# Patient Record
Sex: Female | Born: 1975 | Race: White | Hispanic: No | Smoking: Current every day smoker
Health system: Southern US, Community
[De-identification: ages and names within clinical notes are randomized; demographics above are authoritative.]

## PROBLEM LIST (undated history)

## (undated) DIAGNOSIS — M542 Cervicalgia: Secondary | ICD-10-CM

## (undated) DIAGNOSIS — N390 Urinary tract infection, site not specified: Secondary | ICD-10-CM

## (undated) DIAGNOSIS — M5410 Radiculopathy, site unspecified: Secondary | ICD-10-CM

## (undated) DIAGNOSIS — G8929 Other chronic pain: Secondary | ICD-10-CM

## (undated) DIAGNOSIS — J189 Pneumonia, unspecified organism: Secondary | ICD-10-CM

## (undated) DIAGNOSIS — F4310 Post-traumatic stress disorder, unspecified: Secondary | ICD-10-CM

## (undated) DIAGNOSIS — F988 Other specified behavioral and emotional disorders with onset usually occurring in childhood and adolescence: Secondary | ICD-10-CM

## (undated) DIAGNOSIS — M1990 Unspecified osteoarthritis, unspecified site: Secondary | ICD-10-CM

## (undated) DIAGNOSIS — M549 Dorsalgia, unspecified: Secondary | ICD-10-CM

## (undated) HISTORY — PX: TUBAL LIGATION: SHX77

## (undated) HISTORY — PX: ABDOMINAL HYSTERECTOMY: SHX81

---

## 2000-12-27 ENCOUNTER — Other Ambulatory Visit: Admission: RE | Admit: 2000-12-27 | Discharge: 2000-12-27 | Payer: Self-pay | Admitting: *Deleted

## 2001-05-12 ENCOUNTER — Emergency Department (HOSPITAL_COMMUNITY): Admission: EM | Admit: 2001-05-12 | Discharge: 2001-05-12 | Payer: Self-pay | Admitting: Emergency Medicine

## 2001-09-08 ENCOUNTER — Emergency Department (HOSPITAL_COMMUNITY): Admission: EM | Admit: 2001-09-08 | Discharge: 2001-09-09 | Payer: Self-pay

## 2001-09-15 ENCOUNTER — Emergency Department (HOSPITAL_COMMUNITY): Admission: EM | Admit: 2001-09-15 | Discharge: 2001-09-15 | Payer: Self-pay | Admitting: Internal Medicine

## 2001-09-15 ENCOUNTER — Encounter: Payer: Self-pay | Admitting: Internal Medicine

## 2001-12-27 ENCOUNTER — Other Ambulatory Visit: Admission: RE | Admit: 2001-12-27 | Discharge: 2001-12-27 | Payer: Self-pay | Admitting: *Deleted

## 2002-01-25 ENCOUNTER — Emergency Department (HOSPITAL_COMMUNITY): Admission: EM | Admit: 2002-01-25 | Discharge: 2002-01-25 | Payer: Self-pay | Admitting: Emergency Medicine

## 2002-02-08 ENCOUNTER — Emergency Department (HOSPITAL_COMMUNITY): Admission: EM | Admit: 2002-02-08 | Discharge: 2002-02-08 | Payer: Self-pay | Admitting: Internal Medicine

## 2002-02-28 ENCOUNTER — Emergency Department (HOSPITAL_COMMUNITY): Admission: EM | Admit: 2002-02-28 | Discharge: 2002-02-28 | Payer: Self-pay | Admitting: *Deleted

## 2002-02-28 ENCOUNTER — Encounter: Payer: Self-pay | Admitting: *Deleted

## 2002-08-23 ENCOUNTER — Emergency Department (HOSPITAL_COMMUNITY): Admission: EM | Admit: 2002-08-23 | Discharge: 2002-08-23 | Payer: Self-pay | Admitting: Emergency Medicine

## 2002-10-17 ENCOUNTER — Other Ambulatory Visit: Admission: RE | Admit: 2002-10-17 | Discharge: 2002-10-17 | Payer: Self-pay | Admitting: Obstetrics and Gynecology

## 2003-03-12 ENCOUNTER — Ambulatory Visit (HOSPITAL_COMMUNITY): Admission: AD | Admit: 2003-03-12 | Discharge: 2003-03-12 | Payer: Self-pay | Admitting: Internal Medicine

## 2003-03-15 ENCOUNTER — Ambulatory Visit (HOSPITAL_COMMUNITY): Admission: AD | Admit: 2003-03-15 | Discharge: 2003-03-15 | Payer: Self-pay | Admitting: Obstetrics and Gynecology

## 2003-03-20 ENCOUNTER — Ambulatory Visit (HOSPITAL_COMMUNITY): Admission: AD | Admit: 2003-03-20 | Discharge: 2003-03-20 | Payer: Self-pay | Admitting: Obstetrics and Gynecology

## 2003-03-26 ENCOUNTER — Ambulatory Visit (HOSPITAL_COMMUNITY): Admission: AD | Admit: 2003-03-26 | Discharge: 2003-03-26 | Payer: Self-pay | Admitting: Obstetrics and Gynecology

## 2003-03-29 ENCOUNTER — Ambulatory Visit (HOSPITAL_COMMUNITY): Admission: AD | Admit: 2003-03-29 | Discharge: 2003-03-29 | Payer: Self-pay | Admitting: Obstetrics and Gynecology

## 2003-04-01 ENCOUNTER — Ambulatory Visit (HOSPITAL_COMMUNITY): Admission: AD | Admit: 2003-04-01 | Discharge: 2003-04-01 | Payer: Self-pay | Admitting: Obstetrics and Gynecology

## 2003-04-03 ENCOUNTER — Inpatient Hospital Stay (HOSPITAL_COMMUNITY): Admission: AD | Admit: 2003-04-03 | Discharge: 2003-04-06 | Payer: Self-pay | Admitting: Obstetrics and Gynecology

## 2003-05-14 ENCOUNTER — Other Ambulatory Visit: Admission: RE | Admit: 2003-05-14 | Discharge: 2003-05-14 | Payer: Self-pay | Admitting: Obstetrics & Gynecology

## 2003-08-21 ENCOUNTER — Ambulatory Visit (HOSPITAL_COMMUNITY): Admission: RE | Admit: 2003-08-21 | Discharge: 2003-08-21 | Payer: Self-pay | Admitting: Internal Medicine

## 2004-01-07 ENCOUNTER — Ambulatory Visit: Payer: Self-pay | Admitting: Family Medicine

## 2004-01-24 ENCOUNTER — Ambulatory Visit (HOSPITAL_COMMUNITY): Admission: RE | Admit: 2004-01-24 | Discharge: 2004-01-24 | Payer: Self-pay | Admitting: Family Medicine

## 2004-02-11 ENCOUNTER — Ambulatory Visit: Payer: Self-pay | Admitting: Family Medicine

## 2004-03-07 ENCOUNTER — Ambulatory Visit (HOSPITAL_COMMUNITY): Admission: RE | Admit: 2004-03-07 | Discharge: 2004-03-07 | Payer: Self-pay | Admitting: Family Medicine

## 2004-03-10 ENCOUNTER — Ambulatory Visit (HOSPITAL_COMMUNITY): Admission: RE | Admit: 2004-03-10 | Discharge: 2004-03-10 | Payer: Self-pay | Admitting: Family Medicine

## 2004-03-11 ENCOUNTER — Ambulatory Visit: Payer: Self-pay | Admitting: Family Medicine

## 2004-06-09 ENCOUNTER — Ambulatory Visit: Payer: Self-pay | Admitting: Family Medicine

## 2004-07-10 ENCOUNTER — Ambulatory Visit: Payer: Self-pay | Admitting: Family Medicine

## 2005-05-28 ENCOUNTER — Ambulatory Visit (HOSPITAL_COMMUNITY): Admission: RE | Admit: 2005-05-28 | Discharge: 2005-05-28 | Payer: Self-pay | Admitting: Obstetrics and Gynecology

## 2006-02-26 ENCOUNTER — Ambulatory Visit: Payer: Self-pay | Admitting: Family Medicine

## 2006-03-04 ENCOUNTER — Encounter: Payer: Self-pay | Admitting: Family Medicine

## 2006-03-04 LAB — CONVERTED CEMR LAB
Albumin: 4.4 g/dL (ref 3.5–5.2)
Alkaline Phosphatase: 85 units/L (ref 39–117)
Basophils Absolute: 0 10*3/uL (ref 0.0–0.1)
CO2: 20 meq/L (ref 19–32)
Chloride: 108 meq/L (ref 96–112)
Creatinine, Ser: 0.62 mg/dL (ref 0.40–1.20)
HDL: 39 mg/dL — ABNORMAL LOW (ref >39)
Hemoglobin: 13.9 g/dL (ref 12.0–15.0)
LDL Cholesterol: 91 mg/dL (ref 0–99)
Lymphocytes Relative: 22 % (ref 12–46)
Monocytes Absolute: 0.4 10*3/uL (ref 0.2–0.7)
Monocytes Relative: 5 % (ref 3–11)
Neutro Abs: 5.5 10*3/uL (ref 1.7–7.7)
Potassium: 4.5 meq/L (ref 3.5–5.3)
RBC: 4.47 M/uL (ref 3.87–5.11)
RDW: 12.5 % (ref 11.5–14.0)
TSH: 1.482 microintl units/mL (ref 0.350–5.50)
Total Bilirubin: 0.4 mg/dL (ref 0.3–1.2)
VLDL: 15 mg/dL (ref 0–40)

## 2006-04-17 ENCOUNTER — Emergency Department (HOSPITAL_COMMUNITY): Admission: EM | Admit: 2006-04-17 | Discharge: 2006-04-17 | Payer: Self-pay | Admitting: Emergency Medicine

## 2006-05-27 ENCOUNTER — Emergency Department (HOSPITAL_COMMUNITY): Admission: EM | Admit: 2006-05-27 | Discharge: 2006-05-27 | Payer: Self-pay | Admitting: Emergency Medicine

## 2006-06-02 ENCOUNTER — Ambulatory Visit: Payer: Self-pay | Admitting: Orthopedic Surgery

## 2006-06-21 ENCOUNTER — Ambulatory Visit: Payer: Self-pay | Admitting: Orthopedic Surgery

## 2006-06-23 ENCOUNTER — Ambulatory Visit (HOSPITAL_COMMUNITY): Admission: RE | Admit: 2006-06-23 | Discharge: 2006-06-23 | Payer: Self-pay | Admitting: Orthopedic Surgery

## 2006-06-30 ENCOUNTER — Ambulatory Visit: Payer: Self-pay | Admitting: Orthopedic Surgery

## 2006-10-23 ENCOUNTER — Emergency Department (HOSPITAL_COMMUNITY): Admission: EM | Admit: 2006-10-23 | Discharge: 2006-10-24 | Payer: Self-pay | Admitting: Emergency Medicine

## 2006-12-28 ENCOUNTER — Encounter: Admission: RE | Admit: 2006-12-28 | Discharge: 2006-12-28 | Payer: Self-pay | Admitting: Family Medicine

## 2007-01-20 ENCOUNTER — Encounter: Payer: Self-pay | Admitting: Family Medicine

## 2007-06-09 ENCOUNTER — Emergency Department (HOSPITAL_COMMUNITY): Admission: EM | Admit: 2007-06-09 | Discharge: 2007-06-09 | Payer: Self-pay | Admitting: Emergency Medicine

## 2007-07-15 ENCOUNTER — Emergency Department (HOSPITAL_COMMUNITY): Admission: EM | Admit: 2007-07-15 | Discharge: 2007-07-15 | Payer: Self-pay | Admitting: Emergency Medicine

## 2007-09-11 ENCOUNTER — Emergency Department (HOSPITAL_COMMUNITY): Admission: EM | Admit: 2007-09-11 | Discharge: 2007-09-11 | Payer: Self-pay | Admitting: Emergency Medicine

## 2007-10-06 ENCOUNTER — Encounter
Admission: RE | Admit: 2007-10-06 | Discharge: 2007-10-10 | Payer: Self-pay | Admitting: Physical Medicine & Rehabilitation

## 2007-10-10 ENCOUNTER — Ambulatory Visit: Payer: Self-pay | Admitting: Physical Medicine & Rehabilitation

## 2008-01-25 ENCOUNTER — Encounter: Payer: Self-pay | Admitting: Family Medicine

## 2008-02-20 ENCOUNTER — Encounter: Payer: Self-pay | Admitting: Family Medicine

## 2008-05-02 ENCOUNTER — Emergency Department (HOSPITAL_COMMUNITY): Admission: EM | Admit: 2008-05-02 | Discharge: 2008-05-02 | Payer: Self-pay | Admitting: Emergency Medicine

## 2008-07-19 ENCOUNTER — Emergency Department (HOSPITAL_COMMUNITY): Admission: EM | Admit: 2008-07-19 | Discharge: 2008-07-19 | Payer: Self-pay | Admitting: Emergency Medicine

## 2008-08-07 ENCOUNTER — Emergency Department (HOSPITAL_COMMUNITY): Admission: EM | Admit: 2008-08-07 | Discharge: 2008-08-07 | Payer: Self-pay | Admitting: Emergency Medicine

## 2008-10-26 ENCOUNTER — Emergency Department (HOSPITAL_COMMUNITY): Admission: EM | Admit: 2008-10-26 | Discharge: 2008-10-26 | Payer: Self-pay | Admitting: Emergency Medicine

## 2008-12-25 ENCOUNTER — Emergency Department (HOSPITAL_COMMUNITY): Admission: EM | Admit: 2008-12-25 | Discharge: 2008-12-25 | Payer: Self-pay | Admitting: Emergency Medicine

## 2009-10-16 ENCOUNTER — Emergency Department (HOSPITAL_COMMUNITY): Admission: EM | Admit: 2009-10-16 | Discharge: 2009-10-16 | Payer: Self-pay | Admitting: Emergency Medicine

## 2009-11-13 ENCOUNTER — Ambulatory Visit (HOSPITAL_COMMUNITY): Admission: RE | Admit: 2009-11-13 | Discharge: 2009-11-13 | Payer: Self-pay | Admitting: Neurology

## 2010-01-02 ENCOUNTER — Emergency Department (HOSPITAL_COMMUNITY)
Admission: EM | Admit: 2010-01-02 | Discharge: 2010-01-03 | Payer: Self-pay | Source: Home / Self Care | Admitting: Emergency Medicine

## 2010-01-22 ENCOUNTER — Emergency Department (HOSPITAL_COMMUNITY)
Admission: EM | Admit: 2010-01-22 | Discharge: 2010-01-22 | Payer: Self-pay | Source: Home / Self Care | Admitting: Emergency Medicine

## 2010-02-18 NOTE — Letter (Signed)
Summary: RPC chart  RPC chart   Imported By: Curtis Sites 08/27/2009 10:53:36  _____________________________________________________________________  External Attachment:    Type:   Image     Comment:   External Document

## 2010-04-22 LAB — CBC
HCT: 36.4 % (ref 36.0–46.0)
Hemoglobin: 12.5 g/dL (ref 12.0–15.0)
RBC: 4 MIL/uL (ref 3.87–5.11)
WBC: 8.8 10*3/uL (ref 4.0–10.5)

## 2010-04-22 LAB — BASIC METABOLIC PANEL
Calcium: 9.1 mg/dL (ref 8.4–10.5)
GFR calc Af Amer: >60 mL/min (ref >60)
GFR calc non Af Amer: >60 mL/min (ref >60)
Potassium: 4.3 mEq/L (ref 3.5–5.1)
Sodium: 139 mEq/L (ref 135–145)

## 2010-04-22 LAB — DIFFERENTIAL
Eosinophils Relative: 2 % (ref 0–5)
Lymphocytes Relative: 12 % (ref 12–46)
Lymphs Abs: 1.1 10*3/uL (ref 0.7–4.0)
Monocytes Absolute: 0.3 10*3/uL (ref 0.1–1.0)
Neutro Abs: 7.3 10*3/uL (ref 1.7–7.7)

## 2010-04-27 LAB — URINALYSIS, ROUTINE W REFLEX MICROSCOPIC
Glucose, UA: NEGATIVE mg/dL
Hgb urine dipstick: NEGATIVE
Leukocytes, UA: NEGATIVE
Specific Gravity, Urine: 1.025 (ref 1.005–1.030)
Urobilinogen, UA: 1 mg/dL (ref 0.0–1.0)

## 2010-04-27 LAB — URINE MICROSCOPIC-ADD ON
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
## 2010-06-06 NOTE — H&P (Signed)
NAME:  Linda Calderon, Linda Calderon                              ACCOUNT NO.:  0   MEDICAL RECORD NO.:  [id number]                     PATIENT TYPE:   LOCATION:                                       FACILITY:   PHYSICIAN:  Lazaro Arms, M.D.                DATE OF BIRTH:  [birth year]   DATE OF ADMISSION:  04/03/2003  DATE OF DISCHARGE:                                HISTORY & PHYSICAL   HISTORY OF PRESENT ILLNESS:  Linda Calderon is a 35 year old white female gravida 7,  para 0 with six pregnancy losses who is admitted to Labor & Delivery for  cervical ripening and induction of labor.  Linda Calderon has been followed for the  last several weeks because of poor fetal growth.  She has been borderline  10th percentile for the last several visits.  However, in this visit we have  seen a decrease in her head-to-abdomen ratio and some fall-off in head  growth.  In addition, she is clearly less than the 10th percentile now in  fetal growth.  At 38 weeks, as a result of this, we are gong to proceed with  cervical ripening and induction of labor.  Her cervix is long, thick and  closed.  The baby is very low but the cervix is very unfavorable and will  not allow passage of Foley.   PAST MEDICAL HISTORY:  Significant for anemia.   PAST SURGICAL HISTORY:  She had a D&C in 1999 by Dr. Lisette Grinder for a  miscarriage.   PAST OB:  As above.   ALLERGIES:  NONE.   MEDICATIONS:  Vitamins and iron.   REVIEW OF SYSTEMS:  Otherwise negative.   Blood type O positive.  Antibody negative.  Serology nonreactive.  Hepatitis  B negative.  HIV negative.  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  She will  undergo a colposcopy once again after six weeks postpartum.  GC and  Chlamydia were negative.  AFP was normal.  Her Group B Strep was negative.  Repeat GC and Chlamydia were normal.  Her 1-hour Glucola was elevated but  her 3-hour GGT was 73, 149, 136, and 114, all values were normal.   PHYSICAL EXAMINATION:  HEENT:  Unremarkable.  THYROID:   Normal.  LUNGS:  Clear.  HEART:  Regular rhythm without murmur, rubs or gallop.  BREASTS:  Deferred.  ABDOMEN:  Fundal height of 31 cm.  CERVIX:  Long, thick and closed with fetal vertex being low.  EXTREMITIES:  Warm with no edema.   IMPRESSION:  1. Intrauterine pregnancy at [redacted] weeks gestation.  2. Intrauterine growth restriction, estimated fetal weight less than 10th     percentile.  3. Unfavorable cervix.   PLAN:  Patient is admitted for cervical ripening with Cervidil to be  followed by Pitocin induction the following morning.  She understands the  indications and agrees with proceeding.  ___________________________________________                                         Lazaro Arms, M.D.   Linda Calderon  D:  04/02/2003  T:  04/02/2003  Job:  [id number]

## 2010-06-06 NOTE — Op Note (Signed)
NAME:  Linda Calderon, Linda Calderon                            ACCOUNT NO.:  [id number]   MEDICAL RECORD NO.:  [id number]                   PATIENT TYPE:  INP   LOCATION:  LDR1                                 FACILITY:  [facility]   PHYSICIAN:  Tilda Burrow, M.D.              DATE OF BIRTH:  [birth year]   DATE OF PROCEDURE:  DATE OF DISCHARGE:                                 OPERATIVE REPORT   DELIVERY SUMMARY   DATE OF DELIVERY:  April 04, 2003 at 2:53 p.m.   ONSET OF LABOR:  April 04, 2003 at 7 a.m.   LENGTH OF FIRST STAGE LABOR:  7 hours and 30 minutes.   LENGTH OF SECOND STAGE LABOR:  18 minutes.   LENGTH OF THIRD STAGE LABOR:  11 minutes.   DELIVERY NOTE:  Linda Calderon had a normal spontaneous vaginal delivery of a viable  female infant with Apgars of 9 and 9 with a weight of 5 pounds 2 ounces over  an intact perineum.  First stage of labor was actively managed with 20 units  of Pitocin in 1000 cc an hour at a rapid rate.   The placenta was delivered spontaneously via Tomasa Blase mechanism, a 3-vessel  cord was noted upon inspection.  Membranes were noted to be intact upon  inspection.  Perineum was intact upon inspection.  Estimated blood loss was  approximately 300 cc.  Infant and mother both stabilized, in good condition,  and transferred out to postpartum unit.     ________________________________________  ___________________________________________  Zerita Boers, N.M.                       Tilda Burrow, M.D.   DL/MEDQ  D:  41/66/0630  T:  04/04/2003  Job:  [id number]   cc:   [facility]

## 2010-06-06 NOTE — Op Note (Signed)
Linda Calderon, Linda Calderon                  ACCOUNT NO.:  [id number]   MEDICAL RECORD NO.:  [id number]          PATIENT TYPE:  AMB   LOCATION:  DAY                           FACILITY:  [facility]   PHYSICIAN:  Tilda Burrow, M.D. DATE OF BIRTH:  1975/11/26   DATE OF PROCEDURE:  05/28/2005  DATE OF DISCHARGE:  05/28/2005                                 OPERATIVE REPORT   PREOPERATIVE DIAGNOSIS:  Elective sterilization.   POSTOPERATIVE DIAGNOSIS:  Elective sterilization.   PROCEDURE:  Laparoscopic tubal sterilization, Falope rings.   SURGEON:  Tilda Burrow, M.D.   ASSISTANT:  None.   ANESTHESIA:  General.   COMPLICATIONS:  None.   FINDINGS:  Thin, filmy adhesions right paratubal area, able to be released.  Otherwise normal exam.   DETAILS OF PROCEDURE:  The patient was taken to the operating room, prepped  and draped in the usual standard fashion with legs in low lithotomy leg  supports after general anesthesia was introduced without difficulty.  The  bladder was in-and-out catheterized and Hulka tenaculum attached to the  cervix for uterine  manipulation.  An infraumbilical, vertical, 1-cm, skin  incision was made as well as a transverse suprapubic 1-cm incision. A Veress  needle was used to achieve pneumoperitoneum through the umbilical incision  while being careful to orient the needle toward the pelvis while elevating  the abdominal wall by manual elevation. Water droplet test was used to  confirm intraperitoneal placement.   Pneumoperitoneum was achieved easily under 8-to-10 mm of intra-abdominal  pressure; and the laparoscopic trocar was introduced, a 5-mm blunt tipped  trocar, under direct visualization using the video camera.  Peritoneal  cavity was entered without difficulty.  Inspection of the anterior surfaces  of the abdominal contents showed no evidence of injury or bleeding.  Attention was directed to the pelvis.  Findings were as described above.   Attention was  first directed to the left fallopian tube which was elevated,  identified to its fimbriated end and grasped in its midportion with Falope  ring applier.  Falope ring applied and then the tube infiltrated with  Marcaine solution 0.25% using a 22-gauge spinal needle percutaneously  applied.   Attention was then directed to the right fallopian tube where a similar  procedure was performed.  Photo documentation of the ring placements was  performed; 120 cc of saline was instilled into the abdomen; deflation of  CO2 performed; instruments removed and subcuticular 4-0 Dexon closure of  skin incisions performed.  The rest of the surgical instruments were  removed; Steri-Strips placed.  The patient allowed to awaken and go to  recovery room in standard fashion.      Tilda Burrow, M.D.  Electronically Signed     JVF/MEDQ  D:  06/09/2005  T:  06/10/2005  Job:  [id number]

## 2010-06-06 NOTE — H&P (Signed)
Linda Calderon, Linda Calderon                  ACCOUNT NO.:  [id number]   MEDICAL RECORD NO.:  [id number]          PATIENT TYPE:  AMB   LOCATION:  DAY                           FACILITY:  [facility]   PHYSICIAN:  Tilda Burrow, M.D. DATE OF BIRTH:  [birth year]   DATE OF ADMISSION:  DATE OF DISCHARGE:  LH                                HISTORY & PHYSICAL   CHIEF COMPLAINT:  Desire for elective permanent sterilization.   HISTORY OF PRESENT ILLNESS:  This is for surgery on May 28, 2005, [facility].  This is a 35 year old female, postpartum since  2005, who is admitted at this time for elective permanent sterilization.  Linda Calderon has decided on permanent sterilization.  She desires to avoid  contraception issues.  She has had breakthrough bleeding on [place] for  several months.  She has been tried on other oral contraceptives  unsuccessfully.  She finds the periods uncomfortable and crampy.  We gave  consideration to endometrial ablation at the same time but this is not an  option for her current Medicaid and the patient is not interested in  proceeding under a self-pay arrangement.  Permanency in procedure is  emphasized and the patient acknowledges understanding.   PAST MEDICAL HISTORY:  Benign.   PAST SURGICAL HISTORY:  1.  D&C, 1999.  2.  Otherwise spontaneous miscarriage x5.  3.  One spontaneous vaginal delivery,2005, without complications.   ALLERGIES:  None.   PHYSICAL EXAMINATION:  GENERAL:  She is a healthy, petite, Caucasian female,  alert, oriented x3.  HEENT:  Pupils equal, round, and reactive.  NECK:  Supple.  Trachea midline.  CHEST:  Clear to auscultation.  ABDOMEN:  Nontender.  EXTERNAL GENITALIA;  Multiparous.  Vaginal exam:  Normal secretions.  Cervix  nontender.  Uterus anteflexed.  Adnexa nontender without masses.   LABORATORY DATA:  GC and Chlamydia culture negative.   PLAN:  Laparoscopic tubal sterilization, Falope's rings, May 10.  2007.      Tilda Burrow, M.D.  Electronically Signed     JVF/MEDQ  D:  05/20/2005  T:  05/20/2005  Job:  [id number]

## 2010-10-30 LAB — URINALYSIS, ROUTINE W REFLEX MICROSCOPIC
Glucose, UA: NEGATIVE
Hgb urine dipstick: NEGATIVE
pH: 6

## 2010-12-27 ENCOUNTER — Emergency Department (HOSPITAL_COMMUNITY): Payer: Medicaid Other

## 2010-12-27 ENCOUNTER — Emergency Department (HOSPITAL_COMMUNITY)
Admission: EM | Admit: 2010-12-27 | Discharge: 2010-12-27 | Disposition: A | Discharge status: Home / Self Care | Payer: Medicaid Other | Attending: Emergency Medicine | Admitting: Emergency Medicine

## 2010-12-27 ENCOUNTER — Encounter: Payer: Self-pay | Admitting: *Deleted

## 2010-12-27 DIAGNOSIS — J111 Influenza due to unidentified influenza virus with other respiratory manifestations: Secondary | ICD-10-CM | POA: Insufficient documentation

## 2010-12-27 DIAGNOSIS — F17200 Nicotine dependence, unspecified, uncomplicated: Secondary | ICD-10-CM | POA: Insufficient documentation

## 2010-12-27 MED ORDER — ACETAMINOPHEN 500 MG PO TABS
1000.0000 mg | ORAL_TABLET | Freq: Once | ORAL | Status: AC
  Administered 2010-12-27: 1000 mg via ORAL
  Filled 2010-12-27: qty 2

## 2010-12-27 MED ORDER — BENZONATATE 100 MG PO CAPS
200.0000 mg | ORAL_CAPSULE | Freq: Once | ORAL | Status: AC
  Administered 2010-12-27: 200 mg via ORAL
  Filled 2010-12-27: qty 2

## 2010-12-27 MED ORDER — OSELTAMIVIR PHOSPHATE 75 MG PO CAPS: 75.0000 mg | ORAL_CAPSULE | Freq: Two times a day (BID) | ORAL | Status: AC

## 2010-12-27 MED ORDER — BENZONATATE 100 MG PO CAPS: 200.0000 mg | ORAL_CAPSULE | Freq: Three times a day (TID) | ORAL | Status: AC | PRN

## 2010-12-27 NOTE — ED Notes (Signed)
Pt a/ox4. Resp even and unlabored. NAD at this time. D/C instructions and rx reviewed with pt. Pt verbalized understanding. Pt ambulated to lobby with steady gate.  

## 2010-12-27 NOTE — ED Notes (Signed)
C/o fever, chills, runny nose and cough

## 2010-12-27 NOTE — ED Notes (Signed)
Pt presents with cough, fever, chills, and runny nose since last night. NAD at this time. Resp even and unlabored at this time. Pt does have a dry cough at this time.

## 2010-12-29 NOTE — ED Provider Notes (Signed)
History     CSN: [id number] Arrival date & time: 12/27/2010  3:25 PM   First MD Initiated Contact with Patient 12/27/10 1525      Chief Complaint  Patient presents with  . Cough  . Fever  . Chills    (Consider location/radiation/quality/duration/timing/severity/associated sxs/prior treatment) Patient is a 35 y.o. female presenting with cough and fever. The history is provided by the patient.  Cough This is a new problem. The current episode started 12 to 24 hours ago. The problem occurs every few minutes. The problem has not changed since onset.The cough is non-productive. The maximum temperature recorded prior to her arrival was 100 to 100.9 F. The fever has been present for less than 1 day. Associated symptoms include chills, rhinorrhea, sore throat and myalgias. Pertinent negatives include no chest pain, no headaches, no shortness of breath and no wheezing. Treatments tried: tylenol. Improvement on treatment: temporary fever reduction.  Patient reports nothing makes symptoms worse. She is a smoker. Her past medical history does not include pneumonia or asthma.  Fever Primary symptoms of the febrile illness include fever, cough and myalgias. Primary symptoms do not include headaches, wheezing, shortness of breath, abdominal pain, nausea, arthralgias or rash.  The myalgias are not associated with weakness.    History reviewed. No pertinent past medical history.  History reviewed. No pertinent past surgical history.  History reviewed. No pertinent family history.  History  Substance Use Topics  . Smoking status: Current Everyday Smoker -- 1.0 packs/day    Types: Cigarettes  . Smokeless tobacco: Not on file  . Alcohol Use: No    OB History    Grav Para Term Preterm Abortions TAB SAB Ect Mult Living                  Review of Systems  Constitutional: Positive for fever and chills.  HENT: Positive for sore throat and rhinorrhea. Negative for congestion and neck pain.     Eyes: Negative.   Respiratory: Positive for cough. Negative for chest tightness, shortness of breath and wheezing.   Cardiovascular: Negative for chest pain.  Gastrointestinal: Negative for nausea and abdominal pain.  Genitourinary: Negative.   Musculoskeletal: Positive for myalgias. Negative for joint swelling and arthralgias.  Skin: Negative.  Negative for rash and wound.  Neurological: Negative for dizziness, weakness, light-headedness, numbness and headaches.  Hematological: Negative.   Psychiatric/Behavioral: Negative.     Allergies  Review of patient's allergies indicates not on file.  Home Medications   Current Outpatient Rx  Name Route Sig Dispense Refill  . BENZONATATE 100 MG PO CAPS Oral Take 2 capsules (200 mg total) by mouth 3 (three) times daily as needed for cough. 21 capsule 0  . OSELTAMIVIR PHOSPHATE 75 MG PO CAPS Oral Take 1 capsule (75 mg total) by mouth 2 (two) times daily. 10 capsule 0    BP 104/72  Pulse 109  Temp(Src) 98.4 F (36.9 C) (Oral)  Resp 18  Ht 5\' 2"  (1.575 m)  Wt 110 lb (49.896 kg)  BMI 20.12 kg/m2  SpO2 99%  LMP 12/13/2010  Physical Exam  Nursing note and vitals reviewed. Constitutional: She is oriented to person, place, and time. She appears well-developed and well-nourished. No distress.       Appears fatigued.  HENT:  Head: Normocephalic and atraumatic.  Right Ear: External ear normal.  Left Ear: External ear normal.  Mouth/Throat: Uvula is midline, oropharynx is clear and moist and mucous membranes are normal. No oropharyngeal exudate.  Eyes: Conjunctivae are normal.  Neck: Normal range of motion.  Cardiovascular: Normal rate, regular rhythm, normal heart sounds and intact distal pulses.        VS noted.  Patient not tachy on exam.  Pulmonary/Chest: Breath sounds normal. She is in respiratory distress. She has no wheezes. She has no rales. She exhibits no tenderness.       Frequent dry cough  Abdominal: Soft. Bowel sounds are  normal. There is no tenderness.  Musculoskeletal: Normal range of motion.  Neurological: She is alert and oriented to person, place, and time.  Skin: Skin is warm and dry.  Psychiatric: She has a normal mood and affect.    ED Course  Procedures (including critical care time)  Labs Reviewed - No data to display Dg Chest 2 View  12/27/2010  *RADIOLOGY REPORT*  Clinical Data: Cough and fever.  CHEST - 2 VIEW  Comparison:  12/25/2008  Findings:  The heart size and mediastinal contours are within normal limits.  Both lungs are clear.  The visualized skeletal structures are unremarkable.  IMPRESSION: No active cardiopulmonary disease.  Original Report Authenticated By: Danae Orleans, M.D.     1. Influenza       MDM  Influenza,  Tessalon for cough,  Tamiflu.  Encouraged rest,  Tylenol,  Fluids.        Candis Musa, PA 12/29/10 1406

## 2011-01-02 NOTE — ED Provider Notes (Signed)
Evaluation and management procedures were performed by the PA/NP under my supervision/collaboration.    Felisa Bonier, MD 01/02/11 1130

## 2011-03-31 ENCOUNTER — Emergency Department (HOSPITAL_COMMUNITY)
Admission: EM | Admit: 2011-03-31 | Discharge: 2011-03-31 | Disposition: A | Discharge status: Home Health | Payer: Medicaid Other | Attending: Emergency Medicine | Admitting: Emergency Medicine

## 2011-03-31 ENCOUNTER — Emergency Department (HOSPITAL_COMMUNITY): Payer: Medicaid Other

## 2011-03-31 ENCOUNTER — Encounter (HOSPITAL_COMMUNITY): Payer: Self-pay

## 2011-03-31 DIAGNOSIS — R1032 Left lower quadrant pain: Secondary | ICD-10-CM | POA: Insufficient documentation

## 2011-03-31 DIAGNOSIS — F17200 Nicotine dependence, unspecified, uncomplicated: Secondary | ICD-10-CM | POA: Insufficient documentation

## 2011-03-31 DIAGNOSIS — N938 Other specified abnormal uterine and vaginal bleeding: Secondary | ICD-10-CM | POA: Insufficient documentation

## 2011-03-31 DIAGNOSIS — N949 Unspecified condition associated with female genital organs and menstrual cycle: Secondary | ICD-10-CM | POA: Insufficient documentation

## 2011-03-31 DIAGNOSIS — R110 Nausea: Secondary | ICD-10-CM | POA: Insufficient documentation

## 2011-03-31 DIAGNOSIS — N898 Other specified noninflammatory disorders of vagina: Secondary | ICD-10-CM | POA: Insufficient documentation

## 2011-03-31 LAB — DIFFERENTIAL
Basophils Absolute: 0 10*3/uL (ref 0.0–0.1)
Basophils Relative: 0 % (ref 0–1)
Eosinophils Absolute: 0.1 10*3/uL (ref 0.0–0.7)
Eosinophils Relative: 1 % (ref 0–5)
Lymphocytes Relative: 20 % (ref 12–46)
Lymphs Abs: 1.6 10*3/uL (ref 0.7–4.0)
Monocytes Absolute: 0.2 10*3/uL (ref 0.1–1.0)
Monocytes Relative: 2 % — ABNORMAL LOW (ref 3–12)
Neutro Abs: 6 10*3/uL (ref 1.7–7.7)
Neutrophils Relative %: 76 % (ref 43–77)

## 2011-03-31 LAB — URINE MICROSCOPIC-ADD ON

## 2011-03-31 LAB — URINALYSIS, ROUTINE W REFLEX MICROSCOPIC
Bilirubin Urine: NEGATIVE
Glucose, UA: NEGATIVE mg/dL
Ketones, ur: NEGATIVE mg/dL
Nitrite: NEGATIVE
Protein, ur: NEGATIVE mg/dL
Specific Gravity, Urine: 1.005 (ref 1.005–1.030)
Urobilinogen, UA: 0.2 mg/dL (ref 0.0–1.0)
pH: 6.5 (ref 5.0–8.0)

## 2011-03-31 LAB — COMPREHENSIVE METABOLIC PANEL
ALT: <5 U/L (ref 0–35)
AST: 11 U/L (ref 0–37)
Albumin: 3.4 g/dL — ABNORMAL LOW (ref 3.5–5.2)
Alkaline Phosphatase: 79 U/L (ref 39–117)
BUN: 7 mg/dL (ref 6–23)
CO2: 25 mEq/L (ref 19–32)
Calcium: 9.4 mg/dL (ref 8.4–10.5)
Chloride: 105 mEq/L (ref 96–112)
Creatinine, Ser: 0.68 mg/dL (ref 0.50–1.10)
GFR calc Af Amer: >90 mL/min (ref >90)
GFR calc non Af Amer: >90 mL/min (ref >90)
Glucose, Bld: 87 mg/dL (ref 70–99)
Potassium: 3.9 mEq/L (ref 3.5–5.1)
Sodium: 138 mEq/L (ref 135–145)
Total Bilirubin: 0.2 mg/dL — ABNORMAL LOW (ref 0.3–1.2)
Total Protein: 7.8 g/dL (ref 6.0–8.3)

## 2011-03-31 LAB — CBC
HCT: 36.2 % (ref 36.0–46.0)
Hemoglobin: 12.5 g/dL (ref 12.0–15.0)
MCH: 30.3 pg (ref 26.0–34.0)
MCHC: 34.5 g/dL (ref 30.0–36.0)
MCV: 87.9 fL (ref 78.0–100.0)
Platelets: 379 10*3/uL (ref 150–400)
RBC: 4.12 MIL/uL (ref 3.87–5.11)
RDW: 12.4 % (ref 11.5–15.5)
WBC: 7.9 10*3/uL (ref 4.0–10.5)

## 2011-03-31 LAB — WET PREP, GENITAL: Yeast Wet Prep HPF POC: NONE SEEN

## 2011-03-31 LAB — RPR: RPR Ser Ql: NONREACTIVE

## 2011-03-31 LAB — PREGNANCY, URINE: Preg Test, Ur: NEGATIVE

## 2011-03-31 MED ORDER — OXYCODONE-ACETAMINOPHEN 5-325 MG PO TABS: 1.0000 | ORAL_TABLET | ORAL | Status: AC | PRN

## 2011-03-31 MED ORDER — ONDANSETRON HCL 4 MG/2ML IJ SOLN
4.0000 mg | Freq: Once | INTRAMUSCULAR | Status: AC
  Administered 2011-03-31: 4 mg via INTRAVENOUS
  Filled 2011-03-31: qty 2

## 2011-03-31 MED ORDER — KETOROLAC TROMETHAMINE 30 MG/ML IJ SOLN
30.0000 mg | Freq: Once | INTRAMUSCULAR | Status: AC
  Administered 2011-03-31: 30 mg via INTRAVENOUS
  Filled 2011-03-31: qty 1

## 2011-03-31 MED ORDER — FENTANYL CITRATE 0.05 MG/ML IJ SOLN
50.0000 ug | Freq: Once | INTRAMUSCULAR | Status: AC
  Administered 2011-03-31: 50 ug via INTRAVENOUS

## 2011-03-31 MED ORDER — FENTANYL CITRATE 0.05 MG/ML IJ SOLN
INTRAMUSCULAR | Status: AC
  Administered 2011-03-31: 50 ug via INTRAVENOUS
  Filled 2011-03-31: qty 2

## 2011-03-31 NOTE — ED Notes (Signed)
Patient transported to Ultrasound 

## 2011-03-31 NOTE — Discharge Instructions (Signed)
Abnormal Uterine Bleeding Abnormal uterine bleeding can have many causes. Some cases are simply treated, while others are more serious. There are several kinds of bleeding that is considered abnormal, including:  Bleeding between periods.   Bleeding after sexual intercourse.   Spotting anytime in the menstrual cycle.   Bleeding heavier or more than normal.   Bleeding after menopause.  CAUSES  There are many causes of abnormal uterine bleeding. It can be present in teenagers, pregnant women, women during their reproductive years, and women who have reached menopause. Your caregiver will look for the more common causes depending on your age, signs, symptoms and your particular circumstance. Most cases are not serious and can be treated. Even the more serious causes, like cancer of the female organs, can be treated adequately if found in the early stages. That is why all types of bleeding should be evaluated and treated as soon as possible. DIAGNOSIS  Diagnosing the cause may take several kinds of tests. Your caregiver may:  Take a complete history of the type of bleeding.   Perform a complete physical exam and Pap smear.   Take an ultrasound on the abdomen showing a picture of the female organs and the pelvis.   Inject dye into the uterus and Fallopian tubes and X-ray them (hysterosalpingogram).   Place fluid in the uterus and do an ultrasound (sonohysterogrqphy).   Take a CT scan to examine the female organs and pelvis.   Take an MRI to examine the female organs and pelvis. There is no X-ray involved with this procedure.   Look inside the uterus with a telescope that has a light at the end (hysteroscopy).   Scrap the inside of the uterus to get tissue to examine (Dilatation and Curettage, D&C).   Look into the pelvis with a telescope that has a light at the end (laparoscopy). This is done through a very small cut (incision) in the abdomen.  TREATMENT  Treatment will depend on the  cause of the abnormal bleeding. It can include:  Doing nothing to allow the problem to take care of itself over time.   Hormone treatment.   Birth control pills.   Treating the medical condition causing the problem.   Laparoscopy.   Major or minor surgery   Destroying the lining of the uterus with electrical currant, laser, freezing or heat (uterine ablation).  HOME CARE INSTRUCTIONS   Follow your caregiver's recommendation on how to treat your problem.   See your caregiver if you missed a menstrual period and think you may be pregnant.   If you are bleeding heavily, count the number of pads/tampons you use and how often you have to change them. Tell this to your caregiver.   Avoid sexual intercourse until the problem is controlled.  SEEK MEDICAL CARE IF:   You have any kind of abnormal bleeding mentioned above.   You feel dizzy at times.   You are 36 years old and have not had a menstrual period yet.  SEEK IMMEDIATE MEDICAL CARE IF:   You pass out.   You are changing pads/tampons every 15 to 30 minutes.   You have belly (abdominal) pain.   You have a temperature of 100 F (37.8 C) or higher.   You become sweaty or weak.   You are passing large blood clots from the vagina.   You start to feel sick to your stomach (nauseous) and throw up (vomit).  Document Released: 01/05/2005 Document Revised: 12/25/2010 Document Reviewed: 05/31/2008 [employer]   Patient Information 2012 [employer]. 

## 2011-03-31 NOTE — ED Provider Notes (Signed)
History     CSN: [id number]  Arrival date & time 03/31/11  1101   First MD Initiated Contact with Patient 03/31/11 1202      Chief Complaint  Patient presents with  . Abdominal Pain  . Vaginal Discharge    (Consider location/radiation/quality/duration/timing/severity/associated sxs/prior treatment) Patient is a 36 y.o. female presenting with abdominal pain and vaginal discharge. The history is provided by the patient.  Abdominal Pain The primary symptoms of the illness include abdominal pain, nausea and vaginal discharge. The primary symptoms of the illness do not include fever, shortness of breath or dysuria. The current episode started 13 to 24 hours ago. The onset of the illness was gradual. The problem has not changed since onset. The pain came on gradually. The abdominal pain has been unchanged since its onset. The abdominal pain is located in the LLQ. The abdominal pain does not radiate. The severity of the abdominal pain is 9/10. The abdominal pain is relieved by nothing. The abdominal pain is exacerbated by certain positions (Palpation makes worse.).  The vaginal discharge was first noticed yesterday. Vaginal discharge is a new problem. The amount of discharge is scant. Color: Manson Passey. The vaginal discharge is not associated with itching, burning or dysuria.  The patient states that she believes she is currently not pregnant. The patient has not had a change in bowel habit. Symptoms associated with the illness do not include chills, anorexia, heartburn, constipation, hematuria or back pain. Associated medical issues comments: She does report a history of ovarian cysts and states today's pain is similar to her last episode approximately 2 years ago..  Vaginal Discharge Associated symptoms include abdominal pain and nausea. Pertinent negatives include no anorexia, arthralgias, chest pain, chills, congestion, fever, headaches, joint swelling, neck pain, numbness, rash, sore throat or  weakness.    History reviewed. No pertinent past medical history.  History reviewed. No pertinent past surgical history.  No family history on file.  History  Substance Use Topics  . Smoking status: Current Everyday Smoker -- 1.0 packs/day    Types: Cigarettes  . Smokeless tobacco: Not on file  . Alcohol Use: No    OB History    Grav Para Term Preterm Abortions TAB SAB Ect Mult Living                  Review of Systems  Constitutional: Negative for fever and chills.  HENT: Negative for congestion, sore throat and neck pain.   Eyes: Negative.   Respiratory: Negative for chest tightness and shortness of breath.   Cardiovascular: Negative for chest pain.  Gastrointestinal: Positive for nausea and abdominal pain. Negative for heartburn, constipation and anorexia.  Genitourinary: Positive for vaginal discharge. Negative for dysuria, hematuria and flank pain.  Musculoskeletal: Negative for back pain, joint swelling and arthralgias.  Skin: Negative.  Negative for itching, rash and wound.  Neurological: Negative for dizziness, weakness, light-headedness, numbness and headaches.  Hematological: Negative.   Psychiatric/Behavioral: Negative.     Allergies  Morphine and related  Home Medications   Current Outpatient Rx  Name Route Sig Dispense Refill  . ALPRAZOLAM 1 MG PO TABS Oral Take 1 mg by mouth 3 (three) times daily.    . AMPHETAMINE-DEXTROAMPHET ER 20 MG PO CP24 Oral Take 20 mg by mouth every morning.    Marland Kitchen DESOGESTREL-ETHINYL ESTRADIOL 0.15-0.02/0.01 MG (21/5) PO TABS Oral Take 1 tablet by mouth daily.    . OXYCODONE-ACETAMINOPHEN 7.5-325 MG PO TABS Oral Take 1 tablet by mouth 3 (three)  times daily.    . OXYCODONE-ACETAMINOPHEN 5-325 MG PO TABS Oral Take 1 tablet by mouth every 4 (four) hours as needed for pain. 15 tablet 0    BP 106/83  Pulse 82  Temp(Src) 97.8 F (36.6 C) (Oral)  Resp 20  Ht 5\' 5"  (1.651 m)  Wt 108 lb (48.988 kg)  BMI 17.97 kg/m2  SpO2 100%   LMP 03/09/2011  Physical Exam  Nursing note and vitals reviewed. Constitutional: She is oriented to person, place, and time. She appears well-developed and well-nourished.  HENT:  Head: Normocephalic and atraumatic.  Eyes: Conjunctivae are normal.  Neck: Normal range of motion.  Cardiovascular: Normal rate, regular rhythm, normal heart sounds and intact distal pulses.   Pulmonary/Chest: Effort normal and breath sounds normal. She has no wheezes.  Abdominal: Soft. Bowel sounds are normal. There is no tenderness.  Genitourinary: Uterus normal. Cervix exhibits discharge. Cervix exhibits no motion tenderness and no friability. Right adnexum displays no mass and no tenderness. Left adnexum displays tenderness. Left adnexum displays no mass. Vaginal discharge found.  Musculoskeletal: Normal range of motion.  Neurological: She is alert and oriented to person, place, and time.  Skin: Skin is warm and dry.  Psychiatric: She has a normal mood and affect.    ED Course  Procedures (including critical care time)  Labs Reviewed  URINALYSIS, ROUTINE W REFLEX MICROSCOPIC - Abnormal; Notable for the following:    Hgb urine dipstick LARGE (*)    Leukocytes, UA TRACE (*)    All other components within normal limits  URINE MICROSCOPIC-ADD ON - Abnormal; Notable for the following:    Squamous Epithelial / LPF FEW (*)    Bacteria, UA FEW (*)    All other components within normal limits  DIFFERENTIAL - Abnormal; Notable for the following:    Monocytes Relative 2 (*)    All other components within normal limits  COMPREHENSIVE METABOLIC PANEL - Abnormal; Notable for the following:    Albumin 3.4 (*)    Total Bilirubin 0.2 (*)    All other components within normal limits  WET PREP, GENITAL - Abnormal; Notable for the following:    WBC, Wet Prep HPF POC RARE (*)    All other components within normal limits  PREGNANCY, URINE  CBC  GC/CHLAMYDIA PROBE AMP, GENITAL  RPR   US Transvaginal  Non-ob  03/31/2011  *RADIOLOGY REPORT*  Clinical Data: Left pelvic pain.  Vaginal discharge.  TRANSABDOMINAL AND TRANSVAGINAL ULTRASOUND OF PELVIS Technique:  Both transabdominal and transvaginal ultrasound examinations of the pelvis were performed. Transabdominal technique was performed for global imaging of the pelvis including uterus, ovaries, adnexal regions, and pelvic cul-de-sac.  Comparison: Report dated 09/15/2001   It was necessary to proceed with endovaginal exam following the transabdominal exam to visualize the the uterus, endometrium and ovaries and better detail.  Findings:  Uterus: Normal in size and appearance  Endometrium: Several tiny calcifications in the fundal portion of the endometrium.  Otherwise, the endometrium has a normal appearance, measuring 6.8 mm in maximum thickness transvaginally.  Right ovary:  Small number of small calcifications.  Otherwise, normal.  Left ovary: Small number of small calcifications.  Otherwise, normal.  Other findings: Prominent uterine veins, especially on the left.  IMPRESSION:  1.  Prominent uterine veins, especially on the left. 2.  Tiny endometrial and ovarian calcifications.  Original Report Authenticated By: Darrol Angel, M.D.   US Pelvis Complete  03/31/2011  *RADIOLOGY REPORT*  Clinical Data: Left pelvic pain.  Vaginal discharge.  TRANSABDOMINAL AND TRANSVAGINAL ULTRASOUND OF PELVIS Technique:  Both transabdominal and transvaginal ultrasound examinations of the pelvis were performed. Transabdominal technique was performed for global imaging of the pelvis including uterus, ovaries, adnexal regions, and pelvic cul-de-sac.  Comparison: Report dated 09/15/2001   It was necessary to proceed with endovaginal exam following the transabdominal exam to visualize the the uterus, endometrium and ovaries and better detail.  Findings:  Uterus: Normal in size and appearance  Endometrium: Several tiny calcifications in the fundal portion of the endometrium.   Otherwise, the endometrium has a normal appearance, measuring 6.8 mm in maximum thickness transvaginally.  Right ovary:  Small number of small calcifications.  Otherwise, normal.  Left ovary: Small number of small calcifications.  Otherwise, normal.  Other findings: Prominent uterine veins, especially on the left.  IMPRESSION:  1.  Prominent uterine veins, especially on the left. 2.  Tiny endometrial and ovarian calcifications.  Original Report Authenticated By: Darrol Angel, M.D.     1. Dysfunctional uterine bleeding       MDM  Referred to Dr. Emelda Fear, patient has seen in the past area however, she did express interest in obtaining a gynecologist in so was encouraged to pursue this as desired.  She was prescribed Percocet for use as needed for pain relief.  Followup per gynecology.  Candis Musa, PA 03/31/11 [phone]

## 2011-03-31 NOTE — ED Provider Notes (Signed)
Medical screening examination/treatment/procedure(s) were performed by non-physician practitioner and as supervising physician I was immediately available for consultation/collaboration.  Dorean Hiebert, MD 03/31/11 1811 

## 2011-03-31 NOTE — ED Notes (Signed)
Pt reports has noticed a brown mucousy vaginal discharge since last night.  C/O pain in LLQ since this morning.  C/O nausea, denies vomiting or diarrhea.

## 2011-04-01 LAB — GC/CHLAMYDIA PROBE AMP, GENITAL: GC Probe Amp, Genital: NEGATIVE

## 2011-12-09 ENCOUNTER — Emergency Department (HOSPITAL_COMMUNITY)
Admission: EM | Admit: 2011-12-09 | Discharge: 2011-12-10 | Disposition: A | Discharge status: Home / Self Care | Payer: Medicaid Other | Attending: Emergency Medicine | Admitting: Emergency Medicine

## 2011-12-09 ENCOUNTER — Encounter (HOSPITAL_COMMUNITY): Payer: Self-pay | Admitting: *Deleted

## 2011-12-09 DIAGNOSIS — Z79899 Other long term (current) drug therapy: Secondary | ICD-10-CM | POA: Insufficient documentation

## 2011-12-09 DIAGNOSIS — F17200 Nicotine dependence, unspecified, uncomplicated: Secondary | ICD-10-CM | POA: Insufficient documentation

## 2011-12-09 DIAGNOSIS — R197 Diarrhea, unspecified: Secondary | ICD-10-CM | POA: Insufficient documentation

## 2011-12-09 DIAGNOSIS — Z90710 Acquired absence of both cervix and uterus: Secondary | ICD-10-CM | POA: Insufficient documentation

## 2011-12-09 DIAGNOSIS — R109 Unspecified abdominal pain: Secondary | ICD-10-CM | POA: Insufficient documentation

## 2011-12-09 DIAGNOSIS — R112 Nausea with vomiting, unspecified: Secondary | ICD-10-CM | POA: Insufficient documentation

## 2011-12-09 DIAGNOSIS — R6883 Chills (without fever): Secondary | ICD-10-CM | POA: Insufficient documentation

## 2011-12-09 MED ORDER — SODIUM CHLORIDE 0.9 % IV BOLUS (SEPSIS)
1000.0000 mL | Freq: Once | INTRAVENOUS | Status: AC
  Administered 2011-12-09: 1000 mL via INTRAVENOUS

## 2011-12-09 MED ORDER — SODIUM CHLORIDE 0.9 % IV BOLUS (SEPSIS)
1000.0000 mL | Freq: Once | INTRAVENOUS | Status: AC
  Administered 2011-12-10: 1000 mL via INTRAVENOUS

## 2011-12-09 MED ORDER — KETOROLAC TROMETHAMINE 30 MG/ML IJ SOLN
30.0000 mg | Freq: Once | INTRAMUSCULAR | Status: AC
  Administered 2011-12-09: 30 mg via INTRAVENOUS
  Filled 2011-12-09: qty 1

## 2011-12-09 MED ORDER — ONDANSETRON HCL 4 MG/2ML IJ SOLN
4.0000 mg | Freq: Once | INTRAMUSCULAR | Status: AC
  Administered 2011-12-09: 4 mg via INTRAVENOUS
  Filled 2011-12-09: qty 2

## 2011-12-09 NOTE — ED Provider Notes (Addendum)
History   This chart was scribed for Vida Roller, MD by Sofie Rower, ED Scribe. The patient was seen in room [facility] and the patient's care was started at 10:56PM.     CSN: [id number]  Arrival date & time 12/09/11  2207   First MD Initiated Contact with Patient 12/09/11 2256      Chief Complaint  Patient presents with  . Emesis    (Consider location/radiation/quality/duration/timing/severity/associated sxs/prior treatment) The history is provided by the patient. No language interpreter was used.    Linda Calderon is a 36 y.o. female , who presents to the Emergency Department complaining of  sudden, Persistent, emesis (X 7), onset today (12/09/11).  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 She denies suspicious food ingestions..   The pt denies rash and any other chronic medical conditions.  The pt is a current everyday smoker (1.0 packs/day), however she does not drink alcohol.      History reviewed. No pertinent past medical history.  Past Surgical History  Procedure Date  . Abdominal hysterectomy     History reviewed. No pertinent family history.  History  Substance Use Topics  . Smoking status: Current Every Day Smoker -- 1.0 packs/day    Types: Cigarettes  . Smokeless tobacco: Not on file  . Alcohol Use: No    OB History    Grav Para Term Preterm Abortions TAB SAB Ect Mult Living                  Review of Systems  Constitutional: Positive for chills. Negative for fatigue.  HENT: Negative for congestion, sore throat and rhinorrhea.   Eyes: Negative for visual disturbance.  Respiratory: Negative for cough and shortness of breath.   Cardiovascular: Negative for chest pain and leg swelling.  Gastrointestinal: Positive for nausea, vomiting, abdominal pain  and diarrhea. Negative for blood in stool.  Genitourinary: Negative for dysuria.  Musculoskeletal: Negative for myalgias and back pain.  Skin: Negative for rash.  Neurological: Negative for dizziness and headaches.  Hematological: Does not bruise/bleed easily.  All other systems reviewed and are negative.    Allergies  Morphine and related  Home Medications   Current Outpatient Rx  Name  Route  Sig  Dispense  Refill  . ALPRAZOLAM 1 MG PO TABS   Oral   Take 1 mg by mouth 3 (three) times daily.         . AMPHETAMINE-DEXTROAMPHET ER 20 MG PO CP24   Oral   Take 20 mg by mouth every morning.          . OXYCODONE-ACETAMINOPHEN 7.5-325 MG PO TABS   Oral   Take 1 tablet by mouth 3 (three) times daily.         Marland Kitchen NAPROXEN 500 MG PO TABS   Oral   Take 1 tablet (500 mg total) by mouth 2 (two) times daily with a meal.   30 tablet   0   . ONDANSETRON 4 MG PO TBDP   Oral   Take 1 tablet (4 mg total) by mouth every 8 (eight) hours as needed for nausea.   10 tablet   0   . PROMETHAZINE HCL 25 MG PO TABS   Oral   Take 1 tablet (25 mg total) by mouth every 6 (six)  hours as needed for nausea.   12 tablet   0     BP 112/78  Pulse 90  Temp 97.3 F (36.3 C) (Oral)  Resp 20  Ht 5\' 4"  (1.626 m)  Wt 108 lb (48.988 kg)  BMI 18.54 kg/m2  SpO2 100%  LMP 03/09/2011  Physical Exam  Nursing note and vitals reviewed. Constitutional: She is oriented to person, place, and time. She appears well-developed and well-nourished. No distress.  HENT:  Head: Atraumatic.  Nose: Nose normal.       Dry mucus membranes.   Eyes: EOM are normal.  Cardiovascular: Normal rate, regular rhythm and normal heart sounds.   Pulmonary/Chest: Effort normal and breath sounds normal.  Abdominal: Soft. Bowel sounds are normal. There is tenderness ( mild left-sided tenderness, no peritoneal signs, no guarding, no masses or hepatosplenomegaly).        No peritoneal signs  Musculoskeletal: Normal range  of motion.  Neurological: She is alert and oriented to person, place, and time.  Skin: Skin is warm and dry. No rash noted.  Psychiatric: She has a normal mood and affect. Her behavior is normal.    ED Course  Procedures (including critical care time)  DIAGNOSTIC STUDIES: Oxygen Saturation is 100% on room air, normal by my interpretation.    COORDINATION OF CARE:   11:11 PM- Treatment plan concerning management of gastroenteritis discussed with patient. Pt agrees with treatment.     Labs Reviewed - No data to display No results found.   1. Nausea vomiting and diarrhea       MDM  The patient appears to be ill with a gastrointestinal illness likely a gastroenteritis. Her vital signs are normal, please see below. There is no indication for laboratory workup at this time. She has been given Zofran and 2 L of IV fluids and feels significantly better. She will be discharged home with Zofran, Phenergan and Naprosyn.  shehas been given resources for followup and appears stable at this time.  Filed Vitals:   12/10/11 0049  BP: 104/78  Pulse: 88  Temp:   Resp:        I personally performed the services described in this documentation, which was scribed in my presence. The recorded information has been reviewed and is accurate.      Vida Roller, MD 12/10/11 7829  Vida Roller, MD 12/10/11 [phone]

## 2011-12-09 NOTE — ED Notes (Signed)
NVD since 7 am,  abd pain

## 2011-12-09 NOTE — ED Notes (Signed)
Pt c/o lower abdominal pain and NVD since 7pm tonight. Pt describes pain as "cramping and turning". Pt states she feels weak. Pt denies blood in stool or vomit.

## 2011-12-10 MED ORDER — NAPROXEN 500 MG PO TABS: 500.0000 mg | ORAL_TABLET | Freq: Two times a day (BID) | ORAL | Status: DC

## 2011-12-10 MED ORDER — ONDANSETRON 4 MG PO TBDP: 4.0000 mg | ORAL_TABLET | Freq: Three times a day (TID) | ORAL | Status: DC | PRN

## 2011-12-10 MED ORDER — PROMETHAZINE HCL 25 MG PO TABS: 25.0000 mg | ORAL_TABLET | Freq: Four times a day (QID) | ORAL | Status: DC | PRN

## 2012-07-24 ENCOUNTER — Emergency Department (HOSPITAL_COMMUNITY)
Admission: EM | Admit: 2012-07-24 | Discharge: 2012-07-25 | Disposition: A | Discharge status: Home / Self Care | Payer: Medicaid Other | Attending: Emergency Medicine | Admitting: Emergency Medicine

## 2012-07-24 ENCOUNTER — Encounter (HOSPITAL_COMMUNITY): Payer: Self-pay | Admitting: Emergency Medicine

## 2012-07-24 DIAGNOSIS — IMO0001 Reserved for inherently not codable concepts without codable children: Secondary | ICD-10-CM | POA: Insufficient documentation

## 2012-07-24 DIAGNOSIS — Z79899 Other long term (current) drug therapy: Secondary | ICD-10-CM | POA: Insufficient documentation

## 2012-07-24 DIAGNOSIS — J020 Streptococcal pharyngitis: Secondary | ICD-10-CM | POA: Insufficient documentation

## 2012-07-24 DIAGNOSIS — R52 Pain, unspecified: Secondary | ICD-10-CM | POA: Insufficient documentation

## 2012-07-24 DIAGNOSIS — R059 Cough, unspecified: Secondary | ICD-10-CM | POA: Insufficient documentation

## 2012-07-24 DIAGNOSIS — R5381 Other malaise: Secondary | ICD-10-CM | POA: Insufficient documentation

## 2012-07-24 DIAGNOSIS — R5383 Other fatigue: Secondary | ICD-10-CM | POA: Insufficient documentation

## 2012-07-24 DIAGNOSIS — R05 Cough: Secondary | ICD-10-CM | POA: Insufficient documentation

## 2012-07-24 DIAGNOSIS — F17200 Nicotine dependence, unspecified, uncomplicated: Secondary | ICD-10-CM | POA: Insufficient documentation

## 2012-07-24 DIAGNOSIS — F988 Other specified behavioral and emotional disorders with onset usually occurring in childhood and adolescence: Secondary | ICD-10-CM | POA: Insufficient documentation

## 2012-07-24 HISTORY — DX: Other specified behavioral and emotional disorders with onset usually occurring in childhood and adolescence: F98.8

## 2012-07-24 HISTORY — DX: Post-traumatic stress disorder, unspecified: F43.10

## 2012-07-24 NOTE — ED Notes (Signed)
Patient c/o fever and sore throat that started yesterday.   States also coughing. Denies nasal congestion.

## 2012-07-25 MED ORDER — PENICILLIN G BENZATHINE 1200000 UNIT/2ML IM SUSP
1.2000 10*6.[IU] | Freq: Once | INTRAMUSCULAR | Status: AC
  Administered 2012-07-25: 1.2 10*6.[IU] via INTRAMUSCULAR
  Filled 2012-07-25: qty 2

## 2012-07-25 MED ORDER — HYDROCODONE-ACETAMINOPHEN 7.5-325 MG/15ML PO SOLN: 10.0000 mL | Freq: Four times a day (QID) | ORAL | Status: DC | PRN

## 2012-07-25 MED ORDER — IBUPROFEN 800 MG PO TABS: 800.0000 mg | ORAL_TABLET | Freq: Three times a day (TID) | ORAL | Status: DC

## 2012-07-25 NOTE — ED Provider Notes (Signed)
History    CSN: [id number] Arrival date & time 07/24/12  2300  First MD Initiated Contact with Patient 07/24/12 2331     Chief Complaint  Patient presents with  . Fever  . Sore Throat   (Consider location/radiation/quality/duration/timing/severity/associated sxs/prior Treatment) HPI Comments: Linda Calderon is a 37 y.o. female who presents to the Emergency Department complaining of fever, generalized body aches and sore throat.  Symptoms began yesterday.  Also reports occasional non-productive cough.  Took tylenol earlier without relief.  She denies abdominal pain, vomiting, rash, recent tick bite, neck pain or stiffness or shortness of breath  Patient is a 37 y.o. female presenting with fever and pharyngitis. The history is provided by the patient.  Fever Associated symptoms: chills, cough, myalgias and sore throat   Associated symptoms: no confusion, no congestion, no dysuria, no headaches, no nausea, no rash, no rhinorrhea and no vomiting   Sore Throat Associated symptoms include chills, coughing, fatigue, a fever, myalgias and a sore throat. Pertinent negatives include no abdominal pain, congestion, headaches, nausea, neck pain, numbness, rash, vomiting or weakness.   Past Medical History  Diagnosis Date  . ADD (attention deficit disorder)   . PTSD (post-traumatic stress disorder)    Past Surgical History  Procedure Laterality Date  . Abdominal hysterectomy     No family history on file. History  Substance Use Topics  . Smoking status: Current Every Day Smoker -- 1.00 packs/day    Types: Cigarettes  . Smokeless tobacco: Not on file  . Alcohol Use: Yes   OB History   Grav Para Term Preterm Abortions TAB SAB Ect Mult Living                 Review of Systems  Constitutional: Positive for fever, chills and fatigue. Negative for activity change and appetite change.  HENT: Positive for sore throat. Negative for congestion, facial swelling, rhinorrhea, trouble swallowing,  neck pain, neck stiffness and sinus pressure.   Eyes: Negative for visual disturbance.  Respiratory: Positive for cough. Negative for chest tightness, shortness of breath, wheezing and stridor.   Gastrointestinal: Negative for nausea, vomiting and abdominal pain.  Genitourinary: Negative for dysuria.  Musculoskeletal: Positive for myalgias.  Skin: Negative.  Negative for color change and rash.  Neurological: Negative for dizziness, weakness, numbness and headaches.  Hematological: Negative for adenopathy.  Psychiatric/Behavioral: Negative for confusion.  All other systems reviewed and are negative.    Allergies  Morphine and related  Home Medications   Current Outpatient Rx  Name  Route  Sig  Dispense  Refill  . ALPRAZolam (XANAX) 1 MG tablet   Oral   Take 1 mg by mouth 3 (three) times daily.         Marland Kitchen amphetamine-dextroamphetamine (ADDERALL XR) 20 MG 24 hr capsule   Oral   Take 20 mg by mouth every morning.          . naproxen (NAPROSYN) 500 MG tablet   Oral   Take 1 tablet (500 mg total) by mouth 2 (two) times daily with a meal.   30 tablet   0   . ondansetron (ZOFRAN ODT) 4 MG disintegrating tablet   Oral   Take 1 tablet (4 mg total) by mouth every 8 (eight) hours as needed for nausea.   10 tablet   0   . oxyCODONE-acetaminophen (PERCOCET) 7.5-325 MG per tablet   Oral   Take 1 tablet by mouth 3 (three) times daily.         Marland Kitchen  promethazine (PHENERGAN) 25 MG tablet   Oral   Take 1 tablet (25 mg total) by mouth every 6 (six) hours as needed for nausea.   12 tablet   0    BP 119/77  Pulse 100  Temp(Src) 99 F (37.2 C) (Oral)  Resp 18  Ht 5\' 4"  (1.626 m)  Wt 104 lb (47.174 kg)  BMI 17.84 kg/m2  SpO2 99%  LMP 03/09/2011 Physical Exam  Nursing note and vitals reviewed. Constitutional: She is oriented to person, place, and time. She appears well-developed and well-nourished. No distress.  HENT:  Head: Normocephalic and atraumatic. No trismus in the  jaw.  Right Ear: Tympanic membrane and ear canal normal.  Left Ear: Tympanic membrane and ear canal normal.  Mouth/Throat: Uvula is midline and mucous membranes are normal. No edematous. Posterior oropharyngeal edema and posterior oropharyngeal erythema present. No oropharyngeal exudate or tonsillar abscesses.  Erythema and edema of the bilateral tonsils.  Uvula is midline.  No exudates, no PTA.  Neck: Normal range of motion and phonation normal. Neck supple. No Brudzinski's sign and no Kernig's sign noted.  Cardiovascular: Normal rate, regular rhythm, normal heart sounds and intact distal pulses.   No murmur heard. Pulmonary/Chest: Effort normal and breath sounds normal. No respiratory distress.  Abdominal: Soft. She exhibits no distension. There is no tenderness. There is no rebound and no guarding.  Musculoskeletal: Normal range of motion.  Lymphadenopathy:    She has cervical adenopathy.       Right cervical: Superficial cervical adenopathy present.       Left cervical: Superficial cervical adenopathy present.  Neurological: She is alert and oriented to person, place, and time. She exhibits normal muscle tone. Coordination normal.  Skin: Skin is warm and dry.    ED Course  Procedures (including critical care time) Labs Reviewed  RAPID STREP SCREEN - Abnormal; Notable for the following:    Streptococcus, Group A Screen (Direct) POSITIVE (*)    All other components within normal limits     MDM  Patient is non-toxic appearing.  Airway patent.  Handles secretions well.  Bicillin LA given in dept.  VSS.  Pt appears stable for discharge.  Agrees to rest, fluids, ibuprofen for fever and f/u with her PMD if needed  Nayden Czajka L. Trisha Mangle, PA-C 07/25/12 1610

## 2012-07-30 NOTE — ED Provider Notes (Signed)
Medical screening examination/treatment/procedure(s) were performed by non-physician practitioner and as supervising physician I was immediately available for consultation/collaboration.  Nicoletta Dress. Colon Branch, MD 07/30/12 0500

## 2012-09-12 DIAGNOSIS — M5431 Sciatica, right side: Secondary | ICD-10-CM | POA: Insufficient documentation

## 2012-09-12 DIAGNOSIS — G8929 Other chronic pain: Secondary | ICD-10-CM | POA: Insufficient documentation

## 2012-11-13 ENCOUNTER — Encounter (HOSPITAL_COMMUNITY): Payer: Self-pay | Admitting: Emergency Medicine

## 2012-11-13 ENCOUNTER — Emergency Department (HOSPITAL_COMMUNITY)
Admission: EM | Admit: 2012-11-13 | Discharge: 2012-11-13 | Disposition: A | Discharge status: Home / Self Care | Payer: Medicaid Other | Attending: Emergency Medicine | Admitting: Emergency Medicine

## 2012-11-13 DIAGNOSIS — K921 Melena: Secondary | ICD-10-CM | POA: Insufficient documentation

## 2012-11-13 DIAGNOSIS — R109 Unspecified abdominal pain: Secondary | ICD-10-CM

## 2012-11-13 DIAGNOSIS — F17200 Nicotine dependence, unspecified, uncomplicated: Secondary | ICD-10-CM | POA: Insufficient documentation

## 2012-11-13 DIAGNOSIS — F988 Other specified behavioral and emotional disorders with onset usually occurring in childhood and adolescence: Secondary | ICD-10-CM | POA: Insufficient documentation

## 2012-11-13 DIAGNOSIS — R1110 Vomiting, unspecified: Secondary | ICD-10-CM | POA: Insufficient documentation

## 2012-11-13 DIAGNOSIS — Z90710 Acquired absence of both cervix and uterus: Secondary | ICD-10-CM | POA: Insufficient documentation

## 2012-11-13 DIAGNOSIS — R1084 Generalized abdominal pain: Secondary | ICD-10-CM | POA: Insufficient documentation

## 2012-11-13 DIAGNOSIS — R42 Dizziness and giddiness: Secondary | ICD-10-CM | POA: Insufficient documentation

## 2012-11-13 DIAGNOSIS — R197 Diarrhea, unspecified: Secondary | ICD-10-CM | POA: Insufficient documentation

## 2012-11-13 DIAGNOSIS — Z79899 Other long term (current) drug therapy: Secondary | ICD-10-CM | POA: Insufficient documentation

## 2012-11-13 LAB — COMPREHENSIVE METABOLIC PANEL
ALT: 9 U/L (ref 0–35)
Albumin: 3.9 g/dL (ref 3.5–5.2)
Alkaline Phosphatase: 74 U/L (ref 39–117)
BUN: 10 mg/dL (ref 6–23)
Calcium: 9.7 mg/dL (ref 8.4–10.5)
GFR calc Af Amer: >90 mL/min (ref >90)
Glucose, Bld: 99 mg/dL (ref 70–99)
Potassium: 4.1 mEq/L (ref 3.5–5.1)
Sodium: 138 mEq/L (ref 135–145)
Total Protein: 7.5 g/dL (ref 6.0–8.3)

## 2012-11-13 LAB — URINALYSIS, ROUTINE W REFLEX MICROSCOPIC
Bilirubin Urine: NEGATIVE
Hgb urine dipstick: NEGATIVE
Ketones, ur: NEGATIVE mg/dL
Nitrite: NEGATIVE
Protein, ur: NEGATIVE mg/dL
Specific Gravity, Urine: 1.01 (ref 1.005–1.030)
Urobilinogen, UA: 0.2 mg/dL (ref 0.0–1.0)

## 2012-11-13 LAB — CBC WITH DIFFERENTIAL/PLATELET
Basophils Relative: 0 % (ref 0–1)
Eosinophils Absolute: 0.1 10*3/uL (ref 0.0–0.7)
Eosinophils Relative: 1 % (ref 0–5)
MCH: 31.6 pg (ref 26.0–34.0)
MCHC: 34.3 g/dL (ref 30.0–36.0)
MCV: 92 fL (ref 78.0–100.0)
Neutrophils Relative %: 85 % — ABNORMAL HIGH (ref 43–77)
Platelets: 340 10*3/uL (ref 150–400)
RDW: 12.4 % (ref 11.5–15.5)

## 2012-11-13 LAB — LIPASE, BLOOD: Lipase: 23 U/L (ref 11–59)

## 2012-11-13 MED ORDER — ONDANSETRON 8 MG PO TBDP: ORAL_TABLET | ORAL | Status: DC

## 2012-11-13 MED ORDER — ONDANSETRON HCL 4 MG/2ML IJ SOLN
4.0000 mg | Freq: Once | INTRAMUSCULAR | Status: AC
  Administered 2012-11-13: 4 mg via INTRAVENOUS
  Filled 2012-11-13: qty 2

## 2012-11-13 MED ORDER — METOCLOPRAMIDE HCL 10 MG PO TABS: 10.0000 mg | ORAL_TABLET | Freq: Four times a day (QID) | ORAL | Status: DC | PRN

## 2012-11-13 MED ORDER — SODIUM CHLORIDE 0.9 % IV BOLUS (SEPSIS)
2000.0000 mL | Freq: Once | INTRAVENOUS | Status: AC
  Administered 2012-11-13: 1000 mL via INTRAVENOUS
  Administered 2012-11-13: 2000 mL via INTRAVENOUS

## 2012-11-13 MED ORDER — FENTANYL CITRATE 0.05 MG/ML IJ SOLN
100.0000 ug | Freq: Once | INTRAMUSCULAR | Status: AC
  Administered 2012-11-13: 100 ug via INTRAVENOUS
  Filled 2012-11-13: qty 2

## 2012-11-13 MED ORDER — HYDROCODONE-ACETAMINOPHEN 5-325 MG PO TABS: 2.0000 | ORAL_TABLET | Freq: Four times a day (QID) | ORAL | Status: DC | PRN

## 2012-11-13 NOTE — ED Provider Notes (Signed)
CSN: [id number]     Arrival date & time 11/13/12  1009 History  This chart was scribed for Hurman Horn, MD by Blanchard Kelch, ED Scribe. The patient was seen in room APA04/APA04. Patient's care was started at 10:24 AM.     Chief Complaint  Patient presents with  . Abdominal Pain  . Dizziness    HPI  HPI Comments: Linda Calderon is a 37 y.o. female with a history of hysterectomy who presents to the Emergency Department complaining of intermittent abdominal pain with multiple spells of non-bloody vomiting and diarrhea that began yesterday. She reports some associated light-headedness. Last night there were small amounts of bright red blood present around the vomiting and diarrhea. There have been a few episodes today of non-bloody vomiting and diarrhea and continued intermittent abdominal pain. She is otherwise generally healthy.     Past Medical History  Diagnosis Date  . ADD (attention deficit disorder)   . PTSD (post-traumatic stress disorder)    Past Surgical History  Procedure Laterality Date  . Abdominal hysterectomy     History reviewed. No pertinent family history. History  Substance Use Topics  . Smoking status: Current Every Day Smoker -- 1.00 packs/day    Types: Cigarettes  . Smokeless tobacco: Not on file  . Alcohol Use: Yes     Comment: once a month/liquor   OB History   Grav Para Term Preterm Abortions TAB SAB Ect Mult Living                 Review of Systems 10 Systems reviewed and are negative for acute change except as noted in the HPI  Allergies  Morphine and related  Home Medications   Current Outpatient Rx  Name  Route  Sig  Dispense  Refill  . ALPRAZolam (XANAX) 1 MG tablet   Oral   Take 1 mg by mouth 3 (three) times daily.         Marland Kitchen amphetamine-dextroamphetamine (ADDERALL) 20 MG tablet   Oral   Take 20 mg by mouth daily.         . Aspirin-Salicylamide-Caffeine (BC HEADACHE POWDER PO)   Oral   Take 1 packet by mouth 2 (two) times  daily as needed (pain).         Marland Kitchen HYDROcodone-acetaminophen (NORCO) 5-325 MG per tablet   Oral   Take 2 tablets by mouth every 6 (six) hours as needed for pain.   6 tablet   0   . metoCLOPramide (REGLAN) 10 MG tablet   Oral   Take 1 tablet (10 mg total) by mouth every 6 (six) hours as needed (nausea/headache).   6 tablet   0   . ondansetron (ZOFRAN ODT) 8 MG disintegrating tablet      8mg  ODT q4 hours prn nausea   4 tablet   0    Triage Vitals: BP 122/83  Pulse 87  Temp(Src) 97.9 F (36.6 C) (Oral)  Resp 19  Ht 5\' 4"  (1.626 m)  Wt 104 lb (47.174 kg)  BMI 17.84 kg/m2  SpO2 98%  LMP 03/09/2011  Physical Exam  Nursing note and vitals reviewed. Constitutional:  Awake, alert, nontoxic appearance.  HENT:  Head: Atraumatic.  Eyes: Right eye exhibits no discharge. Left eye exhibits no discharge.  Neck: Neck supple.  Cardiovascular: Normal rate and regular rhythm.   No murmur heard. Pulmonary/Chest: Effort normal and breath sounds normal. No respiratory distress. She has no wheezes. She has no rales. She exhibits no tenderness.  Abdominal: Soft. Bowel sounds are normal. She exhibits no distension and no mass. There is tenderness. There is no rebound and no guarding.  Mild diffuse abdominal tenderness.  Chaperone present. Anoscope reveals rectal mucosal tear, superficial. Brown stool.  Musculoskeletal: She exhibits no tenderness.  Baseline ROM, no obvious new focal weakness.  Neurological: She is alert.  Mental status and motor strength appears baseline for patient and situation.  Skin: No rash noted.  Psychiatric: She has a normal mood and affect.    ED Course  Procedures (including critical care time) Recheck minimal diffuse Abd tenderness, Pt feels improved. 1310 DIAGNOSTIC STUDIES: Oxygen Saturation is 98% on room air, normal by my interpretation.    COORDINATION OF CARE: 10:30 AM -Will order IV fluids, Sublimaze and Zofran injections. Will order Urinalysis,  CBC, CMP, and blood lipase labs. Patient verbalizes understanding and agrees with treatment plan. Pt stable in ED with no significant deterioration in condition.Patient / Family / Caregiver informed of clinical course, understand medical decision-making process, and agree with plan.    Labs Review Labs Reviewed  CBC WITH DIFFERENTIAL - Abnormal; Notable for the following:    WBC 11.7 (*)    Neutrophils Relative % 85 (*)    Neutro Abs 9.9 (*)    Lymphocytes Relative 11 (*)    All other components within normal limits  COMPREHENSIVE METABOLIC PANEL - Abnormal; Notable for the following:    Total Bilirubin 0.2 (*)    All other components within normal limits  URINALYSIS, ROUTINE W REFLEX MICROSCOPIC  LIPASE, BLOOD   Imaging Review No results found.  EKG Interpretation   None       MDM   1. Vomiting and diarrhea   2. Abdominal pain   3. Hematochezia     I doubt any other EMC precluding discharge at this time including, but not necessarily limited to the following: Significant ongoing GI bleed. I personally performed the services described in this documentation, which was scribed in my presence. The recorded information has been reviewed and is accurate.   Hurman Horn, MD 11/20/12 2042

## 2012-11-13 NOTE — ED Notes (Signed)
Pt c/o n/v/d both with bright red blood since yesterday. Bloody diarrhea x 2 with 5 other episodes of diarrhea with no blood. N/v x 3 with one emesis with bright red blood. States is eating drinking well. Mm moist. nad at this time. States has been having episodes of dizziness and sweating that lasts approx since yesterday.

## 2013-02-07 ENCOUNTER — Emergency Department (HOSPITAL_COMMUNITY)
Admission: EM | Admit: 2013-02-07 | Discharge: 2013-02-08 | Disposition: A | Discharge status: Home / Self Care | Payer: Medicaid Other | Attending: Emergency Medicine | Admitting: Emergency Medicine

## 2013-02-07 ENCOUNTER — Encounter (HOSPITAL_COMMUNITY): Payer: Self-pay | Admitting: Emergency Medicine

## 2013-02-07 DIAGNOSIS — F988 Other specified behavioral and emotional disorders with onset usually occurring in childhood and adolescence: Secondary | ICD-10-CM | POA: Insufficient documentation

## 2013-02-07 DIAGNOSIS — R450 Nervousness: Secondary | ICD-10-CM | POA: Insufficient documentation

## 2013-02-07 DIAGNOSIS — F411 Generalized anxiety disorder: Secondary | ICD-10-CM | POA: Insufficient documentation

## 2013-02-07 DIAGNOSIS — F17200 Nicotine dependence, unspecified, uncomplicated: Secondary | ICD-10-CM | POA: Insufficient documentation

## 2013-02-07 DIAGNOSIS — Z79899 Other long term (current) drug therapy: Secondary | ICD-10-CM | POA: Insufficient documentation

## 2013-02-07 DIAGNOSIS — F4310 Post-traumatic stress disorder, unspecified: Secondary | ICD-10-CM | POA: Insufficient documentation

## 2013-02-07 DIAGNOSIS — J111 Influenza due to unidentified influenza virus with other respiratory manifestations: Secondary | ICD-10-CM | POA: Insufficient documentation

## 2013-02-07 MED ORDER — OSELTAMIVIR PHOSPHATE 75 MG PO CAPS: 75.0000 mg | ORAL_CAPSULE | Freq: Two times a day (BID) | ORAL | Status: DC

## 2013-02-07 MED ORDER — OSELTAMIVIR PHOSPHATE 75 MG PO CAPS
75.0000 mg | ORAL_CAPSULE | Freq: Once | ORAL | Status: AC
  Administered 2013-02-08: 75 mg via ORAL
  Filled 2013-02-07: qty 1

## 2013-02-07 MED ORDER — IBUPROFEN 800 MG PO TABS
800.0000 mg | ORAL_TABLET | Freq: Once | ORAL | Status: AC
  Administered 2013-02-08: 800 mg via ORAL
  Filled 2013-02-07: qty 1

## 2013-02-07 MED ORDER — ONDANSETRON 4 MG PO TBDP
4.0000 mg | ORAL_TABLET | Freq: Once | ORAL | Status: AC
  Administered 2013-02-07: 4 mg via ORAL
  Filled 2013-02-07: qty 1

## 2013-02-07 MED ORDER — ACETAMINOPHEN-CODEINE #3 300-30 MG PO TABS
2.0000 | ORAL_TABLET | Freq: Once | ORAL | Status: AC
Start: 2013-02-07 — End: 2013-02-08
  Administered 2013-02-08: 2 via ORAL
  Filled 2013-02-07: qty 2

## 2013-02-07 MED ORDER — ONDANSETRON HCL 4 MG PO TABS: 4.0000 mg | ORAL_TABLET | Freq: Four times a day (QID) | ORAL | Status: DC

## 2013-02-07 MED ORDER — ACETAMINOPHEN-CODEINE #3 300-30 MG PO TABS: 1.0000 | ORAL_TABLET | Freq: Four times a day (QID) | ORAL | Status: DC | PRN

## 2013-02-07 MED ORDER — IBUPROFEN 800 MG PO TABS: 800.0000 mg | ORAL_TABLET | Freq: Three times a day (TID) | ORAL | Status: DC

## 2013-02-07 NOTE — ED Provider Notes (Signed)
CSN: [id number]     Arrival date & time 02/07/13  2203 History   First MD Initiated Contact with Patient 02/07/13 2239     Chief Complaint  Patient presents with  . Emesis   (Consider location/radiation/quality/duration/timing/severity/associated sxs/prior Treatment) Patient is a 38 y.o. female presenting with vomiting. The history is provided by the patient.  Emesis Duration:  11 hours Timing:  Intermittent Number of daily episodes:  3 Quality:  Stomach contents Able to tolerate:  Liquids Progression:  Unchanged Chronicity:  New Recent urination:  Normal Context: not post-tussive   Relieved by:  Nothing Worsened by:  Nothing tried Associated symptoms: chills, fever, headaches and myalgias   Associated symptoms: no abdominal pain and no arthralgias   Associated symptoms comment:  Nasal congestion Risk factors: sick contacts     Past Medical History  Diagnosis Date  . ADD (attention deficit disorder)   . PTSD (post-traumatic stress disorder)    Past Surgical History  Procedure Laterality Date  . Abdominal hysterectomy     No family history on file. History  Substance Use Topics  . Smoking status: Current Every Day Smoker -- 1.00 packs/day    Types: Cigarettes  . Smokeless tobacco: Not on file  . Alcohol Use: Yes     Comment: once a month/liquor   OB History   Grav Para Term Preterm Abortions TAB SAB Ect Mult Living                 Review of Systems  Constitutional: Positive for fever, chills and fatigue. Negative for activity change.       All ROS Neg except as noted in HPI  HENT: Positive for congestion. Negative for nosebleeds.   Eyes: Negative for photophobia and discharge.  Respiratory: Negative for cough, shortness of breath and wheezing.   Cardiovascular: Negative for chest pain and palpitations.  Gastrointestinal: Positive for vomiting. Negative for abdominal pain and blood in stool.  Genitourinary: Negative for dysuria, frequency and hematuria.   Musculoskeletal: Positive for myalgias. Negative for arthralgias, back pain and neck pain.  Skin: Negative.   Neurological: Positive for headaches. Negative for dizziness, seizures and speech difficulty.  Psychiatric/Behavioral: Negative for hallucinations and confusion. The patient is nervous/anxious.     Allergies  Morphine and related  Home Medications   Current Outpatient Rx  Name  Route  Sig  Dispense  Refill  . ALPRAZolam (XANAX) 1 MG tablet   Oral   Take 1 mg by mouth 3 (three) times daily.         Marland Kitchen amphetamine-dextroamphetamine (ADDERALL) 20 MG tablet   Oral   Take 20 mg by mouth daily.         . Aspirin-Salicylamide-Caffeine (BC HEADACHE POWDER PO)   Oral   Take 1 packet by mouth 2 (two) times daily as needed (pain).         Marland Kitchen HYDROcodone-acetaminophen (NORCO) 5-325 MG per tablet   Oral   Take 2 tablets by mouth every 6 (six) hours as needed for pain.   6 tablet   0   . metoCLOPramide (REGLAN) 10 MG tablet   Oral   Take 1 tablet (10 mg total) by mouth every 6 (six) hours as needed (nausea/headache).   6 tablet   0   . ondansetron (ZOFRAN ODT) 8 MG disintegrating tablet      8mg  ODT q4 hours prn nausea   4 tablet   0    BP 121/86  Pulse 70  Temp(Src) 97.6 F (  36.4 C) (Oral)  Resp 16  Ht 5\' 4"  (1.626 m)  Wt 104 lb (47.174 kg)  BMI 17.84 kg/m2  SpO2 96%  LMP 03/09/2011 Physical Exam  Nursing note and vitals reviewed. Constitutional: She is oriented to person, place, and time. She appears well-developed and well-nourished.  Non-toxic appearance.  HENT:  Head: Normocephalic.  Right Ear: Tympanic membrane and external ear normal.  Left Ear: Tympanic membrane and external ear normal.  Nasal congestion  Eyes: EOM and lids are normal. Pupils are equal, round, and reactive to light.  Neck: Normal range of motion. Neck supple. Carotid bruit is not present.  Cardiovascular: Normal rate, regular rhythm, normal heart sounds, intact distal pulses and  normal pulses.   Pulmonary/Chest: Breath sounds normal. No respiratory distress.  Abdominal: Soft. Bowel sounds are normal. There is no tenderness. There is no guarding.  Musculoskeletal: Normal range of motion.  Lymphadenopathy:       Head (right side): No submandibular adenopathy present.       Head (left side): No submandibular adenopathy present.    She has no cervical adenopathy.  Neurological: She is alert and oriented to person, place, and time. She has normal strength. No cranial nerve deficit or sensory deficit. She exhibits normal muscle tone. Coordination normal.  Skin: Skin is warm and dry. No rash noted.  Psychiatric: She has a normal mood and affect. Her speech is normal.    ED Course  Procedures (including critical care time) Labs Review Labs Reviewed - No data to display Imaging Review No results found.  EKG Interpretation   None       MDM  No diagnosis found. **I have reviewed nursing notes, vital signs, and all appropriate lab and imaging results for this patient.*  Exam is consistent with influenza. Pt advised to increase fluids and get lots of rest. Rx for tamiflu, zofran, tylenol#3, and ibuprofen given to the patient. Pt excused from work for the next 5 days.  03/11/2011, PA-C 02/07/13 2326

## 2013-02-07 NOTE — Discharge Instructions (Signed)
Your exam is consistent with influenza. Please wash hands frequently. Please increase fluids. Use medications as suggested. Tylenol codeine may cause drowsiness, use with caution. It is important that you get lots of rest. Influenza, Adult Influenza (flu) is an infection in the mouth, nose, and throat (respiratory tract) caused by a virus. The flu can make you feel very ill. Influenza spreads easily from person to person (contagious).  HOME CARE   Only take medicines as told by your doctor.  Use a cool mist humidifier to make breathing easier.  Get plenty of rest until your fever goes away. This usually takes 3 to 4 days.  Drink enough fluids to keep your pee (urine) clear or pale yellow.  Cover your mouth and nose when you cough or sneeze.  Wash your hands well to avoid spreading the flu.  Stay home from work or school until your fever has been gone for at least 1 full day.  Get a flu shot every year. GET HELP RIGHT AWAY IF:   You have trouble breathing or feel short of breath.  Your skin or nails turn blue.  You have severe neck pain or stiffness.  You have a severe headache, facial pain, or earache.  Your fever gets worse or keeps coming back.  You feel sick to your stomach (nauseous), throw up (vomit), or have watery poop (diarrhea).  You have chest pain.  You have a deep cough that gets worse, or you cough up more thick spit (mucus). MAKE SURE YOU:   Understand these instructions.  Will watch your condition.  Will get help right away if you are not doing well or get worse. Document Released: 10/15/2007 Document Revised: 07/07/2011 Document Reviewed: 04/06/2011 [facility] Patient Information 2014 [place], [place].

## 2013-02-07 NOTE — ED Notes (Signed)
Onset of headache, body aches, vomiting 12noon

## 2013-02-10 NOTE — ED Provider Notes (Signed)
Medical screening examination/treatment/procedure(s) were performed by non-physician practitioner and as supervising physician I was immediately available for consultation/collaboration.  EKG Interpretation   None         Benny Lennert, MD 02/10/13 1506

## 2013-03-26 ENCOUNTER — Encounter (HOSPITAL_COMMUNITY): Payer: Self-pay | Admitting: Emergency Medicine

## 2013-03-26 ENCOUNTER — Emergency Department (HOSPITAL_COMMUNITY): Payer: Medicaid Other

## 2013-03-26 ENCOUNTER — Emergency Department (HOSPITAL_COMMUNITY)
Admission: EM | Admit: 2013-03-26 | Discharge: 2013-03-26 | Disposition: A | Discharge status: Home / Self Care | Payer: Medicaid Other | Attending: Emergency Medicine | Admitting: Emergency Medicine

## 2013-03-26 DIAGNOSIS — Z8701 Personal history of pneumonia (recurrent): Secondary | ICD-10-CM | POA: Insufficient documentation

## 2013-03-26 DIAGNOSIS — R110 Nausea: Secondary | ICD-10-CM | POA: Insufficient documentation

## 2013-03-26 DIAGNOSIS — Z79899 Other long term (current) drug therapy: Secondary | ICD-10-CM

## 2013-03-26 DIAGNOSIS — N12 Tubulo-interstitial nephritis, not specified as acute or chronic: Secondary | ICD-10-CM

## 2013-03-26 DIAGNOSIS — R5383 Other fatigue: Secondary | ICD-10-CM | POA: Insufficient documentation

## 2013-03-26 DIAGNOSIS — R5381 Other malaise: Secondary | ICD-10-CM

## 2013-03-26 DIAGNOSIS — J159 Unspecified bacterial pneumonia: Secondary | ICD-10-CM

## 2013-03-26 DIAGNOSIS — J189 Pneumonia, unspecified organism: Secondary | ICD-10-CM

## 2013-03-26 DIAGNOSIS — R638 Other symptoms and signs concerning food and fluid intake: Secondary | ICD-10-CM | POA: Insufficient documentation

## 2013-03-26 DIAGNOSIS — F17200 Nicotine dependence, unspecified, uncomplicated: Secondary | ICD-10-CM

## 2013-03-26 DIAGNOSIS — F988 Other specified behavioral and emotional disorders with onset usually occurring in childhood and adolescence: Secondary | ICD-10-CM | POA: Insufficient documentation

## 2013-03-26 LAB — URINALYSIS, ROUTINE W REFLEX MICROSCOPIC
Bilirubin Urine: NEGATIVE
GLUCOSE, UA: 100 mg/dL — AB
HGB URINE DIPSTICK: NEGATIVE
KETONES UR: NEGATIVE mg/dL
LEUKOCYTES UA: NEGATIVE
Nitrite: POSITIVE — AB
PROTEIN: NEGATIVE mg/dL
Specific Gravity, Urine: 1.01 (ref 1.005–1.030)
Urobilinogen, UA: 1 mg/dL (ref 0.0–1.0)
pH: 6 (ref 5.0–8.0)

## 2013-03-26 LAB — CBC
HEMATOCRIT: 36.9 % (ref 36.0–46.0)
Hemoglobin: 12.6 g/dL (ref 12.0–15.0)
MCH: 31.6 pg (ref 26.0–34.0)
MCHC: 34.1 g/dL (ref 30.0–36.0)
MCV: 92.5 fL (ref 78.0–100.0)
Platelets: 335 10*3/uL (ref 150–400)
RBC: 3.99 MIL/uL (ref 3.87–5.11)
RDW: 12.7 % (ref 11.5–15.5)
WBC: 12.6 10*3/uL — ABNORMAL HIGH (ref 4.0–10.5)

## 2013-03-26 LAB — BASIC METABOLIC PANEL
BUN: 15 mg/dL (ref 6–23)
CALCIUM: 9.4 mg/dL (ref 8.4–10.5)
CO2: 25 mEq/L (ref 19–32)
CREATININE: 0.9 mg/dL (ref 0.50–1.10)
Chloride: 104 mEq/L (ref 96–112)
GFR, EST NON AFRICAN AMERICAN: 81 mL/min — AB (ref >90)
Glucose, Bld: 87 mg/dL (ref 70–99)
Potassium: 4.4 mEq/L (ref 3.7–5.3)
Sodium: 140 mEq/L (ref 137–147)

## 2013-03-26 LAB — URINE MICROSCOPIC-ADD ON

## 2013-03-26 MED ORDER — CIPROFLOXACIN HCL 500 MG PO TABS: 500.0000 mg | ORAL_TABLET | Freq: Two times a day (BID) | ORAL | Status: DC

## 2013-03-26 MED ORDER — ONDANSETRON 8 MG PO TBDP
8.0000 mg | ORAL_TABLET | Freq: Once | ORAL | Status: AC
  Administered 2013-03-26: 8 mg via ORAL
  Filled 2013-03-26: qty 1

## 2013-03-26 MED ORDER — KETOROLAC TROMETHAMINE 30 MG/ML IJ SOLN
30.0000 mg | Freq: Once | INTRAMUSCULAR | Status: AC
  Administered 2013-03-26: 30 mg via INTRAVENOUS
  Filled 2013-03-26: qty 1

## 2013-03-26 MED ORDER — IBUPROFEN 600 MG PO TABS: 600.0000 mg | ORAL_TABLET | Freq: Four times a day (QID) | ORAL | Status: DC | PRN

## 2013-03-26 MED ORDER — AZITHROMYCIN 250 MG PO TABS: 250.0000 mg | ORAL_TABLET | Freq: Every day | ORAL | Status: DC

## 2013-03-26 MED ORDER — PHENAZOPYRIDINE HCL 200 MG PO TABS: 200.0000 mg | ORAL_TABLET | Freq: Three times a day (TID) | ORAL | Status: DC

## 2013-03-26 NOTE — ED Notes (Signed)
Patient given discharge instruction, verbalized understand. IV removed, band aid applied. Patient ambulatory out of the department.  

## 2013-03-26 NOTE — ED Provider Notes (Signed)
Medical screening examination/treatment/procedure(s) were performed by non-physician practitioner and as supervising physician I was immediately available for consultation/collaboration.   EKG Interpretation None       Shon Baton, MD 03/26/13 [phone]

## 2013-03-26 NOTE — ED Provider Notes (Signed)
CSN: [id number]     Arrival date & time 03/26/13  1053 History   First MD Initiated Contact with Patient 03/26/13 1106     Chief Complaint  Patient presents with  . Flank Pain     (Consider location/radiation/quality/duration/timing/severity/associated sxs/prior Treatment) HPI Comments: Patient is a 38 y.o. F PMHx significant for ADD, PTSD presenting to the ED for 2-3 days of severe colicky right flank pain with radiation to right lower abdomen with associated urinary frequency, dysuria, urgency. Patient states she tried AZO pills without relief. Patient states her pain is aggravated with urinating. She endorses nausea and subjective fever and chills. Denies emesis, hematuria, constipation, diarrhea, vaginal bleeding or discharge. No menstrual cycles, s/p hysterectomy. Abdominal surgical history includes hysterectomy.   Patient is a 38 y.o. female presenting with flank pain.  Flank Pain Associated symptoms include abdominal pain, chills (subjective), a fever (subjective) and nausea. Pertinent negatives include no vomiting.    Past Medical History  Diagnosis Date  . ADD (attention deficit disorder)   . PTSD (post-traumatic stress disorder)    Past Surgical History  Procedure Laterality Date  . Abdominal hysterectomy     History reviewed. No pertinent family history. History  Substance Use Topics  . Smoking status: Current Every Day Smoker -- 1.00 packs/day for 10 years    Types: Cigarettes  . Smokeless tobacco: Never Used  . Alcohol Use: Yes     Comment: occasioanlly   OB History   Grav Para Term Preterm Abortions TAB SAB Ect Mult Living   8 1 1  7  7   1      Review of Systems  Constitutional: Positive for fever (subjective) and chills (subjective).  Gastrointestinal: Positive for nausea and abdominal pain. Negative for vomiting, diarrhea and constipation.  Genitourinary: Positive for dysuria, urgency, frequency and flank pain. Negative for hematuria, vaginal bleeding and  vaginal discharge.  All other systems reviewed and are negative.      Allergies  Morphine and related  Home Medications   Current Outpatient Rx  Name  Route  Sig  Dispense  Refill  . ALPRAZolam (XANAX) 1 MG tablet   Oral   Take 1 mg by mouth 3 (three) times daily.         Marland Kitchen amphetamine-dextroamphetamine (ADDERALL) 20 MG tablet   Oral   Take 20 mg by mouth daily.         . Phenazopyridine HCl (AZO TABS PO)   Oral   Take 2 tablets by mouth 2 (two) times daily.          BP 103/64  Pulse 77  Temp(Src) 97.4 F (36.3 C) (Oral)  Resp 16  Ht 5\' 4"  (1.626 m)  Wt 104 lb (47.174 kg)  BMI 17.84 kg/m2  SpO2 97%  LMP 03/09/2011 Physical Exam  Constitutional: She is oriented to person, place, and time. She appears well-developed and well-nourished. No distress.  HENT:  Head: Normocephalic and atraumatic.  Right Ear: External ear normal.  Left Ear: External ear normal.  Nose: Nose normal.  Mouth/Throat: Oropharynx is clear and moist.  Eyes: Conjunctivae are normal.  Neck: Normal range of motion. Neck supple.  Cardiovascular: Normal rate, regular rhythm and normal heart sounds.   Pulmonary/Chest: Effort normal and breath sounds normal. No accessory muscle usage. No respiratory distress.  Abdominal: Soft. Bowel sounds are normal. She exhibits no distension. There is tenderness. There is no rigidity, no rebound, no guarding and no CVA tenderness.    Musculoskeletal: Normal range of  motion.       Arms: Neurological: She is alert and oriented to person, place, and time. GCS eye subscore is 4. GCS verbal subscore is 5. GCS motor subscore is 6.  Skin: Skin is warm and dry. She is not diaphoretic.  Psychiatric: She has a normal mood and affect.    ED Course  Procedures (including critical care time) Medications  ketorolac (TORADOL) 30 MG/ML injection 30 mg (30 mg Intravenous Given 03/26/13 1136)  ondansetron (ZOFRAN-ODT) disintegrating tablet 8 mg (8 mg Oral Given 03/26/13  1136)    Labs Review Labs Reviewed  URINALYSIS, ROUTINE W REFLEX MICROSCOPIC - Abnormal; Notable for the following:    Color, Urine ORANGE (*)    Glucose, UA 100 (*)    Nitrite POSITIVE (*)    All other components within normal limits  CBC - Abnormal; Notable for the following:    WBC 12.6 (*)    All other components within normal limits  BASIC METABOLIC PANEL - Abnormal; Notable for the following:    GFR calc non Af Amer 81 (*)    All other components within normal limits  URINE MICROSCOPIC-ADD ON   Imaging Review Ct Abdomen Pelvis Wo Contrast  03/26/2013   CLINICAL DATA:  Right flank pain.  EXAM: CT ABDOMEN AND PELVIS WITHOUT CONTRAST  TECHNIQUE: Multidetector CT imaging of the abdomen and pelvis was performed following the standard protocol without intravenous contrast.  COMPARISON:  CT 09/10/2011  FINDINGS: Patchy small parenchymal densities in the right middle lobe. This has a tree-in-bud appearance. Findings are consistent with inflammation. Negative for free air.  Normal appearance of the liver, gallbladder, spleen and pancreas. Normal appearance of the adrenal glands and stomach. Normal appearance of the left kidney without stones or hydronephrosis. Decreased attenuation of the right kidney without stones or hydronephrosis. There is mild right perinephric edema.  Unenhanced CT was performed per clinician order. Lack of IV contrast limits sensitivity and specificity, especially for evaluation of abdominal/pelvic solid viscera.  Uterus has been removed. There are surgical clips or metallic structures in the region of the adnexal tissue. Moderate stool burden throughout the colon. Normal appearance of the appendix. No significant free fluid or lymphadenopathy. Again noted are small left periaortic lymph nodes. Chronic degenerate changes along the left SI joint related to a transitional vertebral body. No acute bone abnormality. Fluid in the urinary bladder. No evidence for urinary bladder  stones.  IMPRESSION: Diffuse low density of the right kidney without stones or hydronephrosis. Findings suggest edema and right pyelonephritis.  Patchy parenchymal densities in the right middle lobe with a tree-in-bud configuration. Findings are concerning for inflammation and pneumonia.   Electronically Signed   By: Richarda Overlie M.D.   On: 03/26/2013 12:21     EKG Interpretation None      MDM   Final diagnoses:  Pyelonephritis  CAP (community acquired pneumonia)    Filed Vitals:   03/26/13 1230  BP: 103/64  Pulse: 77  Temp:   Resp:    Afebrile, NAD, non-toxic appearing, AAOx4. Pain improved while in ED.  1) CAP: Patient has been diagnosed with CAP via CT abdomen/pelvis. Pt is not ill appearing, immunocompromised, and does not have multiple co morbidities, therefore I feel like the they can be treated as an OP with abx therapy.  2) Pyelonephritis: Pt has been diagnosed with pyelonephrosis. Pt is afebrile, no CVA tenderness, normotensive, and denies emesis. CT abd/pelv negative for nephrolithiasis does note pyelonephritis. Will treat as outpatient. No indications  for admission at this time.    Pt has been advised to return to the ED if symptoms worsen or they do not improve. Pt verbalizes understanding and is agreeable with plan.    Jeannetta Ellis, PA-C 03/26/13 1606

## 2013-03-26 NOTE — ED Notes (Signed)
Patient c/o right flank pain that radiates into right lower abd. Patient reports frequency and pressure/pain with urination. Denies noting any blood in urine. Per patient taking AZO pills x2 days with no relief.

## 2013-03-26 NOTE — Discharge Instructions (Signed)
Please follow up with your primary care physician in 1-2 days. If you do not have one please call one from list below. Please take your antibiotic until completion. Please take Motrin and Pyridium as prescribed to help with pain. Please read all discharge instructions and return precautions.   Pyelonephritis, Adult Pyelonephritis is a kidney infection. In general, there are 2 main types of pyelonephritis:  Infections that come on quickly without any warning (acute pyelonephritis).  Infections that persist for a long period of time (chronic pyelonephritis). CAUSES  Two main causes of pyelonephritis are:  Bacteria traveling from the bladder to the kidney. This is a problem especially in pregnant women. The urine in the bladder can become filled with bacteria from multiple causes, including:  Inflammation of the prostate gland (prostatitis).  Sexual intercourse in females.  Bladder infection (cystitis).  Bacteria traveling from the bloodstream to the tissue part of the kidney. Problems that may increase your risk of getting a kidney infection include:  Diabetes.  Kidney stones or bladder stones.  Cancer.  Catheters placed in the bladder.  Other abnormalities of the kidney or ureter. SYMPTOMS   Abdominal pain.  Pain in the side or flank area.  Fever.  Chills.  Upset stomach.  Blood in the urine (dark urine).  Frequent urination.  Strong or persistent urge to urinate.  Burning or stinging when urinating. DIAGNOSIS  Your caregiver may diagnose your kidney infection based on your symptoms. A urine sample may also be taken. TREATMENT  In general, treatment depends on how severe the infection is.   If the infection is mild and caught early, your caregiver may treat you with oral antibiotics and send you home.  If the infection is more severe, the bacteria may have gotten into the bloodstream. This will require intravenous (IV) antibiotics and a hospital stay. Symptoms  may include:  High fever.  Severe flank pain.  Shaking chills.  Even after a hospital stay, your caregiver may require you to be on oral antibiotics for a period of time.  Other treatments may be required depending upon the cause of the infection. HOME CARE INSTRUCTIONS   Take your antibiotics as directed. Finish them even if you start to feel better.  Make an appointment to have your urine checked to make sure the infection is gone.  Drink enough fluids to keep your urine clear or pale yellow.  Take medicines for the bladder if you have urgency and frequency of urination as directed by your caregiver. SEEK IMMEDIATE MEDICAL CARE IF:   You have a fever or persistent symptoms for more than 2-3 days.  You have a fever and your symptoms suddenly get worse.  You are unable to take your antibiotics or fluids.  You develop shaking chills.  You experience extreme weakness or fainting.  There is no improvement after 2 days of treatment. MAKE SURE YOU:  Understand these instructions.  Will watch your condition.  Will get help right away if you are not doing well or get worse. Document Released: 01/05/2005 Document Revised: 07/07/2011 Document Reviewed: 06/11/2010 [facility] Patient Information 2014 [place], [place]. Pneumonia, Adult Pneumonia is an infection of the lungs.  CAUSES Pneumonia may be caused by bacteria or a virus. Usually, these infections are caused by breathing infectious particles into the lungs (respiratory tract). SYMPTOMS   Cough.  Fever.  Chest pain.  Increased rate of breathing.  Wheezing.  Mucus production. DIAGNOSIS  If you have the common symptoms of pneumonia, your caregiver will typically  confirm the diagnosis with a chest X-ray. The X-ray will show an abnormality in the lung (pulmonary infiltrate) if you have pneumonia. Other tests of your blood, urine, or sputum may be done to find the specific cause of your pneumonia. Your caregiver may  also do tests (blood gases or pulse oximetry) to see how well your lungs are working. TREATMENT  Some forms of pneumonia may be spread to other people when you cough or sneeze. You may be asked to wear a mask before and during your exam. Pneumonia that is caused by bacteria is treated with antibiotic medicine. Pneumonia that is caused by the influenza virus may be treated with an antiviral medicine. Most other viral infections must run their course. These infections will not respond to antibiotics.  PREVENTION A pneumococcal shot (vaccine) is available to prevent a common bacterial cause of pneumonia. This is usually suggested for:  People over 71 years old.  Patients on chemotherapy.  People with chronic lung problems, such as bronchitis or emphysema.  People with immune system problems. If you are over 65 or have a high risk condition, you may receive the pneumococcal vaccine if you have not received it before. In some countries, a routine influenza vaccine is also recommended. This vaccine can help prevent some cases of pneumonia.You may be offered the influenza vaccine as part of your care. If you smoke, it is time to quit. You may receive instructions on how to stop smoking. Your caregiver can provide medicines and counseling to help you quit. HOME CARE INSTRUCTIONS   Cough suppressants may be used if you are losing too much rest. However, coughing protects you by clearing your lungs. You should avoid using cough suppressants if you can.  Your caregiver may have prescribed medicine if he or she thinks your pneumonia is caused by a bacteria or influenza. Finish your medicine even if you start to feel better.  Your caregiver may also prescribe an expectorant. This loosens the mucus to be coughed up.  Only take over-the-counter or prescription medicines for pain, discomfort, or fever as directed by your caregiver.  Do not smoke. Smoking is a common cause of bronchitis and can contribute  to pneumonia. If you are a smoker and continue to smoke, your cough may last several weeks after your pneumonia has cleared.  A cold steam vaporizer or humidifier in your room or home may help loosen mucus.  Coughing is often worse at night. Sleeping in a semi-upright position in a recliner or using a couple pillows under your head will help with this.  Get rest as you feel it is needed. Your body will usually let you know when you need to rest. SEEK IMMEDIATE MEDICAL CARE IF:   Your illness becomes worse. This is especially true if you are elderly or weakened from any other disease.  You cannot control your cough with suppressants and are losing sleep.  You begin coughing up blood.  You develop pain which is getting worse or is uncontrolled with medicines.  You have a fever.  Any of the symptoms which initially brought you in for treatment are getting worse rather than better.  You develop shortness of breath or chest pain. MAKE SURE YOU:   Understand these instructions.  Will watch your condition.  Will get help right away if you are not doing well or get worse. Document Released: 01/05/2005 Document Revised: 03/30/2011 Document Reviewed: 03/27/2010 [facility] Patient Information 2014 [place], [place].

## 2013-03-26 NOTE — ED Notes (Signed)
Pt seen here earlier in the day, diagnosed with pneumonia, states she took the first doses of her antibiotics but feels worse than she did earlier.

## 2013-03-27 ENCOUNTER — Emergency Department (HOSPITAL_COMMUNITY)
Admission: EM | Admit: 2013-03-27 | Discharge: 2013-03-27 | Disposition: A | Discharge status: Home / Self Care | Payer: Medicaid Other | Source: Home / Self Care | Attending: Emergency Medicine | Admitting: Emergency Medicine

## 2013-03-27 DIAGNOSIS — N12 Tubulo-interstitial nephritis, not specified as acute or chronic: Secondary | ICD-10-CM

## 2013-03-27 DIAGNOSIS — J189 Pneumonia, unspecified organism: Secondary | ICD-10-CM

## 2013-03-27 HISTORY — DX: Pneumonia, unspecified organism: J18.9

## 2013-03-27 MED ORDER — HYDROCODONE-ACETAMINOPHEN 5-325 MG PO TABS: 1.0000 | ORAL_TABLET | ORAL | Status: DC | PRN

## 2013-03-27 MED ORDER — PROMETHAZINE HCL 25 MG PO TABS
25.0000 mg | ORAL_TABLET | Freq: Four times a day (QID) | ORAL | Status: DC | PRN
Start: 2013-03-27 — End: 2013-06-04

## 2013-03-27 MED ORDER — HYDROCODONE-ACETAMINOPHEN 5-325 MG PO TABS
2.0000 | ORAL_TABLET | Freq: Once | ORAL | Status: AC
  Administered 2013-03-27: 2 via ORAL
  Filled 2013-03-27: qty 1

## 2013-03-27 MED ORDER — PROMETHAZINE HCL 12.5 MG PO TABS
12.5000 mg | ORAL_TABLET | Freq: Once | ORAL | Status: AC
  Administered 2013-03-27: 12.5 mg via ORAL
  Filled 2013-03-27: qty 1

## 2013-03-27 MED ORDER — IBUPROFEN 800 MG PO TABS: 800.0000 mg | ORAL_TABLET | Freq: Three times a day (TID) | ORAL | Status: DC

## 2013-03-27 MED ORDER — LIDOCAINE HCL (PF) 1 % IJ SOLN
INTRAMUSCULAR | Status: AC
  Filled 2013-03-27: qty 5

## 2013-03-27 MED ORDER — IBUPROFEN 800 MG PO TABS
800.0000 mg | ORAL_TABLET | Freq: Once | ORAL | Status: AC
  Administered 2013-03-27: 800 mg via ORAL
  Filled 2013-03-27: qty 1

## 2013-03-27 MED ORDER — CEFTRIAXONE SODIUM 1 G IJ SOLR
1.0000 g | Freq: Once | INTRAMUSCULAR | Status: AC
  Administered 2013-03-27: 1 g via INTRAMUSCULAR
  Filled 2013-03-27: qty 10

## 2013-03-27 NOTE — ED Notes (Signed)
Patient with continued pain 5/10 at this time. Respirations even and unlabored. Skin warm/dry. Discharge instructions reviewed with patient at this time. Patient given opportunity to voice concerns/ask questions. Patient discharged at this time and left Emergency Department with steady gait.

## 2013-03-27 NOTE — ED Provider Notes (Signed)
CSN: [id number]     Arrival date & time 03/26/13  2338 History   First MD Initiated Contact with Patient 03/27/13 0051     No chief complaint on file.    (Consider location/radiation/quality/duration/timing/severity/associated sxs/prior Treatment) HPI Comments: The patient is a 38 year old female who presents to the emergency department with complaint of back pain, chest soreness, nausea, and weakness. The patient was seen in the emergency department earlier today at which time she had similar symptoms along with some dysuria and urgency. The patient was diagnosed by CT scan with pneumonia in the right lung and pyelonephritis. The patient's vital signs were all stable. The pulse oximetry was well within normal limits. The patient was not toxic. The patient's labs did not indicate a level of excess that would require admission. Patient was treated with Cipro and Zithromax and ibuprofen. The patient returns tonight with the above symptoms, states she feels worse. She knowledge is that she's only taken one dose of the medication. The patient has not had vomiting or diarrhea. She is able to keep her medications down. She states however that the back pain and chest discomfort as well as the weakness and nausea are very aggravating and also unbearable. She requests reevaluation and assistance with these symptoms.  The history is provided by the patient.    Past Medical History  Diagnosis Date  . ADD (attention deficit disorder)   . PTSD (post-traumatic stress disorder)   . Pneumonia    Past Surgical History  Procedure Laterality Date  . Abdominal hysterectomy     No family history on file. History  Substance Use Topics  . Smoking status: Current Every Day Smoker -- 1.00 packs/day for 10 years    Types: Cigarettes  . Smokeless tobacco: Never Used  . Alcohol Use: Yes     Comment: occasioanlly   OB History   Grav Para Term Preterm Abortions TAB SAB Ect Mult Living   8 1 1  7  7   1       Review of Systems  Constitutional: Positive for appetite change and fatigue. Negative for activity change.       All ROS Neg except as noted in HPI  HENT: Negative for nosebleeds.   Eyes: Negative for photophobia and discharge.  Respiratory: Negative for cough, shortness of breath and wheezing.   Cardiovascular: Negative for chest pain and palpitations.  Gastrointestinal: Positive for nausea. Negative for abdominal pain and blood in stool.  Genitourinary: Negative for dysuria, frequency and hematuria.  Musculoskeletal: Positive for back pain and myalgias. Negative for arthralgias and neck pain.  Skin: Negative.   Neurological: Negative for dizziness, seizures and speech difficulty.  Psychiatric/Behavioral: Negative for hallucinations and confusion.      Allergies  Morphine and related  Home Medications   Current Outpatient Rx  Name  Route  Sig  Dispense  Refill  . ALPRAZolam (XANAX) 1 MG tablet   Oral   Take 1 mg by mouth 3 (three) times daily.         amphetamine-dextroamphetamine (ADDERALL) 20 MG tablet   Oral   Take 20 mg by mouth daily.         Marland Kitchen azithromycin (ZITHROMAX Z-PAK) 250 MG tablet   Oral   Take 1 tablet (250 mg total) by mouth daily. 500mg  PO day 1, then 250mg  PO days 205   6 tablet   0   . ciprofloxacin (CIPRO) 500 MG tablet   Oral   Take 1 tablet (500 mg  total) by mouth every 12 (twelve) hours.   20 tablet   0   . ibuprofen (ADVIL,MOTRIN) 600 MG tablet   Oral   Take 1 tablet (600 mg total) by mouth every 6 (six) hours as needed.   30 tablet   0   . phenazopyridine (PYRIDIUM) 200 MG tablet   Oral   Take 1 tablet (200 mg total) by mouth 3 (three) times daily.   6 tablet   0   . Phenazopyridine HCl (AZO TABS PO)   Oral   Take 2 tablets by mouth 2 (two) times daily.          BP 109/85  Pulse 72  Temp(Src) 97.6 F (36.4 C) (Oral)  Resp 16  Ht 5\' 4"  (1.626 m)  Wt 104 lb (47.174 kg)  BMI 17.84 kg/m2  SpO2 98%  LMP  03/09/2011 Physical Exam  Nursing note and vitals reviewed. Constitutional: She is oriented to person, place, and time. She appears well-developed and well-nourished.  Non-toxic appearance.  HENT:  Head: Normocephalic.  Right Ear: Tympanic membrane and external ear normal.  Left Ear: Tympanic membrane and external ear normal.  Eyes: EOM and lids are normal. Pupils are equal, round, and reactive to light.  Neck: Normal range of motion. Neck supple. Carotid bruit is not present.  Cardiovascular: Normal rate, regular rhythm, normal heart sounds, intact distal pulses and normal pulses.   Pulmonary/Chest: Breath sounds normal. No respiratory distress.  Anterior chest wall tenderness to palpation. Symmetrical rise and fall of the chest. Patient speaks in complete sentences.  Abdominal: Soft. Bowel sounds are normal. There is no guarding.  Abdominal tenderness diffusely. There is no distention.  Musculoskeletal: Normal range of motion.  Some muscle aches and soreness to palpation. No hot joints appreciated. Some stiffness of the joints appreciated.  Lymphadenopathy:       Head (right side): No submandibular adenopathy present.       Head (left side): No submandibular adenopathy present.    She has no cervical adenopathy.  Neurological: She is alert and oriented to person, place, and time. She has normal strength. No cranial nerve deficit or sensory deficit. She exhibits normal muscle tone. Coordination normal.  Skin: Skin is warm and dry.  Psychiatric: She has a normal mood and affect. Her speech is normal.    ED Course  Procedures (including critical care time) Labs Review Labs Reviewed - No data to display Imaging Review Ct Abdomen Pelvis Wo Contrast  03/26/2013   CLINICAL DATA:  Right flank pain.  EXAM: CT ABDOMEN AND PELVIS WITHOUT CONTRAST  TECHNIQUE: Multidetector CT imaging of the abdomen and pelvis was performed following the standard protocol without intravenous contrast.   COMPARISON:  CT 09/10/2011  FINDINGS: Patchy small parenchymal densities in the right middle lobe. This has a tree-in-bud appearance. Findings are consistent with inflammation. Negative for free air.  Normal appearance of the liver, gallbladder, spleen and pancreas. Normal appearance of the adrenal glands and stomach. Normal appearance of the left kidney without stones or hydronephrosis. Decreased attenuation of the right kidney without stones or hydronephrosis. There is mild right perinephric edema.  Unenhanced CT was performed per clinician order. Lack of IV contrast limits sensitivity and specificity, especially for evaluation of abdominal/pelvic solid viscera.  Uterus has been removed. There are surgical clips or metallic structures in the region of the adnexal tissue. Moderate stool burden throughout the colon. Normal appearance of the appendix. No significant free fluid or lymphadenopathy. Again noted are small left  periaortic lymph nodes. Chronic degenerate changes along the left SI joint related to a transitional vertebral body. No acute bone abnormality. Fluid in the urinary bladder. No evidence for urinary bladder stones.  IMPRESSION: Diffuse low density of the right kidney without stones or hydronephrosis. Findings suggest edema and right pyelonephritis.  Patchy parenchymal densities in the right middle lobe with a tree-in-bud configuration. Findings are concerning for inflammation and pneumonia.   Electronically Signed   By: Richarda Overlie M.D.   On: 03/26/2013 12:21     EKG Interpretation None      MDM Patient presented to the emergency department earlier today with multiple symptoms including urinary frequency, subjective fever, chills, nausea, and dysuria. She was treated with Cipro and Zithromax. The patient took one dose of medication today. She states that she feels worse. She presents tonight with back pain, chest hurting, nausea, sweats, and feeling weak.  I have reviewed the chart from  this morning. I reviewed the lab work. Today no acute exam changes noted. Vital signs are well within normal limits tonight.  The plan at this time is to reassure the patient that her labs from this morning, her vital signs from earlier this morning as well as tonight, and her examination have been reviewed. No change in the diagnoses of right middle lobe pneumonia and pyelonephritis. Patient will be given an injection of Rocephin, ibuprofen, Norco, and promethazine here in the department. Patient will be given promethazine and Norco prescriptions, and asked to continue her ibuprofen, her Zithromax, and her Cipro. I have given the patient strict instructions to return if she is having high fevers, unable to keep medications down, having difficulty with breathing, passing blood in the urine, or deterioration in her general condition.    Final diagnoses:  None    *I have reviewed nursing notes, vital signs, and all appropriate lab and imaging results for this patient., **    Kathie Dike, PA-C 03/27/13 0129

## 2013-03-27 NOTE — Discharge Instructions (Signed)
Vital signs remained stable. I have reviewed your CT scan in your labs from earlier today. You were given an injection of Rocephin in the emergency department tonight. Please add ibuprofen for inflammation and fever, Norco for pain, and promethazine for nausea to your Cipro and Zithromax. Please increase fluids. Please rest. Please see your [place], access physician, or return to the emergency department if you passing blood in your urine, having high fevers of 101 or greater, having severe vomiting and nausea and unable to keep medications down, her general deterioration in your condition. Pneumonia, Adult Pneumonia is an infection of the lungs. It may be caused by a germ (virus or bacteria). Some types of pneumonia can spread easily from person to person. This can happen when you cough or sneeze. HOME CARE  Only take medicine as told by your doctor.  Take your medicine (antibiotics) as told. Finish it even if you start to feel better.  Do not smoke.  You may use a vaporizer or humidifier in your room. This can help loosen thick spit (mucus).  Sleep so you are almost sitting up (semi-upright). This helps reduce coughing.  Rest. A shot (vaccine) can help prevent pneumonia. Shots are often advised for:  People over 42 years old.  Patients on chemotherapy.  People with long-term (chronic) lung problems.  People with immune system problems. GET HELP RIGHT AWAY IF:   You are getting worse.  You cannot control your cough, and you are losing sleep.  You cough up blood.  Your pain gets worse, even with medicine.  You have a fever.  Any of your problems are getting worse, not better.  You have shortness of breath or chest pain. MAKE SURE YOU:   Understand these instructions.  Will watch your condition.  Will get help right away if you are not doing well or get worse. Document Released: 06/24/2007 Document Revised: 03/30/2011 Document Reviewed: 03/28/2010 [facility] Patient  Information 2014 [place], [place].  Pyelonephritis, Adult Pyelonephritis is a kidney infection. In general, there are 2 main types of pyelonephritis:  Infections that come on quickly without any warning (acute pyelonephritis).  Infections that persist for a long period of time (chronic pyelonephritis). CAUSES  Two main causes of pyelonephritis are:  Bacteria traveling from the bladder to the kidney. This is a problem especially in pregnant women. The urine in the bladder can become filled with bacteria from multiple causes, including:  Inflammation of the prostate gland (prostatitis).  Sexual intercourse in females.  Bladder infection (cystitis).  Bacteria traveling from the bloodstream to the tissue part of the kidney. Problems that may increase your risk of getting a kidney infection include:  Diabetes.  Kidney stones or bladder stones.  Cancer.  Catheters placed in the bladder.  Other abnormalities of the kidney or ureter. SYMPTOMS   Abdominal pain.  Pain in the side or flank area.  Fever.  Chills.  Upset stomach.  Blood in the urine (dark urine).  Frequent urination.  Strong or persistent urge to urinate.  Burning or stinging when urinating. DIAGNOSIS  Your caregiver may diagnose your kidney infection based on your symptoms. A urine sample may also be taken. TREATMENT  In general, treatment depends on how severe the infection is.   If the infection is mild and caught early, your caregiver may treat you with oral antibiotics and send you home.  If the infection is more severe, the bacteria may have gotten into the bloodstream. This will require intravenous (IV) antibiotics and a hospital  stay. Symptoms may include:  High fever.  Severe flank pain.  Shaking chills.  Even after a hospital stay, your caregiver may require you to be on oral antibiotics for a period of time.  Other treatments may be required depending upon the cause of the  infection. HOME CARE INSTRUCTIONS   Take your antibiotics as directed. Finish them even if you start to feel better.  Make an appointment to have your urine checked to make sure the infection is gone.  Drink enough fluids to keep your urine clear or pale yellow.  Take medicines for the bladder if you have urgency and frequency of urination as directed by your caregiver. SEEK IMMEDIATE MEDICAL CARE IF:   You have a fever or persistent symptoms for more than 2-3 days.  You have a fever and your symptoms suddenly get worse.  You are unable to take your antibiotics or fluids.  You develop shaking chills.  You experience extreme weakness or fainting.  There is no improvement after 2 days of treatment. MAKE SURE YOU:  Understand these instructions.  Will watch your condition.  Will get help right away if you are not doing well or get worse. Document Released: 01/05/2005 Document Revised: 07/07/2011 Document Reviewed: 06/11/2010 [facility] Patient Information 2014 [place], [place].

## 2013-03-27 NOTE — ED Provider Notes (Signed)
Medical screening examination/treatment/procedure(s) were performed by non-physician practitioner and as supervising physician I was immediately available for consultation/collaboration.   EKG Interpretation None        Hanley Seamen, MD 03/27/13 [phone]

## 2013-06-04 ENCOUNTER — Encounter (HOSPITAL_COMMUNITY): Payer: Self-pay | Admitting: Emergency Medicine

## 2013-06-04 ENCOUNTER — Emergency Department (HOSPITAL_COMMUNITY)
Admission: EM | Admit: 2013-06-04 | Discharge: 2013-06-04 | Disposition: A | Discharge status: Home / Self Care | Payer: Medicaid Other | Attending: Emergency Medicine | Admitting: Emergency Medicine

## 2013-06-04 DIAGNOSIS — L739 Follicular disorder, unspecified: Secondary | ICD-10-CM

## 2013-06-04 DIAGNOSIS — R52 Pain, unspecified: Secondary | ICD-10-CM | POA: Insufficient documentation

## 2013-06-04 DIAGNOSIS — Z791 Long term (current) use of non-steroidal anti-inflammatories (NSAID): Secondary | ICD-10-CM | POA: Insufficient documentation

## 2013-06-04 DIAGNOSIS — Z8659 Personal history of other mental and behavioral disorders: Secondary | ICD-10-CM | POA: Insufficient documentation

## 2013-06-04 DIAGNOSIS — Z3202 Encounter for pregnancy test, result negative: Secondary | ICD-10-CM | POA: Insufficient documentation

## 2013-06-04 DIAGNOSIS — L738 Other specified follicular disorders: Secondary | ICD-10-CM | POA: Insufficient documentation

## 2013-06-04 DIAGNOSIS — F17200 Nicotine dependence, unspecified, uncomplicated: Secondary | ICD-10-CM | POA: Insufficient documentation

## 2013-06-04 DIAGNOSIS — Z79899 Other long term (current) drug therapy: Secondary | ICD-10-CM | POA: Insufficient documentation

## 2013-06-04 DIAGNOSIS — L678 Other hair color and hair shaft abnormalities: Secondary | ICD-10-CM | POA: Insufficient documentation

## 2013-06-04 DIAGNOSIS — Z792 Long term (current) use of antibiotics: Secondary | ICD-10-CM | POA: Insufficient documentation

## 2013-06-04 DIAGNOSIS — Z8701 Personal history of pneumonia (recurrent): Secondary | ICD-10-CM | POA: Insufficient documentation

## 2013-06-04 LAB — URINALYSIS, ROUTINE W REFLEX MICROSCOPIC
BILIRUBIN URINE: NEGATIVE
GLUCOSE, UA: NEGATIVE mg/dL
HGB URINE DIPSTICK: NEGATIVE
KETONES UR: NEGATIVE mg/dL
Leukocytes, UA: NEGATIVE
Nitrite: NEGATIVE
PROTEIN: NEGATIVE mg/dL
Specific Gravity, Urine: <1.005 — ABNORMAL LOW (ref 1.005–1.030)
Urobilinogen, UA: 0.2 mg/dL (ref 0.0–1.0)
pH: 6 (ref 5.0–8.0)

## 2013-06-04 LAB — COMPREHENSIVE METABOLIC PANEL
ALK PHOS: 68 U/L (ref 39–117)
ALT: 8 U/L (ref 0–35)
AST: 15 U/L (ref 0–37)
Albumin: 3.8 g/dL (ref 3.5–5.2)
BILIRUBIN TOTAL: 0.2 mg/dL — AB (ref 0.3–1.2)
BUN: 11 mg/dL (ref 6–23)
CHLORIDE: 105 meq/L (ref 96–112)
CO2: 21 mEq/L (ref 19–32)
Calcium: 9.4 mg/dL (ref 8.4–10.5)
Creatinine, Ser: 0.69 mg/dL (ref 0.50–1.10)
GFR calc Af Amer: >90 mL/min (ref >90)
GFR calc non Af Amer: >90 mL/min (ref >90)
Glucose, Bld: 111 mg/dL — ABNORMAL HIGH (ref 70–99)
POTASSIUM: 4.3 meq/L (ref 3.7–5.3)
Sodium: 138 mEq/L (ref 137–147)
Total Protein: 7.3 g/dL (ref 6.0–8.3)

## 2013-06-04 LAB — CBC WITH DIFFERENTIAL/PLATELET
BASOS ABS: 0 10*3/uL (ref 0.0–0.1)
Basophils Relative: 0 % (ref 0–1)
Eosinophils Absolute: 0.1 10*3/uL (ref 0.0–0.7)
Eosinophils Relative: 2 % (ref 0–5)
HCT: 40.2 % (ref 36.0–46.0)
HEMOGLOBIN: 13.8 g/dL (ref 12.0–15.0)
LYMPHS PCT: 18 % (ref 12–46)
Lymphs Abs: 1.3 10*3/uL (ref 0.7–4.0)
MCH: 31.4 pg (ref 26.0–34.0)
MCHC: 34.3 g/dL (ref 30.0–36.0)
MCV: 91.6 fL (ref 78.0–100.0)
MONOS PCT: 4 % (ref 3–12)
Monocytes Absolute: 0.3 10*3/uL (ref 0.1–1.0)
NEUTROS ABS: 5.5 10*3/uL (ref 1.7–7.7)
NEUTROS PCT: 76 % (ref 43–77)
Platelets: 262 10*3/uL (ref 150–400)
RBC: 4.39 MIL/uL (ref 3.87–5.11)
RDW: 12.4 % (ref 11.5–15.5)
WBC: 7.2 10*3/uL (ref 4.0–10.5)

## 2013-06-04 LAB — POC URINE PREG, ED: Preg Test, Ur: NEGATIVE

## 2013-06-04 MED ORDER — SULFAMETHOXAZOLE-TRIMETHOPRIM 800-160 MG PO TABS: 1.0000 | ORAL_TABLET | Freq: Two times a day (BID) | ORAL | Status: DC

## 2013-06-04 MED ORDER — SULFAMETHOXAZOLE-TRIMETHOPRIM 200-40 MG/5ML PO SUSP
20.0000 mL | Freq: Once | ORAL | Status: AC
  Administered 2013-06-04: 20 mL via ORAL

## 2013-06-04 MED ORDER — SULFAMETHOXAZOLE-TRIMETHOPRIM 200-40 MG/5ML PO SUSP
ORAL | Status: AC
  Filled 2013-06-04: qty 40

## 2013-06-04 NOTE — Discharge Instructions (Signed)
° ° °  Your rash is likely due to folliculitis,  and it is important to take your antibiotics as directed. Procedure follow up with your primary care physician.  Return here for concerning changes in your condition.        Folliculitis  Folliculitis is redness, soreness, and swelling (inflammation) of the hair follicles. This condition can occur anywhere on the body. People with weakened immune systems, diabetes, or obesity have a greater risk of getting folliculitis. CAUSES  Bacterial infection. This is the most common cause.  Fungal infection.  Viral infection.  Contact with certain chemicals, especially oils and tars. Long-term folliculitis can result from bacteria that live in the nostrils. The bacteria may trigger multiple outbreaks of folliculitis over time. SYMPTOMS Folliculitis most commonly occurs on the scalp, thighs, legs, back, buttocks, and areas where hair is shaved frequently. An early sign of folliculitis is a small, white or yellow, pus-filled, itchy lesion (pustule). These lesions appear on a red, inflamed follicle. They are usually less than 0.2 inches (5 mm) wide. When there is an infection of the follicle that goes deeper, it becomes a boil or furuncle. A group of closely packed boils creates a larger lesion (carbuncle). Carbuncles tend to occur in hairy, sweaty areas of the body. DIAGNOSIS  Your caregiver can usually tell what is wrong by doing a physical exam. A sample may be taken from one of the lesions and tested in a lab. This can help determine what is causing your folliculitis. TREATMENT  Treatment may include:  Applying warm compresses to the affected areas.  Taking antibiotic medicines orally or applying them to the skin.  Draining the lesions if they contain a large amount of pus or fluid.  Laser hair removal for cases of long-lasting folliculitis. This helps to prevent regrowth of the hair. HOME CARE INSTRUCTIONS  Apply warm compresses to the  affected areas as directed by your caregiver.  If antibiotics are prescribed, take them as directed. Finish them even if you start to feel better.  You may take over-the-counter medicines to relieve itching.  Do not shave irritated skin.  Follow up with your caregiver as directed. SEEK IMMEDIATE MEDICAL CARE IF:   You have increasing redness, swelling, or pain in the affected area.  You have a fever. MAKE SURE YOU:  Understand these instructions.  Will watch your condition.  Will get help right away if you are not doing well or get worse. Document Released: 03/16/2001 Document Revised: 07/07/2011 Document Reviewed: 04/07/2011 [facility] Patient Information 2014 [place], [place].

## 2013-06-04 NOTE — ED Notes (Signed)
Pt c/o rash that started a week ago Friday, headache, generalized body aches, n/v, sore throat, decreased appetite that started this past Thursday,

## 2013-06-04 NOTE — ED Provider Notes (Signed)
CSN: [id number]     Arrival date & time 06/04/13  1447 History  This chart was scribed for Gerhard Munch, MD by Danella Maiers, ED Scribe. This patient was seen in room APA07/APA07 and the patient's care was started at 3:31 PM.   Chief Complaint  Patient presents with  . Rash  . Generalized Body Aches   The history is provided by the patient. No language interpreter was used.   HPI Comments: Linda Calderon is a 38 y.o. female who presents to the Emergency Department complaining of an itchy, stinging rash onset 9 days ago followed by associated nausea, vomiting, and fever. She states it started in the chest and has spread to the entire body. She has been having fever and chills for the last 4 days. Before 9 days ago she was doing fine. She also reports chest discomfort and headache. She has been taking benadryl, tylenol, and ibuprofen. Shots are UTD.    Past Medical History  Diagnosis Date  . ADD (attention deficit disorder)   . PTSD (post-traumatic stress disorder)   . Pneumonia    Past Surgical History  Procedure Laterality Date  . Abdominal hysterectomy     No family history on file. History  Substance Use Topics  . Smoking status: Current Every Day Smoker -- 1.00 packs/day for 10 years    Types: Cigarettes  . Smokeless tobacco: Never Used  . Alcohol Use: Yes     Comment: occasioanlly   OB History   Grav Para Term Preterm Abortions TAB SAB Ect Mult Living   8 1 1  7  7   1      Review of Systems  Constitutional:       Per HPI, otherwise negative  HENT:       Per HPI, otherwise negative  Respiratory:       Per HPI, otherwise negative  Cardiovascular:       Per HPI, otherwise negative  Gastrointestinal: Negative for vomiting.  Endocrine:       Negative aside from HPI  Genitourinary:       Neg aside from HPI   Musculoskeletal:       Per HPI, otherwise negative  Skin: Negative.   Neurological: Negative for syncope.      Allergies  Morphine and related  Home  Medications   Prior to Admission medications   Medication Sig Start Date End Date Taking? Authorizing Provider  ALPRAZolam ) 1 MG tablet Take 1 mg by mouth 3 (three) times daily.    Historical Provider, MD  amphetamine-dextroamphetamine (ADDERALL) 20 MG tablet Take 20 mg by mouth daily.    Historical Provider, MD  azithromycin (ZITHROMAX Z-PAK) 250 MG tablet Take 1 tablet (250 mg total) by mouth daily. 500mg  PO day 1, then 250mg  PO days 205 03/26/13   Jennifer L Piepenbrink, PA-C  ciprofloxacin (CIPRO) 500 MG tablet Take 1 tablet (500 mg total) by mouth every 12 (twelve) hours. 03/26/13   Jennifer L Piepenbrink, PA-C  HYDROcodone-acetaminophen (NORCO/VICODIN) 5-325 MG per tablet Take 1 tablet by mouth every 4 (four) hours as needed for moderate pain. 03/27/13   05/26/13, PA-C  ibuprofen (ADVIL,MOTRIN) 600 MG tablet Take 1 tablet (600 mg total) by mouth every 6 (six) hours as needed. 03/26/13   Jennifer L Piepenbrink, PA-C  ibuprofen (ADVIL,MOTRIN) 800 MG tablet Take 1 tablet (800 mg total) by mouth 3 (three) times daily. 03/27/13   Kathie Dike, PA-C  phenazopyridine (PYRIDIUM) 200 MG tablet Take 1  tablet (200 mg total) by mouth 3 (three) times daily. 03/26/13   Jennifer L Piepenbrink, PA-C  Phenazopyridine HCl (AZO TABS PO) Take 2 tablets by mouth 2 (two) times daily.    Historical Provider, MD  promethazine (PHENERGAN) 25 MG tablet Take 1 tablet (25 mg total) by mouth every 6 (six) hours as needed for nausea or vomiting. 03/27/13   Kathie Dike, PA-C   BP 109/92  Pulse 68  Temp(Src) 97.3 F (36.3 C) (Oral)  Resp 20  Ht 5\' 4"  (1.626 m)  Wt 107 lb (48.535 kg)  BMI 18.36 kg/m2  SpO2 99%  LMP 03/09/2011 Physical Exam  Nursing note and vitals reviewed. Constitutional: She is oriented to person, place, and time. She appears well-developed and well-nourished. No distress.  HENT:  Head: Normocephalic and atraumatic.  Mouth/Throat: Oropharynx is clear and moist.  Eyes: Conjunctivae and  EOM are normal.  Cardiovascular: Normal rate and regular rhythm.   Pulmonary/Chest: Effort normal and breath sounds normal. No stridor. No respiratory distress.  Abdominal: She exhibits no distension.  Musculoskeletal: She exhibits no edema.  Neurological: She is alert and oriented to person, place, and time. No cranial nerve deficit.  Skin: Skin is warm and dry.  Papular minimally raised rash with full areas of excoriation throughout. Scattered lesions on back.   Psychiatric: She has a normal mood and affect.    ED Course  Procedures (including critical care time) Medications - No data to display  DIAGNOSTIC STUDIES: Oxygen Saturation is 99% on RA, normal by my interpretation.    COORDINATION OF CARE: 3:34 PM- Discussed treatment plan with pt. Pt agrees to plan.    Labs Review Labs Reviewed  COMPREHENSIVE METABOLIC PANEL - Abnormal; Notable for the following:    Glucose, Bld 111 (*)    Total Bilirubin 0.2 (*)    All other components within normal limits  URINALYSIS, ROUTINE W REFLEX MICROSCOPIC - Abnormal; Notable for the following:    APPearance CLOUDY (*)    Specific Gravity, Urine <1.005 (*)    All other components within normal limits  CBC WITH DIFFERENTIAL  POC URINE PREG, ED     MDM    I personally performed the services described in this documentation, which was scribed in my presence. The recorded information has been reviewed and is accurate.   Patient presents with cutaneous lesions.  Patient is awake, alert, afebrile, hemodynamically stable, in no distress.  1 patient's presentation is most consistent with folliculitis given the distribution of lesions. No distress, stable vitals and labs she was discharged after initiation of antibiotic therapy to f/u w PMD.  03/11/2011, MD 06/04/13 1650

## 2013-10-18 ENCOUNTER — Emergency Department (HOSPITAL_COMMUNITY)
Admission: EM | Admit: 2013-10-18 | Discharge: 2013-10-18 | Disposition: A | Discharge status: Home / Self Care | Payer: Medicaid Other | Attending: Emergency Medicine | Admitting: Emergency Medicine

## 2013-10-18 ENCOUNTER — Encounter (HOSPITAL_COMMUNITY): Payer: Self-pay | Admitting: Emergency Medicine

## 2013-10-18 DIAGNOSIS — Z8701 Personal history of pneumonia (recurrent): Secondary | ICD-10-CM | POA: Diagnosis not present

## 2013-10-18 DIAGNOSIS — E860 Dehydration: Secondary | ICD-10-CM | POA: Diagnosis not present

## 2013-10-18 DIAGNOSIS — F17200 Nicotine dependence, unspecified, uncomplicated: Secondary | ICD-10-CM | POA: Insufficient documentation

## 2013-10-18 DIAGNOSIS — R1110 Vomiting, unspecified: Secondary | ICD-10-CM | POA: Diagnosis not present

## 2013-10-18 DIAGNOSIS — R197 Diarrhea, unspecified: Secondary | ICD-10-CM | POA: Insufficient documentation

## 2013-10-18 DIAGNOSIS — F909 Attention-deficit hyperactivity disorder, unspecified type: Secondary | ICD-10-CM | POA: Insufficient documentation

## 2013-10-18 DIAGNOSIS — Z79899 Other long term (current) drug therapy: Secondary | ICD-10-CM | POA: Diagnosis not present

## 2013-10-18 LAB — CBC WITH DIFFERENTIAL/PLATELET
BASOS PCT: 1 % (ref 0–1)
Basophils Absolute: 0 10*3/uL (ref 0.0–0.1)
EOS ABS: 0.1 10*3/uL (ref 0.0–0.7)
Eosinophils Relative: 2 % (ref 0–5)
HEMATOCRIT: 37.7 % (ref 36.0–46.0)
HEMOGLOBIN: 13.4 g/dL (ref 12.0–15.0)
Lymphocytes Relative: 29 % (ref 12–46)
Lymphs Abs: 1.8 10*3/uL (ref 0.7–4.0)
MCH: 32.1 pg (ref 26.0–34.0)
MCHC: 35.5 g/dL (ref 30.0–36.0)
MCV: 90.2 fL (ref 78.0–100.0)
MONO ABS: 0.3 10*3/uL (ref 0.1–1.0)
Monocytes Relative: 5 % (ref 3–12)
NEUTROS ABS: 3.9 10*3/uL (ref 1.7–7.7)
Neutrophils Relative %: 63 % (ref 43–77)
Platelets: 275 10*3/uL (ref 150–400)
RBC: 4.18 MIL/uL (ref 3.87–5.11)
RDW: 12.2 % (ref 11.5–15.5)
WBC: 6.1 10*3/uL (ref 4.0–10.5)

## 2013-10-18 LAB — BASIC METABOLIC PANEL
Anion gap: 11 (ref 5–15)
BUN: 9 mg/dL (ref 6–23)
CHLORIDE: 106 meq/L (ref 96–112)
CO2: 21 mEq/L (ref 19–32)
Calcium: 9.2 mg/dL (ref 8.4–10.5)
Creatinine, Ser: 0.58 mg/dL (ref 0.50–1.10)
GFR calc non Af Amer: >90 mL/min (ref >90)
Glucose, Bld: 79 mg/dL (ref 70–99)
Potassium: 4 mEq/L (ref 3.7–5.3)
Sodium: 138 mEq/L (ref 137–147)

## 2013-10-18 MED ORDER — SODIUM CHLORIDE 0.9 % IV SOLN
1000.0000 mL | Freq: Once | INTRAVENOUS | Status: AC
  Administered 2013-10-18: 1000 mL via INTRAVENOUS

## 2013-10-18 MED ORDER — ONDANSETRON HCL 4 MG PO TABS: 4.0000 mg | ORAL_TABLET | Freq: Three times a day (TID) | ORAL | Status: DC | PRN

## 2013-10-18 MED ORDER — SODIUM CHLORIDE 0.9 % IV SOLN: 1000.0000 mL | INTRAVENOUS | Status: DC

## 2013-10-18 MED ORDER — ONDANSETRON HCL 4 MG/2ML IJ SOLN
4.0000 mg | Freq: Once | INTRAMUSCULAR | Status: AC
  Administered 2013-10-18: 4 mg via INTRAVENOUS
  Filled 2013-10-18: qty 2

## 2013-10-18 MED ORDER — LOPERAMIDE HCL 2 MG PO CAPS
4.0000 mg | ORAL_CAPSULE | Freq: Once | ORAL | Status: AC
  Administered 2013-10-18: 4 mg via ORAL
  Filled 2013-10-18: qty 2

## 2013-10-18 NOTE — ED Provider Notes (Signed)
CSN: [id number]     Arrival date & time 10/18/13  1741 History   First MD Initiated Contact with Patient 10/18/13 1806     Chief Complaint  Patient presents with  . Emesis  . Diarrhea  . Fever     (Consider location/radiation/quality/duration/timing/severity/associated sxs/prior Treatment) HPI Patient states yesterday she started feeling bad and about midnight she got worse. She reports about 5 episodes of diarrhea and 2 episodes of vomiting. She states she aches all over and she feels cold. She states her temperature was 100.3. She states she has lower abdominal pain when she has the need for diarrhea. She states she feels weak and dizzy when she walks. She denies any food that she thinks may have made her ill, she has not been around anybody that she knows is ill. She does not have a history of GI problems.   PCP [facility]  Past Medical History  Diagnosis Date  . ADD (attention deficit disorder)   . PTSD (post-traumatic stress disorder)   . Pneumonia    Past Surgical History  Procedure Laterality Date  . Abdominal hysterectomy     No family history on file. History  Substance Use Topics  . Smoking status: Current Every Day Smoker -- 1.00 packs/day for 10 years    Types: Cigarettes  . Smokeless tobacco: Never Used  . Alcohol Use: Yes     Comment: occasioanlly   Employed as a waitress  OB History   Grav Para Term Preterm Abortions TAB SAB Ect Mult Living   8 1 1  7  7   1      Review of Systems  All other systems reviewed and are negative.     Allergies  Morphine and related  Home Medications   Prior to Admission medications   Medication Sig Start Date End Date Taking? Authorizing Provider  acetaminophen (TYLENOL) 500 MG tablet Take 500-1,000 mg by mouth every 6 (six) hours as needed for moderate pain or fever.    Yes Historical Provider, MD  ALPRAZolam Prudy Feeler) 1 MG tablet Take 1 mg by mouth 3 (three) times daily.   Yes Historical Provider,  MD  amphetamine-dextroamphetamine (ADDERALL) 20 MG tablet Take 20 mg by mouth every morning.    Yes Historical Provider, MD  Multiple Vitamin (MULTIVITAMIN WITH MINERALS) TABS tablet Take 1 tablet by mouth every evening.   Yes Historical Provider, MD   BP 111/75  Pulse 60  Temp(Src) 97.7 F (36.5 C) (Oral)  Resp 18  Ht 5\' 4"  (1.626 m)  Wt 102 lb (46.267 kg)  BMI 17.50 kg/m2  SpO2 100%  LMP 03/09/2011  Vital signs normal   Physical Exam  Nursing note and vitals reviewed. Constitutional: She is oriented to person, place, and time.  Non-toxic appearance. She does not appear ill. No distress.  Thin  HENT:  Head: Normocephalic and atraumatic.  Right Ear: External ear normal.  Left Ear: External ear normal.  Nose: Nose normal. No mucosal edema or rhinorrhea.  Mouth/Throat: Oropharynx is clear and moist and mucous membranes are normal. No dental abscesses or uvula swelling.  Eyes: Conjunctivae and EOM are normal. Pupils are equal, round, and reactive to light.  Neck: Normal range of motion and full passive range of motion without pain. Neck supple.  Cardiovascular: Normal rate, regular rhythm and normal heart sounds.  Exam reveals no gallop and no friction rub.   No murmur heard. Pulmonary/Chest: Effort normal and breath sounds normal. No respiratory distress. She has  no wheezes. She has no rhonchi. She has no rales. She exhibits no tenderness and no crepitus.  Abdominal: Soft. Normal appearance and bowel sounds are normal. She exhibits no distension. There is no tenderness. There is no rebound and no guarding.  Musculoskeletal: Normal range of motion. She exhibits no edema and no tenderness.  Moves all extremities well.   Neurological: She is alert and oriented to person, place, and time. She has normal strength. No cranial nerve deficit.  Skin: Skin is warm, dry and intact. No rash noted. No erythema. No pallor.  Psychiatric: She has a normal mood and affect. Her speech is normal and  behavior is normal. Her mood appears not anxious.    ED Course  Procedures (including critical care time)  Medications  0.9 %  sodium chloride infusion (0 mLs Intravenous Stopped 10/18/13 1928)    Followed by  0.9 %  sodium chloride infusion (1,000 mLs Intravenous New Bag/Given 10/18/13 1928)    Followed by  0.9 %  sodium chloride infusion (not administered)  ondansetron (ZOFRAN) injection 4 mg (4 mg Intravenous Given 10/18/13 1850)  loperamide (IMODIUM) capsule 4 mg (4 mg Oral Given 10/18/13 1850)    Recheck at 1950 patient has had 2 L of normal saline. She has drank a small amount water without making her feel worse. She states her stomach feels improved. She has the urge to have urination now  Pt states she is ready to be discharged.    Labs Review  Results for orders placed during the hospital encounter of 10/18/13  CBC WITH DIFFERENTIAL      Result Value Ref Range   WBC 6.1  4.0 - 10.5 K/uL   RBC 4.18  3.87 - 5.11 MIL/uL   Hemoglobin 13.4  12.0 - 15.0 g/dL   HCT 10.1  75.1 - 02.5 %   MCV 90.2  78.0 - 100.0 fL   MCH 32.1  26.0 - 34.0 pg   MCHC 35.5  30.0 - 36.0 g/dL   RDW 85.2  77.8 - 24.2 %   Platelets 275  150 - 400 K/uL   Neutrophils Relative % 63  43 - 77 %   Neutro Abs 3.9  1.7 - 7.7 K/uL   Lymphocytes Relative 29  12 - 46 %   Lymphs Abs 1.8  0.7 - 4.0 K/uL   Monocytes Relative 5  3 - 12 %   Monocytes Absolute 0.3  0.1 - 1.0 K/uL   Eosinophils Relative 2  0 - 5 %   Eosinophils Absolute 0.1  0.0 - 0.7 K/uL   Basophils Relative 1  0 - 1 %   Basophils Absolute 0.0  0.0 - 0.1 K/uL  BASIC METABOLIC PANEL      Result Value Ref Range   Sodium 138  137 - 147 mEq/L   Potassium 4.0  3.7 - 5.3 mEq/L   Chloride 106  96 - 112 mEq/L   CO2 21  19 - 32 mEq/L   Glucose, Bld 79  70 - 99 mg/dL   BUN 9  6 - 23 mg/dL   Creatinine, Ser 3.53  0.50 - 1.10 mg/dL   Calcium 9.2  8.4 - 61.4 mg/dL   GFR calc non Af Amer >90  >90 mL/min   GFR calc Af Amer >90  >90 mL/min   Anion gap  11  5 - 15   Laboratory interpretation all normal    Imaging Review No results found.   EKG Interpretation None  MDM   Final diagnoses:  Vomiting and diarrhea  Dehydration   New Prescriptions   ONDANSETRON (ZOFRAN) 4 MG TABLET    Take 1 tablet (4 mg total) by mouth every 8 (eight) hours as needed.    Plan discharge  Devoria Albe, MD, Franz Dell, MD 10/18/13 2029

## 2013-10-18 NOTE — ED Notes (Signed)
Patient states NVD started yesterday. Vx4 Dx7 in 24hours. At this time patient states nausea has subsided and "stomach does not feel upset" patient resting in a position of comfort. A&OX4. Patient states she feels like she is ready to go home.

## 2013-10-18 NOTE — ED Notes (Signed)
Patient verbalizes understanding of discharge instructions, prescription medications, and home care. Patient ambulatory out of department at this time. 

## 2013-10-18 NOTE — ED Notes (Signed)
Pt reports n/v/d body aches, fever,chills, and abd pain

## 2013-10-18 NOTE — Discharge Instructions (Signed)
Drink plenty of fluids (clear liquids) the next 12-24 hours then start the BRAT diet.  Use the zofran for nausea or vomiting. Take imodium OTC for diarrhea. Avoid mild products until the diarrhea is gone. Recheck if you get worse.

## 2013-11-20 ENCOUNTER — Encounter (HOSPITAL_COMMUNITY): Payer: Self-pay | Admitting: Emergency Medicine

## 2014-02-27 ENCOUNTER — Emergency Department (HOSPITAL_COMMUNITY): Payer: Medicaid Other

## 2014-02-27 ENCOUNTER — Encounter (HOSPITAL_COMMUNITY): Payer: Self-pay | Admitting: *Deleted

## 2014-02-27 ENCOUNTER — Emergency Department (HOSPITAL_COMMUNITY)
Admission: EM | Admit: 2014-02-27 | Discharge: 2014-02-27 | Disposition: A | Discharge status: Home / Self Care | Payer: Medicaid Other | Attending: Emergency Medicine | Admitting: Emergency Medicine

## 2014-02-27 DIAGNOSIS — Z79899 Other long term (current) drug therapy: Secondary | ICD-10-CM | POA: Insufficient documentation

## 2014-02-27 DIAGNOSIS — Z90710 Acquired absence of both cervix and uterus: Secondary | ICD-10-CM | POA: Insufficient documentation

## 2014-02-27 DIAGNOSIS — R197 Diarrhea, unspecified: Secondary | ICD-10-CM | POA: Insufficient documentation

## 2014-02-27 DIAGNOSIS — R1030 Lower abdominal pain, unspecified: Secondary | ICD-10-CM

## 2014-02-27 DIAGNOSIS — Z8701 Personal history of pneumonia (recurrent): Secondary | ICD-10-CM | POA: Diagnosis not present

## 2014-02-27 DIAGNOSIS — R1031 Right lower quadrant pain: Secondary | ICD-10-CM

## 2014-02-27 DIAGNOSIS — Z720 Tobacco use: Secondary | ICD-10-CM | POA: Insufficient documentation

## 2014-02-27 DIAGNOSIS — F909 Attention-deficit hyperactivity disorder, unspecified type: Secondary | ICD-10-CM | POA: Insufficient documentation

## 2014-02-27 DIAGNOSIS — F4310 Post-traumatic stress disorder, unspecified: Secondary | ICD-10-CM | POA: Diagnosis not present

## 2014-02-27 DIAGNOSIS — R112 Nausea with vomiting, unspecified: Secondary | ICD-10-CM

## 2014-02-27 DIAGNOSIS — R51 Headache: Secondary | ICD-10-CM | POA: Diagnosis present

## 2014-02-27 LAB — COMPREHENSIVE METABOLIC PANEL
ALT: 10 U/L (ref 0–35)
ANION GAP: 4 — AB (ref 5–15)
AST: 17 U/L (ref 0–37)
Albumin: 3.7 g/dL (ref 3.5–5.2)
Alkaline Phosphatase: 59 U/L (ref 39–117)
BUN: 8 mg/dL (ref 6–23)
CO2: 23 mmol/L (ref 19–32)
CREATININE: 0.61 mg/dL (ref 0.50–1.10)
Calcium: 8.7 mg/dL (ref 8.4–10.5)
Chloride: 111 mmol/L (ref 96–112)
GFR calc non Af Amer: >90 mL/min (ref >90)
Glucose, Bld: 96 mg/dL (ref 70–99)
Potassium: 3.4 mmol/L — ABNORMAL LOW (ref 3.5–5.1)
Sodium: 138 mmol/L (ref 135–145)
TOTAL PROTEIN: 7.5 g/dL (ref 6.0–8.3)
Total Bilirubin: 0.3 mg/dL (ref 0.3–1.2)

## 2014-02-27 LAB — CBC WITH DIFFERENTIAL/PLATELET
Basophils Absolute: 0 10*3/uL (ref 0.0–0.1)
Basophils Relative: 0 % (ref 0–1)
EOS ABS: 0.2 10*3/uL (ref 0.0–0.7)
Eosinophils Relative: 1 % (ref 0–5)
HEMATOCRIT: 37.6 % (ref 36.0–46.0)
Hemoglobin: 12.9 g/dL (ref 12.0–15.0)
Lymphocytes Relative: 7 % — ABNORMAL LOW (ref 12–46)
Lymphs Abs: 1 10*3/uL (ref 0.7–4.0)
MCH: 31.8 pg (ref 26.0–34.0)
MCHC: 34.3 g/dL (ref 30.0–36.0)
MCV: 92.6 fL (ref 78.0–100.0)
MONO ABS: 0.6 10*3/uL (ref 0.1–1.0)
MONOS PCT: 4 % (ref 3–12)
NEUTROS PCT: 88 % — AB (ref 43–77)
Neutro Abs: 12.8 10*3/uL — ABNORMAL HIGH (ref 1.7–7.7)
Platelets: 241 10*3/uL (ref 150–400)
RBC: 4.06 MIL/uL (ref 3.87–5.11)
RDW: 12.9 % (ref 11.5–15.5)
WBC: 14.5 10*3/uL — ABNORMAL HIGH (ref 4.0–10.5)

## 2014-02-27 MED ORDER — SODIUM CHLORIDE 0.9 % IV SOLN
1000.0000 mL | Freq: Once | INTRAVENOUS | Status: AC
  Administered 2014-02-27: 1000 mL via INTRAVENOUS

## 2014-02-27 MED ORDER — ONDANSETRON HCL 4 MG/2ML IJ SOLN
4.0000 mg | Freq: Once | INTRAMUSCULAR | Status: AC
  Administered 2014-02-27: 4 mg via INTRAVENOUS
  Filled 2014-02-27: qty 2

## 2014-02-27 MED ORDER — METOCLOPRAMIDE HCL 5 MG/ML IJ SOLN
10.0000 mg | Freq: Once | INTRAMUSCULAR | Status: AC
Start: 2014-02-27 — End: 2014-02-27
  Administered 2014-02-27: 10 mg via INTRAVENOUS
  Filled 2014-02-27: qty 2

## 2014-02-27 MED ORDER — SODIUM CHLORIDE 0.9 % IV SOLN: 1000.0000 mL | INTRAVENOUS | Status: DC

## 2014-02-27 MED ORDER — ONDANSETRON 8 MG PO TBDP
8.0000 mg | ORAL_TABLET | Freq: Three times a day (TID) | ORAL | Status: DC | PRN
Start: 2014-02-27 — End: 2015-03-05

## 2014-02-27 MED ORDER — HYDROMORPHONE HCL 1 MG/ML IJ SOLN
1.0000 mg | Freq: Once | INTRAMUSCULAR | Status: AC
  Administered 2014-02-27: 1 mg via INTRAVENOUS
  Filled 2014-02-27: qty 1

## 2014-02-27 MED ORDER — IOHEXOL 300 MG/ML  SOLN
100.0000 mL | Freq: Once | INTRAMUSCULAR | Status: AC | PRN
  Administered 2014-02-27: 100 mL via INTRAVENOUS

## 2014-02-27 NOTE — ED Provider Notes (Signed)
CSN: [id number]     Arrival date & time 02/27/14  2147 History  This chart was scribed for Hilario Quarry, MD by Richarda Overlie, ED Scribe. This patient was seen in room APA18/APA18 and the patient's care was started 10:18 PM.    Chief Complaint  Patient presents with  . Headache   Patient is a 39 y.o. female presenting with headaches. The history is provided by the patient. No language interpreter was used.  Headache Pain location:  Generalized Severity currently:  Unable to specify Severity at highest:  Unable to specify Duration:  1 day Ineffective treatments:  None tried Associated symptoms: nausea and vomiting    HPI Comments: Linda Calderon is a 39 y.o. female who presents to the Emergency Department complaining of a HA that started yesterday at [facility]. Pt says she started sweating, felt nauseated and vomited yesterday during the onset of her symptoms. She reports she has had 7-8 episodes of diarrhea in the last 24 hours and 3-4 episodes of vomiting. She says that she has not been able to keep anything down today. Pt reports no known sick contacts. She reports a history of hysterectomy. Pt denies any urinary tract symptoms or blood in her stool. She reports no pertinent past medical history at this time.   Past Medical History  Diagnosis Date  . ADD (attention deficit disorder)   . PTSD (post-traumatic stress disorder)   . Pneumonia    Past Surgical History  Procedure Laterality Date  . Abdominal hysterectomy     No family history on file. History  Substance Use Topics  . Smoking status: Current Every Day Smoker -- 1.00 packs/day for 10 years    Types: Cigarettes  . Smokeless tobacco: Never Used  . Alcohol Use: No   OB History    Gravida Para Term Preterm AB TAB SAB Ectopic Multiple Living   8 1 1  7  7   1      Review of Systems  Gastrointestinal: Positive for nausea and vomiting.  Neurological: Positive for headaches.  All other systems reviewed and are  negative.     Allergies  Morphine and related  Home Medications   Prior to Admission medications   Medication Sig Start Date End Date Taking? Authorizing Provider  acetaminophen (TYLENOL) 500 MG tablet Take 500-1,000 mg by mouth every 6 (six) hours as needed for moderate pain or fever.     Historical Provider, MD  ALPRAZolam ) 1 MG tablet Take 1 mg by mouth 3 (three) times daily.    Historical Provider, MD  amphetamine-dextroamphetamine (ADDERALL) 20 MG tablet Take 20 mg by mouth every morning.     Historical Provider, MD  Multiple Vitamin (MULTIVITAMIN WITH MINERALS) TABS tablet Take 1 tablet by mouth every evening.    Historical Provider, MD  ondansetron (ZOFRAN) 4 MG tablet Take 1 tablet (4 mg total) by mouth every 8 (eight) hours as needed. 10/18/13   10/20/13, MD   BP 96/68 mmHg  Pulse 110  Temp(Src) 98.2 F (36.8 C) (Oral)  Resp 22  Ht 5\' 4"  (1.626 m)  Wt 101 lb (45.813 kg)  BMI 17.33 kg/m2  SpO2 100%  LMP 03/09/2011 Physical Exam  Constitutional: She is oriented to person, place, and time. She appears well-developed and well-nourished.  HENT:  Head: Normocephalic and atraumatic.  Right Ear: External ear normal.  Left Ear: External ear normal.  Nose: Nose normal.  Mouth/Throat: Oropharynx is clear and moist.  Eyes: Conjunctivae and EOM  are normal. Pupils are equal, round, and reactive to light. Right eye exhibits no discharge. Left eye exhibits no discharge.  Neck: Normal range of motion. Neck supple. No tracheal deviation present.  Cardiovascular: Normal rate, normal heart sounds and intact distal pulses.   Pulmonary/Chest: Effort normal. No respiratory distress.  Abdominal: Soft. Bowel sounds are normal. She exhibits no distension. There is tenderness.    RLQ tenderness.   Neurological: She is alert and oriented to person, place, and time.  Skin: Skin is warm and dry.  Psychiatric: She has a normal mood and affect. Her behavior is normal.  Nursing note  and vitals reviewed.   ED Course  Procedures   DIAGNOSTIC STUDIES: Oxygen Saturation is 100% on RA, normal by my interpretation.    COORDINATION OF CARE: 10:21 PM Discussed treatment plan with pt at bedside and pt agreed to plan.   Labs Review Labs Reviewed  CBC WITH DIFFERENTIAL/PLATELET - Abnormal; Notable for the following:    WBC 14.5 (*)    Neutrophils Relative % 88 (*)    Neutro Abs 12.8 (*)    Lymphocytes Relative 7 (*)    All other components within normal limits  COMPREHENSIVE METABOLIC PANEL - Abnormal; Notable for the following:    Potassium 3.4 (*)    Anion gap 4 (*)    All other components within normal limits  URINALYSIS, ROUTINE W REFLEX MICROSCOPIC    Imaging Review Ct Abdomen Pelvis W Contrast  02/27/2014   CLINICAL DATA:  Headaches, sweating, nausea and vomiting, and right lower quadrant tenderness.  EXAM: CT ABDOMEN AND PELVIS WITH CONTRAST  TECHNIQUE: Multidetector CT imaging of the abdomen and pelvis was performed using the standard protocol following bolus administration of intravenous contrast.  CONTRAST:  OMNIPAQUE IOHEXOL 300 MG/ML  SOLN  COMPARISON:  03/26/2013  FINDINGS: Slight residual infiltration in the right middle lung, significantly improved since prior study.  The liver, spleen, gallbladder, pancreas, adrenal glands, kidneys, abdominal aorta, inferior vena cava, and retroperitoneal lymph nodes are unremarkable. Small accessory spleen. Stomach, small bowel, and colon are mostly decompressed. Of the under distention limits evaluation for wall thickening. No free air or free fluid in the abdomen.  Pelvis: The appendix is normal. No free or loculated pelvic fluid collections. No pelvic mass or lymphadenopathy. Uterus is surgically absent. Bladder wall is not thickened. No destructive bone lesions.  IMPRESSION: No focal acute process demonstrated in the abdomen or pelvis. Appendix is normal.   Electronically Signed   By: Burman Nieves M.D.   On:  02/27/2014 23:17     EKG Interpretation None      MDM   Final diagnoses:  Nausea vomiting and diarrhea  Lower abdominal pain   I personally performed the services described in this documentation, which was scribed in my presence. The recorded information has been reviewed and considered.      Hilario Quarry, MD 02/27/14 [phone]

## 2014-02-27 NOTE — Discharge Instructions (Signed)
Abdominal Pain, Women °Abdominal (stomach, pelvic, or belly) pain can be caused by many things. It is important to tell your doctor: °· The location of the pain. °· Does it come and go or is it present all the time? °· Are there things that start the pain (eating certain foods, exercise)? °· Are there other symptoms associated with the pain (fever, nausea, vomiting, diarrhea)? °All of this is helpful to know when trying to find the cause of the pain. °CAUSES  °· Stomach: virus or bacteria infection, or ulcer. °· Intestine: appendicitis (inflamed appendix), regional ileitis (Crohn's disease), ulcerative colitis (inflamed colon), irritable bowel syndrome, diverticulitis (inflamed diverticulum of the colon), or cancer of the stomach or intestine. °· Gallbladder disease or stones in the gallbladder. °· Kidney disease, kidney stones, or infection. °· Pancreas infection or cancer. °· Fibromyalgia (pain disorder). °· Diseases of the female organs: °· Uterus: fibroid (non-cancerous) tumors or infection. °· Fallopian tubes: infection or tubal pregnancy. °· Ovary: cysts or tumors. °· Pelvic adhesions (scar tissue). °· Endometriosis (uterus lining tissue growing in the pelvis and on the pelvic organs). °· Pelvic congestion syndrome (female organs filling up with blood just before the menstrual period). °· Pain with the menstrual period. °· Pain with ovulation (producing an egg). °· Pain with an IUD (intrauterine device, birth control) in the uterus. °· Cancer of the female organs. °· Functional pain (pain not caused by a disease, may improve without treatment). °· Psychological pain. °· Depression. °DIAGNOSIS  °Your doctor will decide the seriousness of your pain by doing an examination. °· Blood tests. °· X-rays. °· Ultrasound. °· CT scan (computed tomography, special type of X-Beulah Matusek). °· MRI (magnetic resonance imaging). °· Cultures, for infection. °· Barium enema (dye inserted in the large intestine, to better view it with  X-rays). °· Colonoscopy (looking in intestine with a lighted tube). °· Laparoscopy (minor surgery, looking in abdomen with a lighted tube). °· Major abdominal exploratory surgery (looking in abdomen with a large incision). °TREATMENT  °The treatment will depend on the cause of the pain.  °· Many cases can be observed and treated at home. °· Over-the-counter medicines recommended by your caregiver. °· Prescription medicine. °· Antibiotics, for infection. °· Birth control pills, for painful periods or for ovulation pain. °· Hormone treatment, for endometriosis. °· Nerve blocking injections. °· Physical therapy. °· Antidepressants. °· Counseling with a psychologist or psychiatrist. °· Minor or major surgery. °HOME CARE INSTRUCTIONS  °· Do not take laxatives, unless directed by your caregiver. °· Take over-the-counter pain medicine only if ordered by your caregiver. Do not take aspirin because it can cause an upset stomach or bleeding. °· Try a clear liquid diet (broth or water) as ordered by your caregiver. Slowly move to a bland diet, as tolerated, if the pain is related to the stomach or intestine. °· Have a thermometer and take your temperature several times a day, and record it. °· Bed rest and sleep, if it helps the pain. °· Avoid sexual intercourse, if it causes pain. °· Avoid stressful situations. °· Keep your follow-up appointments and tests, as your caregiver orders. °· If the pain does not go away with medicine or surgery, you may try: °¨ Acupuncture. °¨ Relaxation exercises (yoga, meditation). °¨ Group therapy. °¨ Counseling. °SEEK MEDICAL CARE IF:  °· You notice certain foods cause stomach pain. °· Your home care treatment is not helping your pain. °· You need stronger pain medicine. °· You want your IUD removed. °· You feel faint or   lightheaded.  You develop nausea and vomiting.  You develop a rash.  You are having side effects or an allergy to your medicine. SEEK IMMEDIATE MEDICAL CARE IF:   Your  pain does not go away or gets worse.  You have a fever.  Your pain is felt only in portions of the abdomen. The right side could possibly be appendicitis. The left lower portion of the abdomen could be colitis or diverticulitis.  You are passing blood in your stools (bright red or black tarry stools, with or without vomiting).  You have blood in your urine.  You develop chills, with or without a fever.  You pass out. MAKE SURE YOU:   Understand these instructions.  Will watch your condition.  Will get help right away if you are not doing well or get worse. Document Released: 11/02/2006 Document Revised: 05/22/2013 Document Reviewed: 11/22/2008 [facility] Patient Information 2015 [place], [place]. This information is not intended to replace advice given to you by your health care provider. Make sure you discuss any questions you have with your health care provider. Diarrhea Diarrhea is watery poop (stool). It can make you feel weak, tired, thirsty, or give you a dry mouth (signs of dehydration). Watery poop is a sign of another problem, most often an infection. It often lasts 2-3 days. It can last longer if it is a sign of something serious. Take care of yourself as told by your doctor. HOME CARE   Drink 1 cup (8 ounces) of fluid each time you have watery poop.  Do not drink the following fluids:  Those that contain simple sugars (fructose, glucose, galactose, lactose, sucrose, maltose).  Sports drinks.  Fruit juices.  Whole milk products.  Sodas.  Drinks with caffeine (coffee, tea, soda) or alcohol.  Oral rehydration solution may be used if the doctor says it is okay. You may make your own solution. Follow this recipe:   - teaspoon table salt.   teaspoon baking soda.   teaspoon salt substitute containing potassium chloride.  1 tablespoons sugar.  1 liter (34 ounces) of water.  Avoid the following foods:  High fiber foods, such as raw fruits and  vegetables.  Nuts, seeds, and whole grain breads and cereals.   Those that are sweetened with sugar alcohols (xylitol, sorbitol, mannitol).  Try eating the following foods:  Starchy foods, such as rice, toast, pasta, low-sugar cereal, oatmeal, baked potatoes, crackers, and bagels.  Bananas.  Applesauce.  Eat probiotic-rich foods, such as yogurt and milk products that are fermented.  Wash your hands well after each time you have watery poop.  Only take medicine as told by your doctor.  Take a warm bath to help lessen burning or pain from having watery poop. GET HELP RIGHT AWAY IF:   You cannot drink fluids without throwing up (vomiting).  You keep throwing up.  You have blood in your poop, or your poop looks black and tarry.  You do not pee (urinate) in 6-8 hours, or there is only a small amount of very dark pee.  You have belly (abdominal) pain that gets worse or stays in the same spot (localizes).  You are weak, dizzy, confused, or light-headed.  You have a very bad headache.  Your watery poop gets worse or does not get better.  You have a fever or lasting symptoms for more than 2-3 days.  You have a fever and your symptoms suddenly get worse. MAKE SURE YOU:   Understand these instructions.  Will watch  your condition.  Will get help right away if you are not doing well or get worse. Document Released: 06/24/2007 Document Revised: 05/22/2013 Document Reviewed: 09/13/2011 [facility] Patient Information 2015 [place], [place]. This information is not intended to replace advice given to you by your health care provider. Make sure you discuss any questions you have with your health care provider. Nausea and Vomiting Nausea means you feel sick to your stomach. Throwing up (vomiting) is a reflex where stomach contents come out of your mouth. HOME CARE   Take medicine as told by your doctor.  Do not force yourself to eat. However, you do need to drink fluids.  If you  feel like eating, eat a normal diet as told by your doctor.  Eat rice, wheat, potatoes, bread, lean meats, yogurt, fruits, and vegetables.  Avoid high-fat foods.  Drink enough fluids to keep your pee (urine) clear or pale yellow.  Ask your doctor how to replace body fluid losses (rehydrate). Signs of body fluid loss (dehydration) include:  Feeling very thirsty.  Dry lips and mouth.  Feeling dizzy.  Dark pee.  Peeing less than normal.  Feeling confused.  Fast breathing or heart rate. GET HELP RIGHT AWAY IF:   You have blood in your throw up.  You have black or bloody poop (stool).  You have a bad headache or stiff neck.  You feel confused.  You have bad belly (abdominal) pain.  You have chest pain or trouble breathing.  You do not pee at least once every 8 hours.  You have cold, clammy skin.  You keep throwing up after 24 to 48 hours.  You have a fever. MAKE SURE YOU:   Understand these instructions.  Will watch your condition.  Will get help right away if you are not doing well or get worse. Document Released: 06/24/2007 Document Revised: 03/30/2011 Document Reviewed: 06/06/2010 [facility] Patient Information 2015 [place], [place]. This information is not intended to replace advice given to you by your health care provider. Make sure you discuss any questions you have with your health care provider.

## 2014-02-27 NOTE — ED Notes (Signed)
Pt states she has had a headache since yesterday and she can't keep nothing down.

## 2014-03-07 ENCOUNTER — Emergency Department (HOSPITAL_COMMUNITY)
Admission: EM | Admit: 2014-03-07 | Discharge: 2014-03-07 | Disposition: A | Discharge status: Home / Self Care | Payer: Medicaid Other | Attending: Emergency Medicine | Admitting: Emergency Medicine

## 2014-03-07 ENCOUNTER — Encounter (HOSPITAL_COMMUNITY): Payer: Self-pay | Admitting: *Deleted

## 2014-03-07 DIAGNOSIS — Z79899 Other long term (current) drug therapy: Secondary | ICD-10-CM | POA: Diagnosis not present

## 2014-03-07 DIAGNOSIS — B9689 Other specified bacterial agents as the cause of diseases classified elsewhere: Secondary | ICD-10-CM

## 2014-03-07 DIAGNOSIS — Z720 Tobacco use: Secondary | ICD-10-CM | POA: Insufficient documentation

## 2014-03-07 DIAGNOSIS — F909 Attention-deficit hyperactivity disorder, unspecified type: Secondary | ICD-10-CM | POA: Diagnosis not present

## 2014-03-07 DIAGNOSIS — Z8701 Personal history of pneumonia (recurrent): Secondary | ICD-10-CM | POA: Insufficient documentation

## 2014-03-07 DIAGNOSIS — R300 Dysuria: Secondary | ICD-10-CM | POA: Diagnosis present

## 2014-03-07 DIAGNOSIS — N760 Acute vaginitis: Secondary | ICD-10-CM | POA: Diagnosis not present

## 2014-03-07 DIAGNOSIS — Z87440 Personal history of urinary (tract) infections: Secondary | ICD-10-CM | POA: Insufficient documentation

## 2014-03-07 DIAGNOSIS — Z3202 Encounter for pregnancy test, result negative: Secondary | ICD-10-CM | POA: Diagnosis not present

## 2014-03-07 HISTORY — DX: Urinary tract infection, site not specified: N39.0

## 2014-03-07 LAB — URINALYSIS, ROUTINE W REFLEX MICROSCOPIC
Bilirubin Urine: NEGATIVE
Glucose, UA: NEGATIVE mg/dL
Hgb urine dipstick: NEGATIVE
KETONES UR: NEGATIVE mg/dL
Nitrite: NEGATIVE
PH: 6.5 (ref 5.0–8.0)
Protein, ur: NEGATIVE mg/dL
Specific Gravity, Urine: 1.01 (ref 1.005–1.030)
Urobilinogen, UA: 0.2 mg/dL (ref 0.0–1.0)

## 2014-03-07 LAB — URINE MICROSCOPIC-ADD ON

## 2014-03-07 LAB — PREGNANCY, URINE: Preg Test, Ur: NEGATIVE

## 2014-03-07 LAB — WET PREP, GENITAL
Trich, Wet Prep: NONE SEEN
YEAST WET PREP: NONE SEEN

## 2014-03-07 MED ORDER — METRONIDAZOLE 500 MG PO TABS: 500.0000 mg | ORAL_TABLET | Freq: Two times a day (BID) | ORAL | Status: DC

## 2014-03-07 NOTE — ED Provider Notes (Signed)
CSN: [id number]     Arrival date & time 03/07/14  1457 History   First MD Initiated Contact with Patient 03/07/14 1829     Chief Complaint  Patient presents with  . Abdominal Pain  . Dysuria      HPI Pt was seen at 1920. Per pt, c/o gradual onset and persistence of constant dysuria for the past 3 days. Has been associated with vaginal discharge. State she tried to use an OTC yeast infection product without change in her symptoms.  Denies pelvic pain, no dysuria/hematuria, no flank pain, no N/V/D, no fevers, no rash.     Past Medical History  Diagnosis Date  . ADD (attention deficit disorder)   . PTSD (post-traumatic stress disorder)   . Pneumonia   . UTI (lower urinary tract infection)    Past Surgical History  Procedure Laterality Date  . Abdominal hysterectomy      History  Substance Use Topics  . Smoking status: Current Every Day Smoker -- 1.00 packs/day for 10 years    Types: Cigarettes  . Smokeless tobacco: Never Used  . Alcohol Use: No   OB History    Gravida Para Term Preterm AB TAB SAB Ectopic Multiple Living   8 1 1  7  7   1      Review of Systems ROS: Statement: All systems negative except as marked or noted in the HPI; Constitutional: Negative for fever and chills. ; ; Eyes: Negative for eye pain, redness and discharge. ; ; ENMT: Negative for ear pain, hoarseness, nasal congestion, sinus pressure and sore throat. ; ; Cardiovascular: Negative for chest pain, palpitations, diaphoresis, dyspnea and peripheral edema. ; ; Respiratory: Negative for cough, wheezing and stridor. ; ; Gastrointestinal: Negative for nausea, vomiting, diarrhea, abdominal pain, blood in stool, hematemesis, jaundice and rectal bleeding. . ; ; Genitourinary: +dysuria. Negative for flank pain and hematuria. ; ; Musculoskeletal: Negative for back pain and neck pain. Negative for swelling and trauma.; ; GYN:  No vaginal bleeding, +vaginal discharge, no vulvar pain. ;; Skin: Negative for pruritus,  rash, abrasions, blisters, bruising and skin lesion.; ; Neuro: Negative for headache, lightheadedness and neck stiffness. Negative for weakness, altered level of consciousness , altered mental status, extremity weakness, paresthesias, involuntary movement, seizure and syncope.        Allergies  Morphine and related  Home Medications   Prior to Admission medications   Medication Sig Start Date End Date Taking? Authorizing Provider  ALPRAZolam ) 1 MG tablet Take 1 mg by mouth 3 (three) times daily.   Yes Historical Provider, MD  amphetamine-dextroamphetamine (ADDERALL) 20 MG tablet Take 10 mg by mouth every morning.    Yes Historical Provider, MD  Aspirin-Acetaminophen-Caffeine (GOODY HEADACHE PO) Take 1 packet by mouth 2 (two) times daily as needed (FOR PAIN).   Yes Historical Provider, MD  Miconazole Nitrate (MONISTAT 3 COMBINATION PACK VA) Place 1 suppository vaginally at bedtime. 3 night course   Yes Historical Provider, MD  Multiple Vitamin (MULTIVITAMIN WITH MINERALS) TABS tablet Take 1 tablet by mouth every evening.   Yes Historical Provider, MD  ondansetron (ZOFRAN ODT) 8 MG disintegrating tablet Take 1 tablet (8 mg total) by mouth every 8 (eight) hours as needed for nausea or vomiting. Patient not taking: Reported on 03/07/2014 02/27/14   04/28/14, MD  ondansetron (ZOFRAN) 4 MG tablet Take 1 tablet (4 mg total) by mouth every 8 (eight) hours as needed. Patient not taking: Reported on 02/27/2014 10/18/13   Iva  Hildred Laser, MD   BP 112/86 mmHg  Pulse 75  Temp(Src) 97.4 F (36.3 C) (Oral)  Resp 14  Ht 5\' 4"  (1.626 m)  Wt 101 lb (45.813 kg)  BMI 17.33 kg/m2  SpO2 98%  LMP 03/09/2011 Physical Exam  1925: Physical examination:  Nursing notes reviewed; Vital signs and O2 SAT reviewed;  Constitutional: Well developed, Well nourished, Well hydrated, In no acute distress; Head:  Normocephalic, atraumatic; Eyes: EOMI, PERRL, No scleral icterus; ENMT: Mouth and pharynx normal, Mucous  membranes moist; Neck: Supple, Full range of motion, No lymphadenopathy; Cardiovascular: Regular rate and rhythm, No murmur, rub, or gallop; Respiratory: Breath sounds clear & equal bilaterally, No rales, rhonchi, wheezes.  Speaking full sentences with ease, Normal respiratory effort/excursion; Chest: Nontender, Movement normal; Abdomen: Soft, Nontender, Nondistended, Normal bowel sounds; Genitourinary: No CVA tenderness. Pelvic exam performed with permission of pt and female ED tech assist during exam.  External genitalia w/o lesions. Vaginal vault with thick white discharge.  Cervix w/o lesions, not friable, GC/chlam and wet prep obtained and sent to lab.  Bimanual exam w/o CMT, uterine or adnexal tenderness.; Spine:  No midline CS, TS, LS tenderness. +mild TTP right lower lumbar paraspinal muscles. No rash.;; Extremities: Pulses normal, No tenderness, No edema, No calf edema or asymmetry.; Neuro: AA&Ox3, Major CN grossly intact.  Speech clear. No gross focal motor or sensory deficits in extremities. Climbs on and off stretcher easily by herself. Gait steady.; Skin: Color normal, Warm, Dry.   ED Course  Procedures     EKG Interpretation None      MDM  MDM Reviewed: previous chart, nursing note and vitals Reviewed previous: labs Interpretation: labs     Results for orders placed or performed during the hospital encounter of 03/07/14  Wet prep, genital  Result Value Ref Range   Yeast Wet Prep HPF POC NONE SEEN NONE SEEN   Trich, Wet Prep NONE SEEN NONE SEEN   Clue Cells Wet Prep HPF POC MANY (A) NONE SEEN   WBC, Wet Prep HPF POC FEW (A) NONE SEEN  Urinalysis, Routine w reflex microscopic  Result Value Ref Range   Color, Urine YELLOW YELLOW   APPearance CLEAR CLEAR   Specific Gravity, Urine 1.010 1.005 - 1.030   pH 6.5 5.0 - 8.0   Glucose, UA NEGATIVE NEGATIVE mg/dL   Hgb urine dipstick NEGATIVE NEGATIVE   Bilirubin Urine NEGATIVE NEGATIVE   Ketones, ur NEGATIVE NEGATIVE mg/dL    Protein, ur NEGATIVE NEGATIVE mg/dL   Urobilinogen, UA 0.2 0.0 - 1.0 mg/dL   Nitrite NEGATIVE NEGATIVE   Leukocytes, UA SMALL (A) NEGATIVE  Pregnancy, urine  Result Value Ref Range   Preg Test, Ur NEGATIVE NEGATIVE  Urine microscopic-add on  Result Value Ref Range   Squamous Epithelial / LPF FEW (A) RARE   WBC, UA 0-2 <3 WBC/hpf   Bacteria, UA RARE RARE   Ct Abdomen Pelvis W Contrast 02/27/2014   CLINICAL DATA:  Headaches, sweating, nausea and vomiting, and right lower quadrant tenderness.  EXAM: CT ABDOMEN AND PELVIS WITH CONTRAST  TECHNIQUE: Multidetector CT imaging of the abdomen and pelvis was performed using the standard protocol following bolus administration of intravenous contrast.  CONTRAST:  04/28/2014 OMNIPAQUE IOHEXOL 300 MG/ML  SOLN  COMPARISON:  03/26/2013  FINDINGS: Slight residual infiltration in the right middle lung, significantly improved since prior study.  The liver, spleen, gallbladder, pancreas, adrenal glands, kidneys, abdominal aorta, inferior vena cava, and retroperitoneal lymph nodes are unremarkable. Small accessory spleen.  Stomach, small bowel, and colon are mostly decompressed. Of the under distention limits evaluation for wall thickening. No free air or free fluid in the abdomen.  Pelvis: The appendix is normal. No free or loculated pelvic fluid collections. No pelvic mass or lymphadenopathy. Uterus is surgically absent. Bladder wall is not thickened. No destructive bone lesions.  IMPRESSION: No focal acute process demonstrated in the abdomen or pelvis. Appendix is normal.   Electronically Signed   By: Burman Nieves M.D.   On: 02/27/2014 23:17    2025:  Will tx for BV.  Dx and testing d/w pt.  Questions answered.  Verb understanding, agreeable to d/c home with outpt f/u.   Samuel Jester, DO 03/10/14 [id number]

## 2014-03-07 NOTE — Discharge Instructions (Signed)
°Emergency Department Resource Guide °1) Find a Doctor and Pay Out of Pocket °Although you won't have to find out who is covered by your insurance plan, it is a good idea to ask around and get recommendations. You will then need to call the office and see if the doctor you have chosen will accept you as a new patient and what types of options they offer for patients who are self-pay. Some doctors offer discounts or will set up payment plans for their patients who do not have insurance, but you will need to ask so you aren't surprised when you get to your appointment. ° °2) Contact Your Local Health Department °Not all health departments have doctors that can see patients for sick visits, but many do, so it is worth a call to see if yours does. If you don't know where your local health department is, you can check in your phone book. The CDC also has a tool to help you locate your state's health department, and many state websites also have listings of all of their local health departments. ° °3) Find a Walk-in Clinic °If your illness is not likely to be very severe or complicated, you may want to try a walk in clinic. These are popping up all over the country in pharmacies, drugstores, and shopping centers. They're usually staffed by nurse practitioners or physician assistants that have been trained to treat common illnesses and complaints. They're usually fairly quick and inexpensive. However, if you have serious medical issues or chronic medical problems, these are probably not your best option. ° °No Primary Care Doctor: °- Call [facility] at  832-8000 - they can help you locate a primary care doctor that  accepts your insurance, provides certain services, etc. °- Physician Referral Service- 1-800-533-3463 ° °Chronic Pain Problems: °Organization         Address  Phone   Notes  °[facility]  (336) 297-2271 Patients need to be referred by their primary care doctor.  ° °Medication  Assistance: °Organization         Address  Phone   Notes  °[place] [facility] [address] °[place], [place] [address] (336) 641-8030 --Must be a resident of [place] °-- Must have NO insurance coverage whatsoever (no Medicaid/ Medicare, etc.) °-- The pt. MUST have a primary care doctor that directs their care regularly and follows them in the community °  °[facility]  (866) 331-1348   °[employer]  (888) 892-1162   ° °Agencies that provide inexpensive medical care: °Organization         Address  Phone   Notes  °[facility]  (336) 832-8035   °[facility]    (336) 832-7272   °[facility] [address] °[place], [place] [address] (336) 832-4777   °[facility] [address], °[place] (336) 271-4999   °[facility]    (336) 373-0678   °[facility]    (336) 272-1050   °[facility] ° [address], [place] Phone:  (336) 832-4444, Fax:  (336) 832-4440 Hours of Operation:  9 am - 6 pm, M-F.  Also accepts Medicaid/Medicare and self-pay.  °[facility] ° [address], [place] Phone: (336) 832-3150, Fax: (336) [phone]. Hours of Operation:  8:30 am - 5:30 pm, M-F.  Also accepts Medicaid and self-pay.  °[facility] [place] [address], [place] Phone: (336) 878-6027   °[facility] [address], [place], [place] (336)723-1848, Ext. 123 Mondays & Thursdays: 7-9 AM.  First 15 patients are seen on a first come, first serve basis. °  ° °Medicaid-accepting [place] Providers: ° °Organization         Address  Phone   Notes  °[facility] [address], [place] (336) 641-2100 Also accepts self-pay patients.  °[facility] [address], [place] ° (336) 856-9996   °[facility] [address], [place]  (336) 288-8857   °[facility] [address] [place] [address], [place] (336) 299-7000   °Veita Bland [address], [place]  ° (336) 373-1557 Only accepts [place] Access Medicaid patients after they have their name applied to their card.  ° °Self-Pay (no insurance) in [place]: ° °Organization         Address  Phone   Notes  °Sickle Cell Patients, [facility] [address], [place] (336) 832-1970   °[facility] [address], [place] (336) 832-4400   °[facility] ° [address], [place] (336) 992-4800   °[facility]/Dr. Osei-Bonsu ° [address] [place] [address], [place] or [address], [place] (336) [phone] Phone number for both [place] and [place] locations is the same.  °[facility] [address], [place] (336) 299-0000   °[facility] [address] [place] [address], [place] or [address] (336) 852-7530 °(336) 878-2260   °[facility] [address], [place] (336) 350-1642, phone; (336) 294-5005, fax Sees patients 1st and 3rd Saturday of every month.  Must not qualify for public or private insurance (i.e. Medicaid, Medicare, [place] Health Choice, Veterans' Benefits) • Household income should be no more than 200% of the poverty level •The clinic cannot treat you if you are pregnant or think you are pregnant • Sexually transmitted diseases are not treated at the clinic.  ° ° °Dental Care: °Organization         Address  Phone  Notes  °[place] [facility] [address], [place] (336) 641-6152 Accepts children up to age 21 who are enrolled in Medicaid or [place] Health Choice; pregnant women with a Medicaid card; and children who have applied for Medicaid or [place] Health Choice, but were declined, whose parents can pay a reduced fee at time of service.  °[place]  [facility] [place]  [address], [place] (336) 641-7733 Accepts children up to age 21 who are enrolled in Medicaid or [place] Health Choice; pregnant women with a Medicaid card; and children who have applied for Medicaid or [place] Health Choice, but were declined, whose parents can pay a reduced fee at time of service.  °[facility] ° [address], [place] (336) 641-4533 Patients are seen by appointment only. Walk-ins are not accepted. [facility] will see patients 18 years of age and older. °Monday - Tuesday (8am-5pm) °Most Wednesdays (8:30-5pm) °$30 per visit, cash only  °[facility] ° [address], [place] (336) 641-4533 Patients are seen by appointment only. Walk-ins are not accepted. [facility] will see patients 18 years of age and older. °One   Wednesday Evening (Monthly: Volunteer Based).  $30 per visit, cash only  °[facility]  (919) 537-3737 for adults; Children under age 4, call Graduate Pediatric Dentistry at ([phone]) [phone]. Children aged 4-14, please call ([phone]) [phone] to request a pediatric application. ° Dental services are provided in all areas of dental care including fillings, crowns and bridges, complete and partial dentures, implants, gum treatment, root canals, and extractions. Preventive care is also provided. Treatment is provided to both adults and children. °Patients are selected via a lottery and there is often a waiting list. °  °[facility] [address], °[place] ° (336) 763-8833 [email or web address] °  °[facility] [address], [place], [place] (336)723-1848, Ext. 123 Second and Fourth Thursday of each month, opens at 6:30 AM; Clinic ends at 9 AM.  Patients are seen on a first-come first-served basis, and a limited number are seen during each clinic.  ° °[facility] ° [address], [place], [place] (336) [phone]    Eligibility Requirements °You must have lived in [place], [place], or [place] counties for at least the last three months. °  You cannot be eligible for state or federal sponsored healthcare insurance, including Veterans Administration, Medicaid, or Medicare. °  You generally cannot be eligible for healthcare insurance through your employer.  °  How to apply: °Eligibility screenings are held every Tuesday and Wednesday afternoon from 1:00 pm until 4:00 pm. You do not need an appointment for the interview!  °[facility] [address], [place], [place] 336-631-2330   °[facility]  336-342-8273   °[facility]  336-703-3100   °[facility]  336-570-6415   ° °Behavioral Health Resources in the Community: °Intensive Outpatient Programs °Organization         Address  Phone  Notes  °[facility] [address], [place], [place] 336-878-6098   °[facility] [address], [place], [place] 336-832-9800   °[facility] [address], [place], [place] ° 336-882-2125   °[facility] [address],  °[place], [place] 1-800-853-5163 or 336-641-4981   °Substance Abuse Resources °Organization         Address  Phone  Notes  °[facility]  336-882-2125   °[facility]  336-784-9470   °[facility]  336-285-9073   °[facility]  336-845-3988   °Residential & Outpatient Substance Abuse Program  1-800-659-3381   °Psychological Services °Organization         Address  Phone  Notes  °[facility]  336- 832-9600   °[facility]  336- 378-7881   °[facility] [address], [place] 1-800-853-5163 or 336-641-4981   ° °Mobile Crisis Teams °Organization         Address  Phone  Notes  °[facility], Mobile Crisis Care Unit  1-877-626-1772   °Assertive °[facility] ° [address]  [place], [place] 336-834-9664   °Sharon DeEsch [address] °[place] 336-554-5454   ° °Self-Help/Support Groups °Organization         Address  Phone             Notes  °[facility]  - variety of support groups  336- 373-1402 Call for more information  °Narcotics Anonymous (NA), [facility] [address], °[place]  2 meetings at this location  ° °  Residential Treatment Programs Organization         Address  Phone  Notes  [facility] [address],    [place]  3-212-248-2500   [facility]  [address] [place] [address] [place] [address], [place], [place] 891-694-5038   [facility] [address] [place], [place] 882-800-3491 Admissions: 8am-3pm M-F  [facility] [address],    [place], [place] 791-505-6979   [facility] [address] [place], [place], [place] 480-165-5374   [facility] [address]  [place], [place] 827-078-6754   [facility] - Intensive Outpatient [address], [place], [place] 492-010-0712   [facility] ([facility] [address] [place] [address]  [place], [place] 1-975-883-2549 or 931-068-2928   [facility] [address], [place], [place] 407-680-8811 Accepts Medicaid  [facility] [address],  [place] 0-315-945-8592 Substance Abuse/Addiction Treatment   [facility]         Address  Phone  Notes  [facility]  873-288-1551   Angie Fava, PhD [address] [place], [place]   857-076-4325 or 920 140 9594   [facility]   [address] [place], [place] 212-019-4330   [facility] [address] [place], [place] 419-662-6913 Insurance/Medicaid/sponsorship through [facility] and Families [address]                                    [place], [place] ([phone])[phone] Therapy/tele-psych/case    [facility] [address], [place] [phone]    Dr. Lolly Mustache  [phone]   [facility]  [employer] [facility] Dept. 1) [address] [place] 2) [address], [place] 3)  [address], [place] 321-175-2132 [phone]) [phone]  630-807-4784   [facility] Child Abuse Hotline (431)478-3731 or (567) 194-7445 (After Hours)      Take the prescription as directed.  Call your regular medical or OB/GYN doctor tomorrow to schedule a follow up appointment within the next week.  Return to the Emergency Department immediately sooner if worsening.

## 2014-03-07 NOTE — ED Notes (Signed)
Pt states lower abdominal pain, dysuria, lower back pain. States she has been treating herself for a yeast infection x 3 days and these symptoms began yesterday.

## 2014-03-09 LAB — GC/CHLAMYDIA PROBE AMP (~~LOC~~) NOT AT ARMC
Chlamydia: NEGATIVE
Neisseria Gonorrhea: NEGATIVE

## 2014-05-05 ENCOUNTER — Encounter (HOSPITAL_COMMUNITY): Payer: Self-pay | Admitting: Cardiology

## 2014-05-05 ENCOUNTER — Emergency Department (HOSPITAL_COMMUNITY)
Admission: EM | Admit: 2014-05-05 | Discharge: 2014-05-05 | Disposition: A | Discharge status: Home / Self Care | Payer: Medicaid Other | Attending: Emergency Medicine | Admitting: Emergency Medicine

## 2014-05-05 ENCOUNTER — Emergency Department (HOSPITAL_COMMUNITY): Payer: Medicaid Other

## 2014-05-05 DIAGNOSIS — Y9289 Other specified places as the place of occurrence of the external cause: Secondary | ICD-10-CM | POA: Insufficient documentation

## 2014-05-05 DIAGNOSIS — F909 Attention-deficit hyperactivity disorder, unspecified type: Secondary | ICD-10-CM | POA: Insufficient documentation

## 2014-05-05 DIAGNOSIS — Z87440 Personal history of urinary (tract) infections: Secondary | ICD-10-CM | POA: Diagnosis not present

## 2014-05-05 DIAGNOSIS — Z720 Tobacco use: Secondary | ICD-10-CM | POA: Diagnosis not present

## 2014-05-05 DIAGNOSIS — Y9389 Activity, other specified: Secondary | ICD-10-CM | POA: Insufficient documentation

## 2014-05-05 DIAGNOSIS — W2209XA Striking against other stationary object, initial encounter: Secondary | ICD-10-CM | POA: Diagnosis not present

## 2014-05-05 DIAGNOSIS — Y998 Other external cause status: Secondary | ICD-10-CM | POA: Diagnosis not present

## 2014-05-05 DIAGNOSIS — Z8701 Personal history of pneumonia (recurrent): Secondary | ICD-10-CM | POA: Insufficient documentation

## 2014-05-05 DIAGNOSIS — Z79899 Other long term (current) drug therapy: Secondary | ICD-10-CM | POA: Insufficient documentation

## 2014-05-05 DIAGNOSIS — S5001XA Contusion of right elbow, initial encounter: Secondary | ICD-10-CM | POA: Diagnosis not present

## 2014-05-05 DIAGNOSIS — S59902A Unspecified injury of left elbow, initial encounter: Secondary | ICD-10-CM | POA: Diagnosis present

## 2014-05-05 MED ORDER — TRAMADOL HCL 50 MG PO TABS
50.0000 mg | ORAL_TABLET | Freq: Once | ORAL | Status: AC
  Administered 2014-05-05: 50 mg via ORAL
  Filled 2014-05-05: qty 1

## 2014-05-05 MED ORDER — HYDROCODONE-ACETAMINOPHEN 5-325 MG PO TABS: 1.0000 | ORAL_TABLET | ORAL | Status: DC | PRN

## 2014-05-05 MED ORDER — PREDNISONE 10 MG PO TABS: ORAL_TABLET | ORAL | Status: DC

## 2014-05-05 NOTE — ED Provider Notes (Signed)
CSN: [id number]     Arrival date & time 05/05/14  1701 History   First MD Initiated Contact with Patient 05/05/14 1719     Chief Complaint  Patient presents with  . Arm Injury     (Consider location/radiation/quality/duration/timing/severity/associated sxs/prior Treatment) Patient is a 39 y.o. female presenting with arm injury. The history is provided by the patient.  Arm Injury Location:  Arm and elbow Time since incident:  2 days Elbow location:  R elbow Pain details:    Quality:  Aching and throbbing   Radiates to:  R shoulder and R forearm   Severity:  Moderate   Onset quality:  Sudden   Timing:  Constant   Progression:  Worsening Chronicity:  New Handedness:  Right-handed Dislocation: no   Prior injury to area:  No Relieved by:  Nothing Worsened by:  Movement Ineffective treatments:  NSAIDs and ice Associated symptoms: tingling   Associated symptoms: no fever and no numbness   Associated symptoms comment:  She has had increased swelling at the elbow today along with new tingling into her hand.   Past Medical History  Diagnosis Date  . ADD (attention deficit disorder)   . PTSD (post-traumatic stress disorder)   . Pneumonia   . UTI (lower urinary tract infection)    Past Surgical History  Procedure Laterality Date  . Abdominal hysterectomy     History reviewed. No pertinent family history. History  Substance Use Topics  . Smoking status: Current Every Day Smoker -- 1.00 packs/day for 10 years    Types: Cigarettes  . Smokeless tobacco: Never Used  . Alcohol Use: No   OB History    Gravida Para Term Preterm AB TAB SAB Ectopic Multiple Living   8 1 1  7  7   1      Review of Systems  Constitutional: Negative for fever.  Musculoskeletal: Positive for joint swelling and arthralgias. Negative for myalgias.  Skin: Positive for color change.  Neurological: Negative for weakness and numbness.      Allergies  Morphine and related  Home Medications    Prior to Admission medications   Medication Sig Start Date End Date Taking? Authorizing Provider  ALPRAZolam ) 1 MG tablet Take 1 mg by mouth 3 (three) times daily.   Yes Historical Provider, MD  amphetamine-dextroamphetamine (ADDERALL) 20 MG tablet Take 10 mg by mouth every morning.    Yes Historical Provider, MD  Aspirin-Acetaminophen-Caffeine (GOODY HEADACHE PO) Take 1 packet by mouth 2 (two) times daily as needed (FOR PAIN).   Yes Historical Provider, MD  ibuprofen (ADVIL,MOTRIN) 200 MG tablet Take 600 mg by mouth every 6 (six) hours as needed for mild pain or moderate pain.   Yes Historical Provider, MD  Multiple Vitamin (MULTIVITAMIN WITH MINERALS) TABS tablet Take 1 tablet by mouth every evening.   Yes Historical Provider, MD  HYDROcodone-acetaminophen (NORCO/VICODIN) 5-325 MG per tablet Take 1 tablet by mouth every 4 (four) hours as needed. 05/05/14   05/07/14, PA-C  metroNIDAZOLE (FLAGYL) 500 MG tablet Take 1 tablet (500 mg total) by mouth 2 (two) times daily. Patient not taking: Reported on 05/05/2014 03/07/14   03/09/14, DO  ondansetron (ZOFRAN ODT) 8 MG disintegrating tablet Take 1 tablet (8 mg total) by mouth every 8 (eight) hours as needed for nausea or vomiting. Patient not taking: Reported on 03/07/2014 02/27/14   04/28/14, MD  ondansetron (ZOFRAN) 4 MG tablet Take 1 tablet (4 mg total) by mouth every 8 (eight) hours  as needed. Patient not taking: Reported on 02/27/2014 10/18/13   Devoria Albe, MD  predniSONE (DELTASONE) 10 MG tablet 6, 5, 4, 3, 2 then 1 tablet by mouth daily for 6 days total. 05/05/14   Burgess Amor, PA-C   BP 115/71 mmHg  Pulse 80  Temp(Src) 97.8 F (36.6 C) (Oral)  Resp 15  Ht 5\' 4"  (1.626 m)  Wt 101 lb (45.813 kg)  BMI 17.33 kg/m2  SpO2 99%  LMP 03/09/2011 Physical Exam  Constitutional: She appears well-developed and well-nourished.  HENT:  Head: Atraumatic.  Neck: Normal range of motion.  Cardiovascular:  Pulses equal bilaterally   Musculoskeletal: She exhibits edema and tenderness.       Right elbow: She exhibits swelling. Tenderness found. Olecranon process tenderness noted.  TTP at the olecranon process and posterior distal humerus with a moderate hematoma and bruising noted.  Radial pulses intact.  She has tingling sensation to touch in her ulner distribution of fingertips.  Less than 2 second distal cap refill.    Neurological: She is alert. She has normal strength. She displays normal reflexes. No sensory deficit.  Skin: Skin is warm and dry.  Psychiatric: She has a normal mood and affect.    ED Course  Procedures (including critical care time) Labs Review Labs Reviewed - No data to display  Imaging Review Dg Elbow Complete Right  05/05/2014   CLINICAL DATA:  Right elbow pain after injury. Hit right arm on swinging door 3 days prior, now with fingers tingling.  EXAM: RIGHT ELBOW - COMPLETE 3+ VIEW  COMPARISON:  Right elbow 04/17/2014  FINDINGS: No fracture or dislocation. The alignment and joint spaces are maintained. Bone mineralization is normal. No osteophytes, erosion, periosteal reaction or bony destructive change. No joint effusion. Minimal soft tissue edema posteriorly.  IMPRESSION: Minimal posterior soft tissue edema.  No bony abnormality.   Electronically Signed   By: 04/19/2014 M.D.   On: 05/05/2014 18:16     EKG Interpretation None      MDM   Final diagnoses:  Elbow contusion, right, initial encounter    Patients labs and/or radiological studies were reviewed and considered during the medical decision making and disposition process.  Results were also discussed with patient. Patient was encouraged to continue using ice for the next 2-3 days to help reduce swelling.  May add heat therapy on day 3.  She was prescribed a prednisone taper, short course of hydrocodone.  She was a referral to orthopedics for recheck of this injury if her symptoms are not improved over the next 10 days.   Reassurance given that I suspect that her symptoms will completely resolve with time and as this swelling improves.    05/07/2014, PA-C 05/05/14 05/07/14, MD 05/10/14 1739

## 2014-05-05 NOTE — ED Notes (Signed)
PA at bedside.

## 2014-05-05 NOTE — ED Notes (Signed)
Hit right arm on swinging door Thursday.  Today fingers in same arm are tingling.  Has increase of pain in her right arm.

## 2014-05-05 NOTE — Discharge Instructions (Signed)
Contusion °A contusion is a deep bruise. Contusions are the result of an injury that caused bleeding under the skin. The contusion may turn blue, purple, or yellow. Minor injuries will give you a painless contusion, but more severe contusions may stay painful and swollen for a few weeks.  °CAUSES  °A contusion is usually caused by a blow, trauma, or direct force to an area of the body. °SYMPTOMS  °· Swelling and redness of the injured area. °· Bruising of the injured area. °· Tenderness and soreness of the injured area. °· Pain. °DIAGNOSIS  °The diagnosis can be made by taking a history and physical exam. An X-ray, CT scan, or MRI may be needed to determine if there were any associated injuries, such as fractures. °TREATMENT  °Specific treatment will depend on what area of the body was injured. In general, the best treatment for a contusion is resting, icing, elevating, and applying cold compresses to the injured area. Over-the-counter medicines may also be recommended for pain control. Ask your caregiver what the best treatment is for your contusion. °HOME CARE INSTRUCTIONS  °· Put ice on the injured area. °¨ Put ice in a plastic bag. °¨ Place a towel between your skin and the bag. °¨ Leave the ice on for 15-20 minutes, 3-4 times a day, or as directed by your health care provider. °· Only take over-the-counter or prescription medicines for pain, discomfort, or fever as directed by your caregiver. Your caregiver may recommend avoiding anti-inflammatory medicines (aspirin, ibuprofen, and naproxen) for 48 hours because these medicines may increase bruising. °· Rest the injured area. °· If possible, elevate the injured area to reduce swelling. °SEEK IMMEDIATE MEDICAL CARE IF:  °· You have increased bruising or swelling. °· You have pain that is getting worse. °· Your swelling or pain is not relieved with medicines. °MAKE SURE YOU:  °· Understand these instructions. °· Will watch your condition. °· Will get help right  away if you are not doing well or get worse. °Document Released: 10/15/2004 Document Revised: 01/10/2013 Document Reviewed: 11/10/2010 °ExitCare® Patient Information ©2015 [employer]. This information is not intended to replace advice given to you by your health care provider. Make sure you discuss any questions you have with your health care provider. ° °

## 2014-07-17 ENCOUNTER — Encounter (HOSPITAL_COMMUNITY): Payer: Self-pay | Admitting: Emergency Medicine

## 2014-07-17 ENCOUNTER — Emergency Department (HOSPITAL_COMMUNITY)
Admission: EM | Admit: 2014-07-17 | Discharge: 2014-07-17 | Disposition: A | Discharge status: Home / Self Care | Payer: Medicaid Other | Attending: Emergency Medicine | Admitting: Emergency Medicine

## 2014-07-17 ENCOUNTER — Emergency Department (HOSPITAL_COMMUNITY): Payer: Medicaid Other

## 2014-07-17 DIAGNOSIS — M545 Low back pain: Secondary | ICD-10-CM | POA: Insufficient documentation

## 2014-07-17 DIAGNOSIS — F909 Attention-deficit hyperactivity disorder, unspecified type: Secondary | ICD-10-CM | POA: Insufficient documentation

## 2014-07-17 DIAGNOSIS — Z90710 Acquired absence of both cervix and uterus: Secondary | ICD-10-CM | POA: Diagnosis not present

## 2014-07-17 DIAGNOSIS — G8929 Other chronic pain: Secondary | ICD-10-CM | POA: Diagnosis not present

## 2014-07-17 DIAGNOSIS — R1031 Right lower quadrant pain: Secondary | ICD-10-CM | POA: Diagnosis present

## 2014-07-17 DIAGNOSIS — F4310 Post-traumatic stress disorder, unspecified: Secondary | ICD-10-CM | POA: Diagnosis not present

## 2014-07-17 DIAGNOSIS — R102 Pelvic and perineal pain: Secondary | ICD-10-CM | POA: Insufficient documentation

## 2014-07-17 DIAGNOSIS — Z79899 Other long term (current) drug therapy: Secondary | ICD-10-CM | POA: Diagnosis not present

## 2014-07-17 DIAGNOSIS — Z720 Tobacco use: Secondary | ICD-10-CM | POA: Insufficient documentation

## 2014-07-17 DIAGNOSIS — R112 Nausea with vomiting, unspecified: Secondary | ICD-10-CM | POA: Diagnosis not present

## 2014-07-17 DIAGNOSIS — R197 Diarrhea, unspecified: Secondary | ICD-10-CM | POA: Insufficient documentation

## 2014-07-17 DIAGNOSIS — R109 Unspecified abdominal pain: Secondary | ICD-10-CM

## 2014-07-17 DIAGNOSIS — Z87440 Personal history of urinary (tract) infections: Secondary | ICD-10-CM | POA: Insufficient documentation

## 2014-07-17 DIAGNOSIS — Z8701 Personal history of pneumonia (recurrent): Secondary | ICD-10-CM | POA: Insufficient documentation

## 2014-07-17 HISTORY — DX: Other chronic pain: G89.29

## 2014-07-17 HISTORY — DX: Dorsalgia, unspecified: M54.9

## 2014-07-17 HISTORY — DX: Cervicalgia: M54.2

## 2014-07-17 HISTORY — DX: Radiculopathy, site unspecified: M54.10

## 2014-07-17 LAB — URINALYSIS, ROUTINE W REFLEX MICROSCOPIC
Bilirubin Urine: NEGATIVE
GLUCOSE, UA: NEGATIVE mg/dL
Hgb urine dipstick: NEGATIVE
Ketones, ur: NEGATIVE mg/dL
LEUKOCYTES UA: NEGATIVE
NITRITE: NEGATIVE
Protein, ur: NEGATIVE mg/dL
Specific Gravity, Urine: <1.005 — ABNORMAL LOW (ref 1.005–1.030)
UROBILINOGEN UA: 0.2 mg/dL (ref 0.0–1.0)
pH: 6 (ref 5.0–8.0)

## 2014-07-17 LAB — CBC WITH DIFFERENTIAL/PLATELET
Basophils Absolute: 0 10*3/uL (ref 0.0–0.1)
Basophils Relative: 0 % (ref 0–1)
EOS ABS: 0 10*3/uL (ref 0.0–0.7)
Eosinophils Relative: 1 % (ref 0–5)
HCT: 39.1 % (ref 36.0–46.0)
HEMOGLOBIN: 13.2 g/dL (ref 12.0–15.0)
Lymphocytes Relative: 25 % (ref 12–46)
Lymphs Abs: 1.8 10*3/uL (ref 0.7–4.0)
MCH: 31.3 pg (ref 26.0–34.0)
MCHC: 33.8 g/dL (ref 30.0–36.0)
MCV: 92.7 fL (ref 78.0–100.0)
MONOS PCT: 3 % (ref 3–12)
Monocytes Absolute: 0.2 10*3/uL (ref 0.1–1.0)
NEUTROS PCT: 71 % (ref 43–77)
Neutro Abs: 5.1 10*3/uL (ref 1.7–7.7)
Platelets: 279 10*3/uL (ref 150–400)
RBC: 4.22 MIL/uL (ref 3.87–5.11)
RDW: 12.9 % (ref 11.5–15.5)
WBC: 7.2 10*3/uL (ref 4.0–10.5)

## 2014-07-17 LAB — LIPASE, BLOOD: LIPASE: 30 U/L (ref 22–51)

## 2014-07-17 LAB — COMPREHENSIVE METABOLIC PANEL
ALK PHOS: 63 U/L (ref 38–126)
ALT: 8 U/L — ABNORMAL LOW (ref 14–54)
AST: 17 U/L (ref 15–41)
Albumin: 4.2 g/dL (ref 3.5–5.0)
Anion gap: 7 (ref 5–15)
BUN: 7 mg/dL (ref 6–20)
CALCIUM: 9.1 mg/dL (ref 8.9–10.3)
CO2: 25 mmol/L (ref 22–32)
CREATININE: 0.61 mg/dL (ref 0.44–1.00)
Chloride: 107 mmol/L (ref 101–111)
GLUCOSE: 83 mg/dL (ref 65–99)
Potassium: 3.8 mmol/L (ref 3.5–5.1)
Sodium: 139 mmol/L (ref 135–145)
Total Bilirubin: 0.2 mg/dL — ABNORMAL LOW (ref 0.3–1.2)
Total Protein: 7.7 g/dL (ref 6.5–8.1)

## 2014-07-17 LAB — WET PREP, GENITAL
Clue Cells Wet Prep HPF POC: NONE SEEN
Trich, Wet Prep: NONE SEEN
Yeast Wet Prep HPF POC: NONE SEEN

## 2014-07-17 MED ORDER — NAPROXEN 250 MG PO TABS: 250.0000 mg | ORAL_TABLET | Freq: Two times a day (BID) | ORAL | Status: DC | PRN

## 2014-07-17 MED ORDER — KETOROLAC TROMETHAMINE 30 MG/ML IJ SOLN
30.0000 mg | Freq: Once | INTRAMUSCULAR | Status: AC
  Administered 2014-07-17: 30 mg via INTRAVENOUS
  Filled 2014-07-17: qty 1

## 2014-07-17 MED ORDER — ONDANSETRON HCL 4 MG PO TABS
4.0000 mg | ORAL_TABLET | Freq: Three times a day (TID) | ORAL | Status: DC | PRN
Start: 2014-07-17 — End: 2015-03-05

## 2014-07-17 MED ORDER — FENTANYL CITRATE (PF) 100 MCG/2ML IJ SOLN
50.0000 ug | INTRAMUSCULAR | Status: AC | PRN
  Administered 2014-07-17 (×2): 50 ug via INTRAVENOUS
  Filled 2014-07-17 (×2): qty 2

## 2014-07-17 MED ORDER — METHOCARBAMOL 500 MG PO TABS: 1000.0000 mg | ORAL_TABLET | Freq: Four times a day (QID) | ORAL | Status: DC | PRN

## 2014-07-17 MED ORDER — ONDANSETRON HCL 4 MG/2ML IJ SOLN
4.0000 mg | INTRAMUSCULAR | Status: DC | PRN
  Administered 2014-07-17: 4 mg via INTRAVENOUS
  Filled 2014-07-17: qty 2

## 2014-07-17 NOTE — ED Notes (Signed)
Pt sent by [facility] for RLQ pain with nausea, onset Sun. Pt also reports diarrhea.

## 2014-07-17 NOTE — Discharge Instructions (Signed)
°Emergency Department Resource Guide °1) Find a Doctor and Pay Out of Pocket °Although you won't have to find out who is covered by your insurance plan, it is a good idea to ask around and get recommendations. You will then need to call the office and see if the doctor you have chosen will accept you as a new patient and what types of options they offer for patients who are self-pay. Some doctors offer discounts or will set up payment plans for their patients who do not have insurance, but you will need to ask so you aren't surprised when you get to your appointment. ° °2) Contact Your Local Health Department °Not all health departments have doctors that can see patients for sick visits, but many do, so it is worth a call to see if yours does. If you don't know where your local health department is, you can check in your phone book. The CDC also has a tool to help you locate your state's health department, and many state websites also have listings of all of their local health departments. ° °3) Find a Walk-in Clinic °If your illness is not likely to be very severe or complicated, you may want to try a walk in clinic. These are popping up all over the country in pharmacies, drugstores, and shopping centers. They're usually staffed by nurse practitioners or physician assistants that have been trained to treat common illnesses and complaints. They're usually fairly quick and inexpensive. However, if you have serious medical issues or chronic medical problems, these are probably not your best option. ° °No Primary Care Doctor: °- Call [facility] at  832-8000 - they can help you locate a primary care doctor that  accepts your insurance, provides certain services, etc. °- Physician Referral Service- 1-800-533-3463 ° °Chronic Pain Problems: °Organization         Address  Phone   Notes  °[facility]  ([phone]) [phone] Patients need to be referred by their primary care doctor.  ° °Medication  Assistance: °Organization         Address  Phone   Notes  °[place] [facility] [address] °[place], [place] [address] (336) 641-8030 --Must be a resident of [place] °-- Must have NO insurance coverage whatsoever (no Medicaid/ Medicare, etc.) °-- The pt. MUST have a primary care doctor that directs their care regularly and follows them in the community °  °[facility]  (866) 331-1348   °[employer]  (888) 892-1162   ° °Agencies that provide inexpensive medical care: °Organization         Address  Phone   Notes  °[facility]  (336) 832-8035   °[facility]    (336) 832-7272   °[facility] [address] °[place], [place] [address] (336) 832-4777   °[facility] [address], °[place] (336) 271-4999   °[facility]    (336) 373-0678   °[facility]    (336) 272-1050   °[facility] ° [address], [place] Phone:  (336) 832-4444, Fax:  (336) 832-4440 Hours of Operation:  9 am - 6 pm, M-F.  Also accepts Medicaid/Medicare and self-pay.  °[facility] ° [address], [place] Phone: (336) [phone], Fax: (336) [phone]. Hours of Operation:  8:30 am - 5:30 pm, M-F.  Also accepts Medicaid and self-pay.  °[facility] [place] [address], [place] Phone: (336) 878-6027   °[facility] [address], [place], [place] (336)723-1848, Ext. 123 Mondays & Thursdays: 7-9 AM.  First 15 patients are seen on a first come, first serve basis. °  ° °Medicaid-accepting [place] Providers: ° °Organization         Address  Phone   Notes  °[facility] [address], [place] (336) [phone] Also accepts self-pay patients.  °[facility] [address], [place] ° (336) 856-9996   °[facility] [address], [place]  (336) 288-8857   °[facility] [address] [place] [address], [place] (336) 299-7000   °Veita Bland [address], [place]  ° (336) 373-1557 Only accepts [place] Access Medicaid patients after they have their name applied to their card.  ° °Self-Pay (no insurance) in [place]: ° °Organization         Address  Phone   Notes  °Sickle Cell Patients, [facility] [address], [place] (336) 832-1970   °[facility] [address], [place] (336) 832-4400   °[facility] ° [address], [place] (336) 992-4800   °[facility]/Dr. Osei-Bonsu ° [address] [place] [address], [place] or [address], [place] (336) [phone] Phone number for both [place] and [place] locations is the same.  °[facility] [address], [place] (336) 299-0000   °[facility] [address] [place] [address], [place] or [address] (336) 852-7530 °(336) 878-2260   °[facility] [address], [place] (336) 350-1642, phone; (336) 294-5005, fax Sees patients 1st and 3rd Saturday of every month.  Must not qualify for public or private insurance (i.e. Medicaid, Medicare, [place] Health Choice, Veterans' Benefits) • Household income should be no more than 200% of the poverty level •The clinic cannot treat you if you are pregnant or think you are pregnant • Sexually transmitted diseases are not treated at the clinic.  ° ° °Dental Care: °Organization         Address  Phone  Notes  °[place] [facility] [address], [place] (336) 641-6152 Accepts children up to age 21 who are enrolled in Medicaid or [place] Health Choice; pregnant women with a Medicaid card; and children who have applied for Medicaid or [place] Health Choice, but were declined, whose parents can pay a reduced fee at time of service.  °[place]  [facility] [place]  [address], [place] (336) 641-7733 Accepts children up to age 21 who are enrolled in Medicaid or [place] Health Choice; pregnant women with a Medicaid card; and children who have applied for Medicaid or [place] Health Choice, but were declined, whose parents can pay a reduced fee at time of service.  °[facility] ° [address], [place] (336) 641-4533 Patients are seen by appointment only. Walk-ins are not accepted. [facility] will see patients 18 years of age and older. °Monday - Tuesday (8am-5pm) °Most Wednesdays (8:30-5pm) °$30 per visit, cash only  °[facility] ° [address], [place] (336) 641-4533 Patients are seen by appointment only. Walk-ins are not accepted. [facility] will see patients 18 years of age and older. °One   Wednesday Evening (Monthly: Volunteer Based).  $30 per visit, cash only  °[facility]  (919) [phone] for adults; Children under age 4, call Graduate Pediatric Dentistry at ([phone]) [phone]. Children aged 4-14, please call ([phone]) [phone] to request a pediatric application. ° Dental services are provided in all areas of dental care including fillings, crowns and bridges, complete and partial dentures, implants, gum treatment, root canals, and extractions. Preventive care is also provided. Treatment is provided to both adults and children. °Patients are selected via a lottery and there is often a waiting list. °  °[facility] [address], °[place] ° (336) 763-8833 [email or web address] °  °[facility] [address], [place], [place] (336)723-1848, Ext. 123 Second and Fourth Thursday of each month, opens at 6:30 AM; Clinic ends at 9 AM.  Patients are seen on a first-come first-served basis, and a limited number are seen during each clinic.  ° °[facility] ° [address], [place], [place] (336) [phone]    Eligibility Requirements °You must have lived in [place], [place], or [place] counties for at least the last three months. °  You cannot be eligible for state or federal sponsored healthcare insurance, including Veterans Administration, Medicaid, or Medicare. °  You generally cannot be eligible for healthcare insurance through your employer.  °  How to apply: °Eligibility screenings are held every Tuesday and Wednesday afternoon from 1:00 pm until 4:00 pm. You do not need an appointment for the interview!  °[facility] [address], [place], [place] 336-631-2330   °[facility]  336-342-8273   °[facility]  336-703-3100   °[facility]  336-570-6415   ° °Behavioral Health Resources in the Community: °Intensive Outpatient Programs °Organization         Address  Phone  Notes  °[facility] [address], [place], [place] 336-878-6098   °[facility] [address], [place], [place] 336-832-9800   °[facility] [address], [place], [place] ° 336-882-2125   °[facility] [address],  °[place], [place] 1-800-853-5163 or 336-641-4981   °Substance Abuse Resources °Organization         Address  Phone  Notes  °[facility]  336-882-2125   °[facility]  336-784-9470   °[facility]  336-285-9073   °[facility]  336-845-3988   °Residential & Outpatient Substance Abuse Program  1-800-659-3381   °Psychological Services °Organization         Address  Phone  Notes  °[facility]  336- 832-9600   °[facility]  336- 378-7881   °[facility] [address], [place] 1-800-853-5163 or 336-641-4981   ° °Mobile Crisis Teams °Organization         Address  Phone  Notes  °[facility], Mobile Crisis Care Unit  1-877-626-1772   °Assertive °[facility] ° [address]  [place], [place] 336-834-9664   °Sharon DeEsch [address] °[place] 336-554-5454   ° °Self-Help/Support Groups °Organization         Address  Phone             Notes  °[facility]  - variety of support groups  336- 373-1402 Call for more information  °Narcotics Anonymous (NA), [facility] [address], °[place]  2 meetings at this location  ° °  Residential Treatment Programs Organization         Address  Phone  Notes  [facility] [address]    [place]  0-459-977-4142   [facility]  [address] [place] [address], [place], [place] 233-435-6861   [facility] [address] [place], [place] 683-729-0211 Admissions: 8am-3pm M-F  [facility] [address]    [place], [place] 155-208-0223   [facility] [address] [place], [place], [place] [phone]   [facility] [address]  [place], [place] 300-511-0211   [facility] Intensive Outpatient [address], [place], [place] 173-567-0141   [facility] ([facility] [address]  [place], [place] 0-301-314-3888 or 431-033-1566   [facility] [address] [place], [place] [phone] Accepts Medicaid  [facility] [address]  [place] 3-276-147-0929 Substance Abuse/Addiction Treatment   [facility]         Address  Phone  Notes  [facility]  717-123-6010   Angie Fava, PhD [address] [place] [place], [place]   [phone] or 416-846-4840   [facility]   [address] [place], [place] 601-837-4227   [facility] [address], [place], [place] [phone] Insurance/Medicaid/sponsorship through [facility] [address]                                    [place], [place] ([phone] Therapy/tele-psych/case    [facility] [address], [place] [phone]    Dr. Lolly Mustache  [phone]   [facility]  [employer] [facility] Dept. 1) [address] [place] 2) [address], [place] 3)  [address], [place] [phone] [phone]  [phone]   [facility] Child Abuse Hotline (541) 369-8070 or 8068882576 (After Hours)      Take the prescriptions as directed.  Apply moist heat to the area(s) of discomfort, for 15 minutes at a time, several times per day for the next few days.  Do not fall asleep on a heating pack.  Call your regular medical and OB/GYN doctors tomorrow to schedule a follow up appointment this week.  Return to the Emergency Department immediately if worsening.

## 2014-07-17 NOTE — ED Provider Notes (Signed)
CSN: [id number]     Arrival date & time 07/17/14  1641 History   First MD Initiated Contact with Patient 07/17/14 1714     Chief Complaint  Patient presents with  . Abdominal Pain  . Flank Pain     HPI Pt was seen at 1735.  Per pt, c/o gradual onset and persistence of constant RLQ abd "pain" for the past 3 days.  Has been associated with multiple intermittent episodes of N/V/D.  Describes the abd pain as "cramping" with radiation into her right lower back. Denies vaginal discharge/bleeding, no fevers, no rash, no CP/SOB, no black or blood in stools or emesis.       Past Medical History  Diagnosis Date  . ADD (attention deficit disorder)   . PTSD (post-traumatic stress disorder)   . Pneumonia   . UTI (lower urinary tract infection)   . Chronic neck and back pain   . Radiculopathy    Past Surgical History  Procedure Laterality Date  . Abdominal hysterectomy     Family History  Problem Relation Age of Onset  . Hypertension Mother   . Hypertension Father   . Cancer Father    History  Substance Use Topics  . Smoking status: Current Every Day Smoker -- 1.00 packs/day for 10 years    Types: Cigarettes  . Smokeless tobacco: Never Used  . Alcohol Use: No   OB History    Gravida Para Term Preterm AB TAB SAB Ectopic Multiple Living   Review of Systems ROS: Statement: All systems negative except as marked or noted in the HPI; Constitutional: Negative for fever and chills. ; ; Eyes: Negative for eye pain, redness and discharge. ; ; ENMT: Negative for ear pain, hoarseness, nasal congestion, sinus pressure and sore throat. ; ; Cardiovascular: Negative for chest pain, palpitations, diaphoresis, dyspnea and peripheral edema. ; ; Respiratory: Negative for cough, wheezing and stridor. ; ; Gastrointestinal: +N/V/D, abd pain. Negative for blood in stool, hematemesis, jaundice and rectal bleeding. . ; ; Genitourinary: Negative for dysuria, flank pain and hematuria. ; ;  Musculoskeletal: +LBP. Negative for neck pain. Negative for swelling and trauma.; ; Skin: Negative for pruritus, rash, abrasions, blisters, bruising and skin lesion.; ; Neuro: Negative for headache, lightheadedness and neck stiffness. Negative for weakness, altered level of consciousness , altered mental status, extremity weakness, paresthesias, involuntary movement, seizure and syncope.        Allergies  Morphine and related  Home Medications   Prior to Admission medications   Medication Sig Start Date End Date Taking? Authorizing Provider  ALPRAZolam Prudy Feeler) 1 MG tablet Take 1 mg by mouth 3 (three) times daily.   Yes Historical Provider, MD  amphetamine-dextroamphetamine (ADDERALL) 20 MG tablet Take 10 mg by mouth every morning.    Yes Historical Provider, MD  FLUoxetine (PROZAC) 20 MG capsule Take 20 mg by mouth daily.   Yes Historical Provider, MD  Multiple Vitamin (MULTIVITAMIN WITH MINERALS) TABS tablet Take 1 tablet by mouth every evening.   Yes Historical Provider, MD  Aspirin-Acetaminophen-Caffeine (GOODY HEADACHE PO) Take 1 packet by mouth 2 (two) times daily as needed (FOR PAIN).    Historical Provider, MD  HYDROcodone-acetaminophen (NORCO/VICODIN) 5-325 MG per tablet Take 1 tablet by mouth every 4 (four) hours as needed. Patient not taking: Reported on 07/17/2014 05/05/14   Burgess Amor, PA-C  ibuprofen (ADVIL,MOTRIN) 200 MG tablet Take 600 mg by mouth every 6 (  six) hours as needed for mild pain or moderate pain.    Historical Provider, MD  metroNIDAZOLE (FLAGYL) 500 MG tablet Take 1 tablet (500 mg total) by mouth 2 (two) times daily. Patient not taking: Reported on 05/05/2014 03/07/14   Samuel Jester, DO  ondansetron (ZOFRAN ODT) 8 MG disintegrating tablet Take 1 tablet (8 mg total) by mouth every 8 (eight) hours as needed for nausea or vomiting. Patient not taking: Reported on 03/07/2014 02/27/14   Margarita Grizzle, MD  ondansetron (ZOFRAN) 4 MG tablet Take 1 tablet (4 mg total) by mouth  every 8 (eight) hours as needed. Patient not taking: Reported on 02/27/2014 10/18/13   Devoria Albe, MD  predniSONE (DELTASONE) 10 MG tablet 6, 5, 4, 3, 2 then 1 tablet by mouth daily for 6 days total. Patient not taking: Reported on 07/17/2014 05/05/14   Burgess Amor, PA-C   BP 98/67 mmHg  Pulse 63  Temp(Src) 98.1 F (36.7 C) (Oral)  Resp 20  Ht 5\' 4"  (1.626 m)  Wt 98 lb (44.453 kg)  BMI 16.81 kg/m2  SpO2 98%  LMP 03/09/2011 Physical Exam  1740: Physical examination:  Nursing notes reviewed; Vital signs and O2 SAT reviewed;  Constitutional: Well developed, Well nourished, Well hydrated, In no acute distress; Head:  Normocephalic, atraumatic; Eyes: EOMI, PERRL, No scleral icterus; ENMT: Mouth and pharynx normal, Mucous membranes moist; Neck: Supple, Full range of motion, No lymphadenopathy; Cardiovascular: Regular rate and rhythm, No murmur, rub, or gallop; Respiratory: Breath sounds clear & equal bilaterally, No rales, rhonchi, wheezes.  Speaking full sentences with ease, Normal respiratory effort/excursion; Chest: Nontender, Movement normal; Abdomen: Soft, +RLQ tenderness to palp. No rebound or guarding. Nondistended, Normal bowel sounds; Genitourinary: No CVA tenderness. Pelvic exam performed with permission of pt and female ED tech assist during exam.  External genitalia w/o lesions. Vaginal vault with thick white discharge.  Cervix w/o lesions, not friable, GC/chlam and wet prep obtained and sent to lab.  Bimanual exam w/o CMT, uterine or left adnexal tenderness, +right pelvic tenderness.; Spine:  No midline CS, TS, LS tenderness. +TTP right lumbar paraspinal muscles. No rash.;; Extremities: Pulses normal, No tenderness, No edema, No calf edema or asymmetry.; Neuro: AA&Ox3, Major CN grossly intact.  Speech clear. No gross focal motor or sensory deficits in extremities.; Skin: Color normal, Warm, Dry.   ED Course  Procedures     EKG Interpretation None      MDM  MDM Reviewed: previous chart,  nursing note and vitals Reviewed previous: labs Interpretation: labs, ultrasound and CT scan      Results for orders placed or performed during the hospital encounter of 07/17/14  Wet prep, genital  Result Value Ref Range   Yeast Wet Prep HPF POC NONE SEEN NONE SEEN   Trich, Wet Prep NONE SEEN NONE SEEN   Clue Cells Wet Prep HPF POC NONE SEEN NONE SEEN   WBC, Wet Prep HPF POC FEW (A) NONE SEEN  Urinalysis, Routine w reflex microscopic (not at [facility])  Result Value Ref Range   Color, Urine YELLOW YELLOW   APPearance CLEAR CLEAR   Specific Gravity, Urine <1.005 (L) 1.005 - 1.030   pH 6.0 5.0 - 8.0   Glucose, UA NEGATIVE NEGATIVE mg/dL   Hgb urine dipstick NEGATIVE NEGATIVE   Bilirubin Urine NEGATIVE NEGATIVE   Ketones, ur NEGATIVE NEGATIVE mg/dL   Protein, ur NEGATIVE NEGATIVE mg/dL   Urobilinogen, UA 0.2 0.0 - 1.0 mg/dL   Nitrite NEGATIVE NEGATIVE   Leukocytes, UA  NEGATIVE NEGATIVE  Comprehensive metabolic panel  Result Value Ref Range   Sodium 139 135 - 145 mmol/L   Potassium 3.8 3.5 - 5.1 mmol/L   Chloride 107 101 - 111 mmol/L   CO2 25 22 - 32 mmol/L   Glucose, Bld 83 65 - 99 mg/dL   BUN 7 6 - 20 mg/dL   Creatinine, Ser 3.29 0.44 - 1.00 mg/dL   Calcium 9.1 8.9 - 92.4 mg/dL   Total Protein 7.7 6.5 - 8.1 g/dL   Albumin 4.2 3.5 - 5.0 g/dL   AST 17 15 - 41 U/L   ALT 8 (L) 14 - 54 U/L   Alkaline Phosphatase 63 38 - 126 U/L   Total Bilirubin 0.2 (L) 0.3 - 1.2 mg/dL   GFR calc non Af Amer >60 >60 mL/min   GFR calc Af Amer >60 >60 mL/min   Anion gap 7 5 - 15  CBC with Differential  Result Value Ref Range   WBC 7.2 4.0 - 10.5 K/uL   RBC 4.22 3.87 - 5.11 MIL/uL   Hemoglobin 13.2 12.0 - 15.0 g/dL   HCT 26.8 34.1 - 96.2 %   MCV 92.7 78.0 - 100.0 fL   MCH 31.3 26.0 - 34.0 pg   MCHC 33.8 30.0 - 36.0 g/dL   RDW 22.9 79.8 - 92.1 %   Platelets 279 150 - 400 K/uL   Neutrophils Relative % 71 43 - 77 %   Neutro Abs 5.1 1.7 - 7.7 K/uL   Lymphocytes Relative 25 12 - 46 %    Lymphs Abs 1.8 0.7 - 4.0 K/uL   Monocytes Relative 3 3 - 12 %   Monocytes Absolute 0.2 0.1 - 1.0 K/uL   Eosinophils Relative 1 0 - 5 %   Eosinophils Absolute 0.0 0.0 - 0.7 K/uL   Basophils Relative 0 0 - 1 %   Basophils Absolute 0.0 0.0 - 0.1 K/uL  Lipase, blood  Result Value Ref Range   Lipase 30 22 - 51 U/L    [place] Art/ven Flow Abd Pelv Doppler 07/17/2014   CLINICAL DATA:  Right lower quadrant pain with nausea, vomiting, and diarrhea.  EXAM: TRANSABDOMINAL AND TRANSVAGINAL ULTRASOUND OF PELVIS  DOPPLER ULTRASOUND OF OVARIES  TECHNIQUE: Both transabdominal and transvaginal ultrasound examinations of the pelvis were performed. Transabdominal technique was performed for global imaging of the pelvis including uterus, ovaries, adnexal regions, and pelvic cul-de-sac.  It was necessary to proceed with endovaginal exam following the transabdominal exam to visualize the ovaries and adnexa. Color and duplex Doppler ultrasound was utilized to evaluate blood flow to the ovaries.  COMPARISON:  None.  FINDINGS: Uterus  Removed.  Right ovary  Measurements: 2.6 x 1.6 x 2.6 cm. Normal appearance/no adnexal mass.  Left ovary  Measurements: 2.7 x 1.9 x 2.3 cm. Normal appearance/no adnexal mass.  Pulsed Doppler evaluation of both ovaries demonstrates normal low-resistance arterial and venous waveforms.  Other findings  No free fluid.  IMPRESSION: Normal ovaries with normal perfusion bilaterally. No visible mass or free fluid or other abnormality.   Electronically Signed   By: Francene Boyers M.D.   On: 07/17/2014 20:00   Ct Renal Stone Study 07/17/2014   CLINICAL DATA:  39 year old female with right lower quadrant pain, nausea, and diarrhea. History of partial hysterectomy.  EXAM: CT ABDOMEN AND PELVIS WITHOUT CONTRAST  TECHNIQUE: Multidetector CT imaging of the abdomen and pelvis was performed following the standard protocol without IV contrast.  COMPARISON:  CT dated 02/27/2014.  FINDINGS: Evaluation of this exam is  limited in the absence of intravenous contrast.  Lower chest: There are areas of nodularity in the right middle lobe grossly similar to prior study, sequela of inflammatory/ infiltrative process seen on CT dated 03/26/2013  Peritoneum: No free air or free fluid.  Liver: Unremarkable.No intrahepatic biliary ductal dilatation.  Gallbladder: Unremarkable.  Pancreas: Unremarkable.No ductal dilatation.  Spleen: Unremarkable.  Adrenals glands: Unremarkable.  Kidneys, ureters, urinary bladder: Unremarkable.  Reproductive: Unremarkable as visualized. Bilateral ovarian hypodensities likely represent normal ovarian tissue versus ovarian follicles. Ultrasound may provide better evaluation of the pelvic structures if clinically indicated.  Bowel and appendix: No evidence of bowel obstruction or inflammation.The appendix is unremarkable.  Vascular/Lymphatic: Unremarkable.No lymphadenopathy.  Abdominal wall/Musculoskeletal: There is stable sclerotic changes of the left sacral ala and along the left SI joint.  IMPRESSION: No hydronephrosis or nephrolithiasis.  No evidence of bowel obstruction or inflammation.  Normal appendix.   Electronically Signed   By: Elgie Collard M.D.   On: 07/17/2014 18:42    2110:  Workup reassuring. No N/V or stooling while in the ED. Tx symptomatically at this time. Pt feels better and wants to go home now. Dx and testing d/w pt and family.  Questions answered.  Verb understanding, agreeable to d/c home with outpt f/u.   Samuel Jester, DO 07/21/14 [phone]

## 2014-07-19 LAB — GC/CHLAMYDIA PROBE AMP (~~LOC~~) NOT AT ARMC
Chlamydia: NEGATIVE
Neisseria Gonorrhea: NEGATIVE

## 2014-08-02 ENCOUNTER — Encounter (HOSPITAL_COMMUNITY): Payer: Self-pay | Admitting: *Deleted

## 2014-08-02 ENCOUNTER — Emergency Department (HOSPITAL_COMMUNITY)
Admission: EM | Admit: 2014-08-02 | Discharge: 2014-08-02 | Disposition: A | Discharge status: Home / Self Care | Payer: Medicaid Other | Attending: Emergency Medicine | Admitting: Emergency Medicine

## 2014-08-02 ENCOUNTER — Emergency Department (HOSPITAL_COMMUNITY): Payer: Medicaid Other

## 2014-08-02 DIAGNOSIS — Z8701 Personal history of pneumonia (recurrent): Secondary | ICD-10-CM | POA: Insufficient documentation

## 2014-08-02 DIAGNOSIS — R0789 Other chest pain: Secondary | ICD-10-CM | POA: Diagnosis not present

## 2014-08-02 DIAGNOSIS — R110 Nausea: Secondary | ICD-10-CM | POA: Diagnosis not present

## 2014-08-02 DIAGNOSIS — R197 Diarrhea, unspecified: Secondary | ICD-10-CM | POA: Insufficient documentation

## 2014-08-02 DIAGNOSIS — Z79899 Other long term (current) drug therapy: Secondary | ICD-10-CM | POA: Diagnosis not present

## 2014-08-02 DIAGNOSIS — Z8739 Personal history of other diseases of the musculoskeletal system and connective tissue: Secondary | ICD-10-CM | POA: Insufficient documentation

## 2014-08-02 DIAGNOSIS — G8929 Other chronic pain: Secondary | ICD-10-CM | POA: Diagnosis not present

## 2014-08-02 DIAGNOSIS — Z7982 Long term (current) use of aspirin: Secondary | ICD-10-CM | POA: Insufficient documentation

## 2014-08-02 DIAGNOSIS — Z87440 Personal history of urinary (tract) infections: Secondary | ICD-10-CM | POA: Diagnosis not present

## 2014-08-02 DIAGNOSIS — Z720 Tobacco use: Secondary | ICD-10-CM | POA: Insufficient documentation

## 2014-08-02 DIAGNOSIS — R079 Chest pain, unspecified: Secondary | ICD-10-CM | POA: Diagnosis present

## 2014-08-02 DIAGNOSIS — F909 Attention-deficit hyperactivity disorder, unspecified type: Secondary | ICD-10-CM | POA: Insufficient documentation

## 2014-08-02 LAB — COMPREHENSIVE METABOLIC PANEL
ALT: 9 U/L — ABNORMAL LOW (ref 14–54)
ANION GAP: 4 — AB (ref 5–15)
AST: 15 U/L (ref 15–41)
Albumin: 4.1 g/dL (ref 3.5–5.0)
Alkaline Phosphatase: 63 U/L (ref 38–126)
BUN: 5 mg/dL — AB (ref 6–20)
CALCIUM: 8.8 mg/dL — AB (ref 8.9–10.3)
CHLORIDE: 108 mmol/L (ref 101–111)
CO2: 24 mmol/L (ref 22–32)
CREATININE: 0.74 mg/dL (ref 0.44–1.00)
GFR calc Af Amer: >60 mL/min (ref >60)
GLUCOSE: 85 mg/dL (ref 65–99)
POTASSIUM: 3.7 mmol/L (ref 3.5–5.1)
SODIUM: 136 mmol/L (ref 135–145)
Total Bilirubin: 0.6 mg/dL (ref 0.3–1.2)
Total Protein: 7.3 g/dL (ref 6.5–8.1)

## 2014-08-02 LAB — CBC WITH DIFFERENTIAL/PLATELET
BASOS ABS: 0 10*3/uL (ref 0.0–0.1)
Basophils Relative: 0 % (ref 0–1)
EOS PCT: 1 % (ref 0–5)
Eosinophils Absolute: 0.1 10*3/uL (ref 0.0–0.7)
HCT: 38.6 % (ref 36.0–46.0)
HEMOGLOBIN: 13.5 g/dL (ref 12.0–15.0)
LYMPHS ABS: 2.4 10*3/uL (ref 0.7–4.0)
Lymphocytes Relative: 28 % (ref 12–46)
MCH: 31.8 pg (ref 26.0–34.0)
MCHC: 35 g/dL (ref 30.0–36.0)
MCV: 91 fL (ref 78.0–100.0)
Monocytes Absolute: 0.4 10*3/uL (ref 0.1–1.0)
Monocytes Relative: 4 % (ref 3–12)
Neutro Abs: 5.7 10*3/uL (ref 1.7–7.7)
Neutrophils Relative %: 67 % (ref 43–77)
Platelets: 261 10*3/uL (ref 150–400)
RBC: 4.24 MIL/uL (ref 3.87–5.11)
RDW: 12.6 % (ref 11.5–15.5)
WBC: 8.6 10*3/uL (ref 4.0–10.5)

## 2014-08-02 LAB — TROPONIN I: Troponin I: <0.03 ng/mL (ref <0.031)

## 2014-08-02 MED ORDER — KETOROLAC TROMETHAMINE 30 MG/ML IJ SOLN
30.0000 mg | Freq: Once | INTRAMUSCULAR | Status: AC
  Administered 2014-08-02: 30 mg via INTRAVENOUS
  Filled 2014-08-02: qty 1

## 2014-08-02 MED ORDER — SODIUM CHLORIDE 0.9 % IV BOLUS (SEPSIS)
1000.0000 mL | Freq: Once | INTRAVENOUS | Status: AC
  Administered 2014-08-02: 1000 mL via INTRAVENOUS

## 2014-08-02 MED ORDER — HYDROCODONE-ACETAMINOPHEN 5-325 MG PO TABS: 1.0000 | ORAL_TABLET | Freq: Four times a day (QID) | ORAL | Status: DC | PRN

## 2014-08-02 NOTE — ED Provider Notes (Signed)
CSN: [id number]     Arrival date & time 08/02/14  2034 History  This chart was scribed for Bethann Berkshire, MD by Placido Sou, ED scribe. This patient was seen in room APA12/APA12 and the patient's care was started at 9:46 PM.    Chief Complaint  Patient presents with  . Chest Pain   Patient is a 39 y.o. female presenting with chest pain. The history is provided by the patient (the pt complains of chest pain.  she is under alot of stress because brother tried to commit suicide). No language interpreter was used.  Chest Pain Pain location:  L chest Pain quality: pressure   Pain radiates to:  Does not radiate Pain radiates to the back: no   Pain severity:  Moderate Onset quality:  Sudden Duration:  5 hours Timing:  Constant Progression:  Unchanged Chronicity:  New Context: stress   Associated symptoms: nausea   Associated symptoms: no abdominal pain, no back pain, no cough, no fatigue, no headache and not vomiting   Risk factors: smoking   Risk factors: no aortic disease, no coronary artery disease, no diabetes mellitus, not obese and no prior DVT/PE     HPI Comments: Linda Calderon is a 39 y.o. female who presents to the Emergency Department complaining of constant, moderate, left sided chest pain with onset 4.5 hours ago. She notes associated nausea, diarrhea, lightheadedness and a recent appetite change. Pt notes recent stress due to family issues, with her brother attempting suicide 6 days ago and further notes taking her Xanax and antidepressant medications as prescribed. She denies vomiting or any other associated symptoms.   Past Medical History  Diagnosis Date  . ADD (attention deficit disorder)   . PTSD (post-traumatic stress disorder)   . Pneumonia   . UTI (lower urinary tract infection)   . Chronic neck and back pain   . Radiculopathy    Past Surgical History  Procedure Laterality Date  . Abdominal hysterectomy    . Tubal ligation     Family History  Problem  Relation Age of Onset  . Hypertension Mother   . Hypertension Father   . Cancer Father    History  Substance Use Topics  . Smoking status: Current Every Day Smoker -- 1.00 packs/day for 10 years    Types: Cigarettes  . Smokeless tobacco: Never Used  . Alcohol Use: No   OB History    Gravida Para Term Preterm AB TAB SAB Ectopic Multiple Living   8 1 1  7  7   1      Review of Systems  Constitutional: Positive for appetite change. Negative for fatigue.  HENT: Negative for congestion, ear discharge and sinus pressure.   Eyes: Negative for discharge.  Respiratory: Negative for cough.   Cardiovascular: Positive for chest pain.  Gastrointestinal: Positive for nausea and diarrhea. Negative for vomiting and abdominal pain.  Genitourinary: Negative for frequency and hematuria.  Musculoskeletal: Negative for back pain.  Skin: Negative for rash.  Neurological: Positive for light-headedness. Negative for seizures and headaches.  Psychiatric/Behavioral: Negative for hallucinations.   Allergies  Morphine and related  Home Medications   Prior to Admission medications   Medication Sig Start Date End Date Taking? Authorizing Provider  ALPRAZolam ) 1 MG tablet Take 1 mg by mouth 3 (three) times daily.    Historical Provider, MD  amphetamine-dextroamphetamine (ADDERALL) 20 MG tablet Take 10 mg by mouth every morning.     Historical Provider, MD  Aspirin-Acetaminophen-Caffeine (GOODY HEADACHE  PO) Take 1 packet by mouth 2 (two) times daily as needed (FOR PAIN).    Historical Provider, MD  FLUoxetine (PROZAC) 20 MG capsule Take 20 mg by mouth daily.    Historical Provider, MD  HYDROcodone-acetaminophen (NORCO/VICODIN) 5-325 MG per tablet Take 1 tablet by mouth every 4 (four) hours as needed. Patient not taking: Reported on 07/17/2014 05/05/14   Burgess Amor, PA-C  ibuprofen (ADVIL,MOTRIN) 200 MG tablet Take 600 mg by mouth every 6 (six) hours as needed for mild pain or moderate pain.     Historical Provider, MD  methocarbamol (ROBAXIN) 500 MG tablet Take 2 tablets (1,000 mg total) by mouth 4 (four) times daily as needed for muscle spasms (muscle spasm/pain). 07/17/14   Samuel Jester, DO  metroNIDAZOLE (FLAGYL) 500 MG tablet Take 1 tablet (500 mg total) by mouth 2 (two) times daily. Patient not taking: Reported on 05/05/2014 03/07/14   Samuel Jester, DO  Multiple Vitamin (MULTIVITAMIN WITH MINERALS) TABS tablet Take 1 tablet by mouth every evening.    Historical Provider, MD  naproxen (NAPROSYN) 250 MG tablet Take 1 tablet (250 mg total) by mouth 2 (two) times daily as needed for mild pain or moderate pain (take with food). 07/17/14   Samuel Jester, DO  ondansetron (ZOFRAN ODT) 8 MG disintegrating tablet Take 1 tablet (8 mg total) by mouth every 8 (eight) hours as needed for nausea or vomiting. Patient not taking: Reported on 03/07/2014 02/27/14   Margarita Grizzle, MD  ondansetron (ZOFRAN) 4 MG tablet Take 1 tablet (4 mg total) by mouth every 8 (eight) hours as needed for nausea or vomiting. 07/17/14   Samuel Jester, DO  predniSONE (DELTASONE) 10 MG tablet 6, 5, 4, 3, 2 then 1 tablet by mouth daily for 6 days total. Patient not taking: Reported on 07/17/2014 05/05/14   Burgess Amor, PA-C   BP 92/76 mmHg  Pulse 98  Temp(Src) 98.3 F (36.8 C) (Oral)  Resp 18  Ht 5\' 4"  (1.626 m)  Wt 101 lb (45.813 kg)  BMI 17.33 kg/m2  SpO2 99%  LMP 03/09/2011 Physical Exam  Constitutional: She is oriented to person, place, and time. She appears well-developed.  HENT:  Head: Normocephalic.  Eyes: Conjunctivae and EOM are normal. No scleral icterus.  Neck: Neck supple. No thyromegaly present.  Cardiovascular: Normal rate and regular rhythm.  Exam reveals no gallop and no friction rub.   No murmur heard. Pain to left anterior chest  Pulmonary/Chest: No stridor. She has no wheezes. She has no rales. She exhibits no tenderness.  Abdominal: She exhibits no distension. There is no tenderness. There  is no rebound.  Musculoskeletal: Normal range of motion. She exhibits no edema.  Lymphadenopathy:    She has no cervical adenopathy.  Neurological: She is oriented to person, place, and time. She exhibits normal muscle tone. Coordination normal.  Skin: No rash noted. No erythema.  Psychiatric: She has a normal mood and affect. Her behavior is normal.    ED Course  Procedures  DIAGNOSTIC STUDIES: Oxygen Saturation is 99% on RA, normal by my interpretation.    COORDINATION OF CARE: 9:49 PM Discussed treatment plan with pt at bedside and pt agreed to plan.  Labs Review Labs Reviewed  TROPONIN I    Imaging Review Dg Chest 2 View  08/02/2014   CLINICAL DATA:  39 year old female with chest pressure  EXAM: CHEST  2 VIEW  COMPARISON:  Radiograph dated 12/27/2010  FINDINGS: Two views of the chest demonstrate emphysematous changes of the  lungs. No focal consolidation, pleural effusion, or pneumothorax. The cardiomediastinal silhouette is within normal limits. The osseous structures are grossly unremarkable.  IMPRESSION: No active cardiopulmonary disease.   Electronically Signed   By: Elgie Collard M.D.   On: 08/02/2014 21:19     EKG Interpretation   Date/Time:  Thursday August 02 2014 20:41:12 EDT Ventricular Rate:  75 PR Interval:  134 QRS Duration: 76 QT Interval:  376 QTC Calculation: 419 R Axis:   86 Text Interpretation:  Normal sinus rhythm Normal ECG ED PHYSICIAN  INTERPRETATION AVAILABLE IN [facility] Confirmed by TEST, Record  ([id number]) on 08/04/2014 8:11:54 AM      MDM   Final diagnoses:  None   Chest pain most likely related to stress,   Unremarkable labs, ekg and chest x-ray.  Pt is to continue her xanax and antidepressant and follow up with pcp  The chart was scribed for me under my direct supervision.  I personally performed the history, physical, and medical decision making and all procedures in the evaluation of this patient.Bethann Berkshire, MD 08/06/14  [phone]
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
## 2014-08-02 NOTE — Discharge Instructions (Signed)
Follow up with your md next week. °

## 2014-09-19 ENCOUNTER — Emergency Department (HOSPITAL_COMMUNITY)
Admission: EM | Admit: 2014-09-19 | Discharge: 2014-09-19 | Disposition: A | Discharge status: Home / Self Care | Payer: Medicaid Other | Attending: Emergency Medicine | Admitting: Emergency Medicine

## 2014-09-19 ENCOUNTER — Encounter (HOSPITAL_COMMUNITY): Payer: Self-pay | Admitting: *Deleted

## 2014-09-19 DIAGNOSIS — M25511 Pain in right shoulder: Secondary | ICD-10-CM | POA: Diagnosis not present

## 2014-09-19 DIAGNOSIS — Z8701 Personal history of pneumonia (recurrent): Secondary | ICD-10-CM | POA: Insufficient documentation

## 2014-09-19 DIAGNOSIS — Z720 Tobacco use: Secondary | ICD-10-CM | POA: Insufficient documentation

## 2014-09-19 DIAGNOSIS — F909 Attention-deficit hyperactivity disorder, unspecified type: Secondary | ICD-10-CM | POA: Diagnosis not present

## 2014-09-19 DIAGNOSIS — Z87440 Personal history of urinary (tract) infections: Secondary | ICD-10-CM | POA: Insufficient documentation

## 2014-09-19 DIAGNOSIS — G8929 Other chronic pain: Secondary | ICD-10-CM | POA: Insufficient documentation

## 2014-09-19 DIAGNOSIS — Z79899 Other long term (current) drug therapy: Secondary | ICD-10-CM | POA: Diagnosis not present

## 2014-09-19 DIAGNOSIS — M79601 Pain in right arm: Secondary | ICD-10-CM | POA: Diagnosis present

## 2014-09-19 MED ORDER — PREDNISONE 10 MG PO TABS: 20.0000 mg | ORAL_TABLET | Freq: Two times a day (BID) | ORAL | Status: DC

## 2014-09-19 NOTE — ED Notes (Signed)
Pt states intermittent problem x ~1 year. States that 4 days ago, pain became worse again. States burning down right arm with tingling to fingertips. Pain is worse with movement. Had to leave work today due to pain.

## 2014-09-19 NOTE — ED Provider Notes (Signed)
CSN: [id number]     Arrival date & time 09/19/14  1052 History   This chart was scribed for Geoffery Lyons, MD by Gwenyth Ober, ED Scribe. This patient was seen in room APA01/APA01 and the patient's care was started at 11:12 AM.    Chief Complaint  Patient presents with  . Arm Pain   The history is provided by the patient. No language interpreter was used.    HPI Comments: Linda Calderon is a 39 y.o. female who presents to the Emergency Department complaining of constant, moderate, burning right arm pain, worst in her joints, that started 4 days ago. Pt states tingling of her distal right fingers and swelling of her right hand as an associated symptom. She has tried [employer] and Ibuprofen with no relief. 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 She denies a history of similar symptoms. Pt also denies neck pain and numbness.  Past Medical History  Diagnosis Date  . ADD (attention deficit disorder)   . PTSD (post-traumatic stress disorder)   . Pneumonia   . UTI (lower urinary tract infection)   . Chronic neck and back pain   . Radiculopathy    Past Surgical History  Procedure Laterality Date  . Abdominal hysterectomy    . Tubal ligation     Family History  Problem Relation Age of Onset  . Hypertension Mother   . Hypertension Father   . Cancer Father    Social History  Substance Use Topics  . Smoking status: Current Every Day Smoker -- 1.00 packs/day for 10 years    Types: Cigarettes  . Smokeless tobacco: Never Used  . Alcohol Use: No   OB History    Gravida Para Term Preterm AB TAB SAB Ectopic Multiple Living   8 1 1  7  7   1      Review of Systems  Musculoskeletal: Positive for joint swelling and arthralgias. Negative for neck pain.  Neurological: Negative for numbness.  All other systems reviewed and are negative.   Allergies  Morphine and related  Home Medications   Prior to Admission medications   Medication Sig Start  Date End Date Taking? Authorizing Provider  ALPRAZolam ) 1 MG tablet Take 1 mg by mouth 3 (three) times daily.    Historical Provider, MD  amphetamine-dextroamphetamine (ADDERALL) 20 MG tablet Take 10 mg by mouth every morning.     Historical Provider, MD  Aspirin-Acetaminophen-Caffeine (GOODY HEADACHE PO) Take 1 packet by mouth 2 (two) times daily as needed (FOR PAIN).    Historical Provider, MD  FLUoxetine (PROZAC) 20 MG capsule Take 20 mg by mouth daily.    Historical Provider, MD  HYDROcodone-acetaminophen (NORCO/VICODIN) 5-325 MG per tablet Take 1 tablet by mouth every 6 (six) hours as needed for moderate pain. 08/02/14   08/04/14, MD  ibuprofen (ADVIL,MOTRIN) 200 MG tablet Take 600 mg by mouth every 6 (six) hours as needed for mild pain or moderate pain.    Historical Provider, MD  methocarbamol (ROBAXIN) 500 MG tablet Take 2 tablets (1,000 mg total) by mouth 4 (four) times daily as needed for muscle spasms (muscle spasm/pain). Patient not taking: Reported on 08/02/2014 07/17/14   07/19/14, DO  metroNIDAZOLE (FLAGYL) 500 MG tablet Take 1 tablet (500 mg total) by mouth 2 (two) times daily. Patient not taking: Reported on 05/05/2014 03/07/14   03/09/14, DO  Multiple Vitamin (MULTIVITAMIN WITH MINERALS) TABS tablet Take 1 tablet  by mouth every evening.    Historical Provider, MD  naproxen (NAPROSYN) 250 MG tablet Take 1 tablet (250 mg total) by mouth 2 (two) times daily as needed for mild pain or moderate pain (take with food). Patient not taking: Reported on 08/02/2014 07/17/14   Samuel Jester, DO  ondansetron (ZOFRAN ODT) 8 MG disintegrating tablet Take 1 tablet (8 mg total) by mouth every 8 (eight) hours as needed for nausea or vomiting. Patient not taking: Reported on 03/07/2014 02/27/14   Margarita Grizzle, MD  ondansetron (ZOFRAN) 4 MG tablet Take 1 tablet (4 mg total) by mouth every 8 (eight) hours as needed for nausea or vomiting. Patient not taking: Reported on 08/02/2014  07/17/14   Samuel Jester, DO  predniSONE (DELTASONE) 10 MG tablet 6, 5, 4, 3, 2 then 1 tablet by mouth daily for 6 days total. Patient not taking: Reported on 07/17/2014 05/05/14   Burgess Amor, PA-C   BP 101/74 mmHg  Pulse 83  Temp(Src) 97.6 F (36.4 C) (Oral)  Resp 14  Ht 5\' 4"  (1.626 m)  Wt 102 lb (46.267 kg)  BMI 17.50 kg/m2  SpO2 99%  LMP 03/09/2011 Physical Exam  Constitutional: She is oriented to person, place, and time. She appears well-developed and well-nourished.  HENT:  Head: Normocephalic and atraumatic.  Eyes: Conjunctivae are normal. Right eye exhibits no discharge. Left eye exhibits no discharge.  Pulmonary/Chest: Effort normal. No respiratory distress.  Musculoskeletal:  Right shoulder appears grossly normal as does the remainder of the right UE. Ulnar and radial pulses are easily palpable. Pt is able to flex, extend and oppose all fingers without difficulty.  Neurological: She is alert and oriented to person, place, and time. Coordination normal.  Skin: Skin is warm and dry. No rash noted. She is not diaphoretic. No erythema.  Psychiatric: She has a normal mood and affect.  Nursing note and vitals reviewed.   ED Course  Procedures   DIAGNOSTIC STUDIES: Oxygen Saturation is 99% on RA, normal by my interpretation.    COORDINATION OF CARE: 11:23 AM Discussed treatment plan with pt at bedside and pt agreed to plan.   Labs Review Labs Reviewed - No data to display  Imaging Review No results found.   EKG Interpretation None      MDM   Final diagnoses:  None    This appears to be a shoulder sprain / possible tendinitis.  Will treat with nsaids, prn return.  I personally performed the services described in this documentation, which was scribed in my presence. The recorded information has been reviewed and is accurate.      03/11/2011, MD 09/19/14 [phone]

## 2014-09-19 NOTE — Discharge Instructions (Signed)
Prednisone as prescribed.  Ibuprofen 600 mg every 6 hours as needed for pain.  Follow-up with your primary Dr. if not improving in the next week.   Shoulder Pain The shoulder is the joint that connects your arms to your body. The bones that form the shoulder joint include the upper arm bone (humerus), the shoulder blade (scapula), and the collarbone (clavicle). The top of the humerus is shaped like a ball and fits into a rather flat socket on the scapula (glenoid cavity). A combination of muscles and strong, fibrous tissues that connect muscles to bones (tendons) support your shoulder joint and hold the ball in the socket. Small, fluid-filled sacs (bursae) are located in different areas of the joint. They act as cushions between the bones and the overlying soft tissues and help reduce friction between the gliding tendons and the bone as you move your arm. Your shoulder joint allows a wide range of motion in your arm. This range of motion allows you to do things like scratch your back or throw a ball. However, this range of motion also makes your shoulder more prone to pain from overuse and injury. Causes of shoulder pain can originate from both injury and overuse and usually can be grouped in the following four categories:  Redness, swelling, and pain (inflammation) of the tendon (tendinitis) or the bursae (bursitis).  Instability, such as a dislocation of the joint.  Inflammation of the joint (arthritis).  Broken bone (fracture). HOME CARE INSTRUCTIONS   Apply ice to the sore area.  Put ice in a plastic bag.  Place a towel between your skin and the bag.  Leave the ice on for 15-20 minutes, 3-4 times per day for the first 2 days, or as directed by your health care provider.  Stop using cold packs if they do not help with the pain.  If you have a shoulder sling or immobilizer, wear it as long as your caregiver instructs. Only remove it to shower or bathe. Move your arm as little as  possible, but keep your hand moving to prevent swelling.  Squeeze a soft ball or foam pad as much as possible to help prevent swelling.  Only take over-the-counter or prescription medicines for pain, discomfort, or fever as directed by your caregiver. SEEK MEDICAL CARE IF:   Your shoulder pain increases, or new pain develops in your arm, hand, or fingers.  Your hand or fingers become cold and numb.  Your pain is not relieved with medicines. SEEK IMMEDIATE MEDICAL CARE IF:   Your arm, hand, or fingers are numb or tingling.  Your arm, hand, or fingers are significantly swollen or turn white or blue. MAKE SURE YOU:   Understand these instructions.  Will watch your condition.  Will get help right away if you are not doing well or get worse. Document Released: 10/15/2004 Document Revised: 05/22/2013 Document Reviewed: 12/20/2010 [facility] Patient Information 2015 [place], [place]. This information is not intended to replace advice given to you by your health care provider. Make sure you discuss any questions you have with your health care provider.

## 2014-10-16 ENCOUNTER — Encounter (HOSPITAL_COMMUNITY): Payer: Self-pay | Admitting: Emergency Medicine

## 2014-10-16 ENCOUNTER — Emergency Department (HOSPITAL_COMMUNITY)
Admission: EM | Admit: 2014-10-16 | Discharge: 2014-10-16 | Disposition: A | Discharge status: Home / Self Care | Payer: Medicaid Other | Attending: Emergency Medicine | Admitting: Emergency Medicine

## 2014-10-16 ENCOUNTER — Emergency Department (HOSPITAL_COMMUNITY): Payer: Medicaid Other

## 2014-10-16 DIAGNOSIS — R05 Cough: Secondary | ICD-10-CM | POA: Diagnosis present

## 2014-10-16 DIAGNOSIS — Z87440 Personal history of urinary (tract) infections: Secondary | ICD-10-CM | POA: Diagnosis not present

## 2014-10-16 DIAGNOSIS — M791 Myalgia: Secondary | ICD-10-CM | POA: Diagnosis not present

## 2014-10-16 DIAGNOSIS — G8929 Other chronic pain: Secondary | ICD-10-CM | POA: Insufficient documentation

## 2014-10-16 DIAGNOSIS — J3489 Other specified disorders of nose and nasal sinuses: Secondary | ICD-10-CM | POA: Insufficient documentation

## 2014-10-16 DIAGNOSIS — Z79899 Other long term (current) drug therapy: Secondary | ICD-10-CM | POA: Insufficient documentation

## 2014-10-16 DIAGNOSIS — J209 Acute bronchitis, unspecified: Secondary | ICD-10-CM | POA: Diagnosis not present

## 2014-10-16 DIAGNOSIS — Z720 Tobacco use: Secondary | ICD-10-CM | POA: Insufficient documentation

## 2014-10-16 DIAGNOSIS — Z8701 Personal history of pneumonia (recurrent): Secondary | ICD-10-CM | POA: Diagnosis not present

## 2014-10-16 DIAGNOSIS — J40 Bronchitis, not specified as acute or chronic: Secondary | ICD-10-CM

## 2014-10-16 DIAGNOSIS — Z7952 Long term (current) use of systemic steroids: Secondary | ICD-10-CM | POA: Diagnosis not present

## 2014-10-16 DIAGNOSIS — F909 Attention-deficit hyperactivity disorder, unspecified type: Secondary | ICD-10-CM | POA: Insufficient documentation

## 2014-10-16 MED ORDER — AZITHROMYCIN 250 MG PO TABS: 250.0000 mg | ORAL_TABLET | Freq: Every day | ORAL | Status: DC

## 2014-10-16 NOTE — ED Notes (Signed)
Having non-productive cough for last 7 days.  body aches and headaches.  Taking OTC medications for cold without relief.

## 2014-10-16 NOTE — Discharge Instructions (Signed)

## 2014-10-16 NOTE — ED Notes (Signed)
Patient with no complaints at this time. Respirations even and unlabored. Skin warm/dry. Discharge instructions reviewed with patient at this time. Patient given opportunity to voice concerns/ask questions. Patient discharged at this time and left Emergency Department with steady gait.   

## 2014-10-16 NOTE — ED Provider Notes (Signed)
CSN: [id number]     Arrival date & time 10/16/14  0913 History   First MD Initiated Contact with Patient 10/16/14 [phone]     Chief Complaint  Patient presents with  . Cough     (Consider location/radiation/quality/duration/timing/severity/associated sxs/prior Treatment) Patient is a 39 y.o. female presenting with cough. The history is provided by the patient. No language interpreter was used.  Cough Cough characteristics:  Productive Sputum characteristics:  Nondescript Severity:  Moderate Onset quality:  Gradual Duration:  1 week Timing:  Constant Progression:  Worsening Chronicity:  New Smoker: no   Context: not sick contacts   Relieved by:  Nothing Worsened by:  Nothing tried Ineffective treatments:  None tried Associated symptoms: fever, myalgias and rhinorrhea   Associated symptoms: no chest pain     Past Medical History  Diagnosis Date  . ADD (attention deficit disorder)   . PTSD (post-traumatic stress disorder)   . Pneumonia   . UTI (lower urinary tract infection)   . Chronic neck and back pain   . Radiculopathy    Past Surgical History  Procedure Laterality Date  . Abdominal hysterectomy    . Tubal ligation     Family History  Problem Relation Age of Onset  . Hypertension Mother   . Hypertension Father   . Cancer Father    Social History  Substance Use Topics  . Smoking status: Current Every Day Smoker -- 1.00 packs/day for 10 years    Types: Cigarettes  . Smokeless tobacco: Never Used  . Alcohol Use: No   OB History    Gravida Para Term Preterm AB TAB SAB Ectopic Multiple Living   8 1 1  7  7   1      Review of Systems  Constitutional: Positive for fever.  HENT: Positive for rhinorrhea.   Respiratory: Positive for cough.   Cardiovascular: Negative for chest pain.  Musculoskeletal: Positive for myalgias.  All other systems reviewed and are negative.     Allergies  Morphine and related  Home Medications   Prior to Admission medications    Medication Sig Start Date End Date Taking? Authorizing Provider  ALPRAZolam ) 1 MG tablet Take 1 mg by mouth 3 (three) times daily.    Historical Provider, MD  amphetamine-dextroamphetamine (ADDERALL) 20 MG tablet Take 10 mg by mouth every morning.     Historical Provider, MD  Aspirin-Acetaminophen-Caffeine (GOODY HEADACHE PO) Take 1 packet by mouth 2 (two) times daily as needed (FOR PAIN).    Historical Provider, MD  FLUoxetine (PROZAC) 20 MG capsule Take 20 mg by mouth daily.    Historical Provider, MD  HYDROcodone-acetaminophen (NORCO/VICODIN) 5-325 MG per tablet Take 1 tablet by mouth every 6 (six) hours as needed for moderate pain. 08/02/14   08/04/14, MD  ibuprofen (ADVIL,MOTRIN) 200 MG tablet Take 600 mg by mouth every 6 (six) hours as needed for mild pain or moderate pain.    Historical Provider, MD  methocarbamol (ROBAXIN) 500 MG tablet Take 2 tablets (1,000 mg total) by mouth 4 (four) times daily as needed for muscle spasms (muscle spasm/pain). Patient not taking: Reported on 08/02/2014 07/17/14   07/19/14, DO  metroNIDAZOLE (FLAGYL) 500 MG tablet Take 1 tablet (500 mg total) by mouth 2 (two) times daily. Patient not taking: Reported on 05/05/2014 03/07/14   03/09/14, DO  Multiple Vitamin (MULTIVITAMIN WITH MINERALS) TABS tablet Take 1 tablet by mouth every evening.    Historical Provider, MD  naproxen (NAPROSYN) 250 MG tablet  Take 1 tablet (250 mg total) by mouth 2 (two) times daily as needed for mild pain or moderate pain (take with food). Patient not taking: Reported on 08/02/2014 07/17/14   Samuel Jester, DO  ondansetron (ZOFRAN ODT) 8 MG disintegrating tablet Take 1 tablet (8 mg total) by mouth every 8 (eight) hours as needed for nausea or vomiting. Patient not taking: Reported on 03/07/2014 02/27/14   Margarita Grizzle, MD  ondansetron (ZOFRAN) 4 MG tablet Take 1 tablet (4 mg total) by mouth every 8 (eight) hours as needed for nausea or vomiting. Patient not taking:  Reported on 08/02/2014 07/17/14   Samuel Jester, DO  predniSONE (DELTASONE) 10 MG tablet Take 2 tablets (20 mg total) by mouth 2 (two) times daily. 09/19/14   Geoffery Lyons, MD   BP 103/76 mmHg  Pulse 70  Temp(Src) 97.8 F (36.6 C) (Oral)  Resp 18  Ht 5\' 4"  (1.626 m)  Wt 105 lb (47.628 kg)  BMI 18.01 kg/m2  SpO2 98%  LMP 03/09/2011 Physical Exam  Constitutional: She appears well-developed and well-nourished.  HENT:  Head: Normocephalic.  Right Ear: External ear normal.  Left Ear: External ear normal.  Nose: Nose normal.  Mouth/Throat: Oropharynx is clear and moist.  Eyes: Conjunctivae are normal. Pupils are equal, round, and reactive to light.  Neck: Normal range of motion. Neck supple.  Cardiovascular: Normal rate and regular rhythm.   Pulmonary/Chest: Effort normal and breath sounds normal.  Abdominal: Soft.  Musculoskeletal: Normal range of motion.  Neurological: She is alert.  Skin: Skin is warm.  Psychiatric: She has a normal mood and affect.  Nursing note and vitals reviewed.   ED Course  Procedures (including critical care time) Labs Review Labs Reviewed - No data to display  Imaging Review Dg Chest 2 View  10/16/2014   CLINICAL DATA:  Nonproductive cough.  EXAM: CHEST  2 VIEW  COMPARISON:  August 02, 2014.  FINDINGS: The heart size and mediastinal contours are within normal limits. Both lungs are clear. No pneumothorax or pleural effusion is noted. Hyperexpansion of the lungs is noted. The visualized skeletal structures are unremarkable.  IMPRESSION: Hyperexpansion of the lungs. No acute cardiopulmonary abnormality seen.   Electronically Signed   By: August 04, 2014, M.D.   On: 10/16/2014 10:22   I have personally reviewed and evaluated these images and lab results as part of my medical decision-making.   EKG Interpretation None      MDM I suspect pt has bronchitis.  I will treat with zithromax. Pt dvised to return if any problems.   Final diagnoses:   Bronchitis    zithromax avs    10/18/2014, PA-C 10/16/14 1049  10/18/14, MD 10/17/14 [phone]

## 2015-03-05 ENCOUNTER — Encounter (HOSPITAL_COMMUNITY): Payer: Self-pay | Admitting: *Deleted

## 2015-03-05 ENCOUNTER — Emergency Department (HOSPITAL_COMMUNITY)
Admission: EM | Admit: 2015-03-05 | Discharge: 2015-03-05 | Disposition: A | Discharge status: Home / Self Care | Payer: Medicaid Other | Attending: Emergency Medicine | Admitting: Emergency Medicine

## 2015-03-05 DIAGNOSIS — Z8701 Personal history of pneumonia (recurrent): Secondary | ICD-10-CM | POA: Diagnosis not present

## 2015-03-05 DIAGNOSIS — F909 Attention-deficit hyperactivity disorder, unspecified type: Secondary | ICD-10-CM | POA: Insufficient documentation

## 2015-03-05 DIAGNOSIS — Y998 Other external cause status: Secondary | ICD-10-CM | POA: Diagnosis not present

## 2015-03-05 DIAGNOSIS — X58XXXA Exposure to other specified factors, initial encounter: Secondary | ICD-10-CM | POA: Diagnosis not present

## 2015-03-05 DIAGNOSIS — S39012A Strain of muscle, fascia and tendon of lower back, initial encounter: Secondary | ICD-10-CM | POA: Insufficient documentation

## 2015-03-05 DIAGNOSIS — Z87440 Personal history of urinary (tract) infections: Secondary | ICD-10-CM | POA: Insufficient documentation

## 2015-03-05 DIAGNOSIS — F17210 Nicotine dependence, cigarettes, uncomplicated: Secondary | ICD-10-CM | POA: Diagnosis not present

## 2015-03-05 DIAGNOSIS — Z792 Long term (current) use of antibiotics: Secondary | ICD-10-CM | POA: Diagnosis not present

## 2015-03-05 DIAGNOSIS — Z79899 Other long term (current) drug therapy: Secondary | ICD-10-CM | POA: Insufficient documentation

## 2015-03-05 DIAGNOSIS — Y9389 Activity, other specified: Secondary | ICD-10-CM | POA: Diagnosis not present

## 2015-03-05 DIAGNOSIS — G8929 Other chronic pain: Secondary | ICD-10-CM | POA: Diagnosis not present

## 2015-03-05 DIAGNOSIS — Y9289 Other specified places as the place of occurrence of the external cause: Secondary | ICD-10-CM | POA: Diagnosis not present

## 2015-03-05 DIAGNOSIS — Z7952 Long term (current) use of systemic steroids: Secondary | ICD-10-CM | POA: Diagnosis not present

## 2015-03-05 DIAGNOSIS — S3992XA Unspecified injury of lower back, initial encounter: Secondary | ICD-10-CM | POA: Diagnosis present

## 2015-03-05 LAB — URINALYSIS, ROUTINE W REFLEX MICROSCOPIC
Glucose, UA: NEGATIVE mg/dL
Hgb urine dipstick: NEGATIVE
Leukocytes, UA: NEGATIVE
NITRITE: NEGATIVE
Protein, ur: NEGATIVE mg/dL
Specific Gravity, Urine: >1.03 — ABNORMAL HIGH (ref 1.005–1.030)
pH: 6 (ref 5.0–8.0)

## 2015-03-05 MED ORDER — DICLOFENAC SODIUM 75 MG PO TBEC: 75.0000 mg | DELAYED_RELEASE_TABLET | Freq: Two times a day (BID) | ORAL | Status: DC

## 2015-03-05 MED ORDER — CYCLOBENZAPRINE HCL 10 MG PO TABS: 10.0000 mg | ORAL_TABLET | Freq: Three times a day (TID) | ORAL | Status: DC | PRN

## 2015-03-05 MED ORDER — IBUPROFEN 800 MG PO TABS
800.0000 mg | ORAL_TABLET | Freq: Once | ORAL | Status: AC
  Administered 2015-03-05: 800 mg via ORAL
  Filled 2015-03-05: qty 1

## 2015-03-05 MED ORDER — CYCLOBENZAPRINE HCL 10 MG PO TABS
10.0000 mg | ORAL_TABLET | Freq: Once | ORAL | Status: AC
  Administered 2015-03-05: 10 mg via ORAL
  Filled 2015-03-05: qty 1

## 2015-03-05 NOTE — ED Notes (Signed)
   03/05/15 1257  Musculoskeletal  Musculoskeletal (WDL) X  Posterior/Spine  Posterior Spine Trauma Present? No  pt reports right lower back pain that started yesterday. Pt denies any injury or new activity. Pt denies having any urinary symptoms.

## 2015-03-05 NOTE — ED Provider Notes (Signed)
CSN: [id number]     Arrival date & time 03/05/15  1245 History   First MD Initiated Contact with Patient 03/05/15 1259     Chief Complaint  Patient presents with  . Back Pain     (Consider location/radiation/quality/duration/timing/severity/associated sxs/prior Treatment) HPI   Linda Calderon is a 40 y.o. female who presents to the Emergency Department complaining of sudden onset of right-sided low back pain that began yesterday. She describes the pain as sharp and persistent. Symptoms are worsened with sitting or walking. Improved somewhat at rest.  She is taking Tylenol without relief. She denies radiation of pain, fever or chills, abdominal pain, urine or bowel changes, numbness or weakness or pain to the lower extremities. No known injury.   Past Medical History  Diagnosis Date  . ADD (attention deficit disorder)   . PTSD (post-traumatic stress disorder)   . Pneumonia   . UTI (lower urinary tract infection)   . Chronic neck and back pain   . Radiculopathy    Past Surgical History  Procedure Laterality Date  . Abdominal hysterectomy    . Tubal ligation     Family History  Problem Relation Age of Onset  . Hypertension Mother   . Hypertension Father   . Cancer Father    Social History  Substance Use Topics  . Smoking status: Current Every Day Smoker -- 1.00 packs/day for 10 years    Types: Cigarettes  . Smokeless tobacco: Never Used  . Alcohol Use: No   OB History    Gravida Para Term Preterm AB TAB SAB Ectopic Multiple Living   8 1 1  7  7   1      Review of Systems  Constitutional: Negative for fever.  Respiratory: Negative for shortness of breath.   Gastrointestinal: Negative for vomiting, abdominal pain and constipation.  Genitourinary: Negative for dysuria, hematuria, flank pain, decreased urine volume and difficulty urinating.  Musculoskeletal: Positive for back pain (Right low back pain). Negative for joint swelling.  Skin: Negative for rash.   Neurological: Negative for weakness and numbness.  All other systems reviewed and are negative.     Allergies  Morphine and related  Home Medications   Prior to Admission medications   Medication Sig Start Date End Date Taking? Authorizing Provider  ALPRAZolam Prudy Feeler) 1 MG tablet Take 1 mg by mouth 3 (three) times daily.    Historical Provider, MD  amphetamine-dextroamphetamine (ADDERALL) 20 MG tablet Take 10 mg by mouth every morning.     Historical Provider, MD  Aspirin-Acetaminophen-Caffeine (GOODY HEADACHE PO) Take 1 packet by mouth 2 (two) times daily as needed (FOR PAIN).    Historical Provider, MD  azithromycin (ZITHROMAX) 250 MG tablet Take 1 tablet (250 mg total) by mouth daily. Take first 2 tablets together, then 1 every day until finished. 10/16/14   Elson Areas, PA-C  FLUoxetine (PROZAC) 20 MG capsule Take 20 mg by mouth daily.    Historical Provider, MD  HYDROcodone-acetaminophen (NORCO/VICODIN) 5-325 MG per tablet Take 1 tablet by mouth every 6 (six) hours as needed for moderate pain. 08/02/14   Bethann Berkshire, MD  ibuprofen (ADVIL,MOTRIN) 200 MG tablet Take 600 mg by mouth every 6 (six) hours as needed for mild pain or moderate pain.    Historical Provider, MD  methocarbamol (ROBAXIN) 500 MG tablet Take 2 tablets (1,000 mg total) by mouth 4 (four) times daily as needed for muscle spasms (muscle spasm/pain). Patient not taking: Reported on 08/02/2014 07/17/14   Nicholos Johns  McManus, DO  metroNIDAZOLE (FLAGYL) 500 MG tablet Take 1 tablet (500 mg total) by mouth 2 (two) times daily. Patient not taking: Reported on 05/05/2014 03/07/14   Samuel Jester, DO  Multiple Vitamin (MULTIVITAMIN WITH MINERALS) TABS tablet Take 1 tablet by mouth every evening.    Historical Provider, MD  naproxen (NAPROSYN) 250 MG tablet Take 1 tablet (250 mg total) by mouth 2 (two) times daily as needed for mild pain or moderate pain (take with food). Patient not taking: Reported on 08/02/2014 07/17/14    Samuel Jester, DO  ondansetron (ZOFRAN ODT) 8 MG disintegrating tablet Take 1 tablet (8 mg total) by mouth every 8 (eight) hours as needed for nausea or vomiting. Patient not taking: Reported on 03/07/2014 02/27/14   Margarita Grizzle, MD  ondansetron (ZOFRAN) 4 MG tablet Take 1 tablet (4 mg total) by mouth every 8 (eight) hours as needed for nausea or vomiting. Patient not taking: Reported on 08/02/2014 07/17/14   Samuel Jester, DO  predniSONE (DELTASONE) 10 MG tablet Take 2 tablets (20 mg total) by mouth 2 (two) times daily. 09/19/14   Geoffery Lyons, MD   BP 106/75 mmHg  Pulse 100  Temp(Src) 97.6 F (36.4 C) (Oral)  Resp 16  Ht 5\' 4"  (1.626 m)  Wt 49.896 kg  BMI 18.87 kg/m2  SpO2 100%  LMP 03/09/2011 Physical Exam  Constitutional: She is oriented to person, place, and time. She appears well-developed and well-nourished. No distress.  HENT:  Head: Normocephalic and atraumatic.  Neck: Normal range of motion. Neck supple.  Cardiovascular: Normal rate, regular rhythm, normal heart sounds and intact distal pulses.   No murmur heard. Pulmonary/Chest: Effort normal and breath sounds normal. No respiratory distress.  Abdominal: Soft. She exhibits no distension. There is no tenderness. There is no rebound and no guarding.  Musculoskeletal: She exhibits tenderness. She exhibits no edema.       Lumbar back: She exhibits tenderness and pain. She exhibits normal range of motion, no swelling, no deformity, no laceration and normal pulse.  ttp of the right lower lumbar paraspinal muscles.  No spinal tenderness.  DP pulses are brisk and symmetrical.  Distal sensation intact.  Pt has 5/5 strength against resistance of bilateral lower extremities. Negative straight leg raise bilaterally    Neurological: She is alert and oriented to person, place, and time. She has normal strength. No sensory deficit. She exhibits normal muscle tone. Coordination and gait normal.  Reflex Scores:      Patellar reflexes are  2+ on the right side and 2+ on the left side.      Achilles reflexes are 2+ on the right side and 2+ on the left side. Skin: Skin is warm and dry. No rash noted.  Nursing note and vitals reviewed.   ED Course  Procedures (including critical care time) Labs Review Labs Reviewed  URINALYSIS, ROUTINE W REFLEX MICROSCOPIC (NOT AT [facility]) - Abnormal; Notable for the following:    Specific Gravity, Urine >1.030 (*)    Bilirubin Urine SMALL (*)    Ketones, ur TRACE (*)    All other components within normal limits    Imaging Review No results found. I have personally reviewed and evaluated these images and lab results as part of my medical decision-making.   EKG Interpretation None      MDM   Final diagnoses:  Lumbar strain, initial encounter    Pt ambulates with a steady gait.  No focal neuro deficits.  No concerning sx's for emergent  neurological process.  U/a neg.  Likely musculoskeletal.  Patient agrees to symptomatic treatment with Flexeril and diclofenac.  PMD f/u    Pauline Aus, PA-C 03/05/15 1428  Zadie Rhine, MD 03/05/15 [phone]

## 2015-03-05 NOTE — ED Notes (Signed)
Pt states she began having lower right side back pain starting last night. Pt denies any injury or any GU symptoms.

## 2015-03-05 NOTE — Discharge Instructions (Signed)

## 2015-03-05 NOTE — ED Notes (Signed)
Pt alert & oriented x4, stable gait. Patient given discharge instructions, paperwork & prescription(s). Patient informed not to drive, operate any equipment & handel any important documents 4 hours after taking pain medication. Patient  instructed to stop at the registration desk to finish any additional paperwork. Patient  verbalized understanding. Pt left department w/ no further questions. 

## 2015-05-28 ENCOUNTER — Encounter (HOSPITAL_COMMUNITY): Payer: Self-pay | Admitting: Emergency Medicine

## 2015-05-28 ENCOUNTER — Emergency Department (HOSPITAL_COMMUNITY)
Admission: EM | Admit: 2015-05-28 | Discharge: 2015-05-28 | Discharge status: Against Medical Advice | Payer: Medicaid Other | Attending: Emergency Medicine | Admitting: Emergency Medicine

## 2015-05-28 DIAGNOSIS — R509 Fever, unspecified: Secondary | ICD-10-CM | POA: Diagnosis present

## 2015-05-28 DIAGNOSIS — F17210 Nicotine dependence, cigarettes, uncomplicated: Secondary | ICD-10-CM | POA: Insufficient documentation

## 2015-05-28 DIAGNOSIS — J029 Acute pharyngitis, unspecified: Secondary | ICD-10-CM | POA: Insufficient documentation

## 2015-05-28 DIAGNOSIS — B349 Viral infection, unspecified: Secondary | ICD-10-CM

## 2015-05-28 MED ORDER — IBUPROFEN 800 MG PO TABS
800.0000 mg | ORAL_TABLET | Freq: Once | ORAL | Status: DC
Start: 2015-05-28 — End: 2015-05-29
  Filled 2015-05-28: qty 1

## 2015-05-28 MED ORDER — ACETAMINOPHEN 325 MG PO TABS
650.0000 mg | ORAL_TABLET | Freq: Once | ORAL | Status: DC
  Filled 2015-05-28: qty 2

## 2015-05-28 MED ORDER — PSEUDOEPHEDRINE HCL 60 MG PO TABS
60.0000 mg | ORAL_TABLET | Freq: Once | ORAL | Status: DC
  Filled 2015-05-28: qty 1

## 2015-05-28 NOTE — ED Provider Notes (Signed)
CSN: [id number]     Arrival date & time 05/28/15  2042 History   First MD Initiated Contact with Patient 05/28/15 2120     Chief Complaint  Patient presents with  . Generalized Body Aches  . Fever     (Consider location/radiation/quality/duration/timing/severity/associated sxs/prior Treatment) Patient is a 40 y.o. female presenting with URI. The history is provided by the patient.  URI Presenting symptoms: congestion, fatigue, fever and sore throat   Severity:  Moderate Onset quality:  Gradual Duration:  3 days Timing:  Intermittent Progression:  Worsening Chronicity:  New Relieved by:  Nothing Ineffective treatments:  OTC medications Associated symptoms: headaches and myalgias   Associated symptoms comment:  Fever and chills Risk factors: sick contacts   Risk factors: no diabetes mellitus, no immunosuppression and no recent travel     Past Medical History  Diagnosis Date  . ADD (attention deficit disorder)   . PTSD (post-traumatic stress disorder)   . Pneumonia   . UTI (lower urinary tract infection)   . Chronic neck and back pain   . Radiculopathy    Past Surgical History  Procedure Laterality Date  . Abdominal hysterectomy    . Tubal ligation     Family History  Problem Relation Age of Onset  . Hypertension Mother   . Hypertension Father   . Cancer Father    Social History  Substance Use Topics  . Smoking status: Current Every Day Smoker -- 1.00 packs/day for 10 years    Types: Cigarettes  . Smokeless tobacco: Never Used  . Alcohol Use: No   OB History    Gravida Para Term Preterm AB TAB SAB Ectopic Multiple Living   8 1 1  7  7   1      Review of Systems  Constitutional: Positive for fever, chills and fatigue.  HENT: Positive for congestion and sore throat.   Musculoskeletal: Positive for myalgias.  Neurological: Positive for headaches.  All other systems reviewed and are negative.     Allergies  Morphine and related  Home Medications    Prior to Admission medications   Medication Sig Start Date End Date Taking? Authorizing Provider  ALPRAZolam ) 1 MG tablet Take 1 mg by mouth 3 (three) times daily.    Historical Provider, MD  amphetamine-dextroamphetamine (ADDERALL) 20 MG tablet Take 10 mg by mouth every morning.     Historical Provider, MD  azithromycin (ZITHROMAX) 250 MG tablet Take 1 tablet (250 mg total) by mouth daily. Take first 2 tablets together, then 1 every day until finished. 10/16/14   10/18/14, PA-C  cyclobenzaprine (FLEXERIL) 10 MG tablet Take 1 tablet (10 mg total) by mouth 3 (three) times daily as needed. 03/05/15   Tammy Triplett, PA-C  diclofenac (VOLTAREN) 75 MG EC tablet Take 1 tablet (75 mg total) by mouth 2 (two) times daily. Take with food 03/05/15   Tammy Triplett, PA-C  FLUoxetine (PROZAC) 20 MG capsule Take 20 mg by mouth daily.    Historical Provider, MD  HYDROcodone-acetaminophen (NORCO/VICODIN) 5-325 MG per tablet Take 1 tablet by mouth every 6 (six) hours as needed for moderate pain. 08/02/14   08/04/14, MD  Multiple Vitamin (MULTIVITAMIN WITH MINERALS) TABS tablet Take 1 tablet by mouth every evening.    Historical Provider, MD   BP 118/73 mmHg  Pulse 78  Temp(Src) 98.2 F (36.8 C) (Oral)  Resp 16  Ht 5\' 4"  (1.626 m)  Wt 49.896 kg  BMI 18.87 kg/m2  SpO2 100%  LMP 03/09/2011 Physical Exam  Constitutional: She is oriented to person, place, and time. She appears well-developed and well-nourished.  Non-toxic appearance.  HENT:  Head: Normocephalic.  Right Ear: Tympanic membrane and external ear normal.  Left Ear: Tympanic membrane and external ear normal.  Uvula is enlarged. Airway is patent. Nasal congestion present.  Eyes: EOM and lids are normal. Pupils are equal, round, and reactive to light.  Neck: Normal range of motion. Neck supple. Carotid bruit is not present.  Cardiovascular: Normal rate, regular rhythm, normal heart sounds, intact distal pulses and normal pulses.    Pulmonary/Chest: Breath sounds normal. No respiratory distress. She has no wheezes. She has no rales.  Abdominal: Soft. Bowel sounds are normal. There is no tenderness. There is no guarding.  Musculoskeletal: Normal range of motion.  Lymphadenopathy:       Head (right side): No submandibular adenopathy present.       Head (left side): No submandibular adenopathy present.    She has no cervical adenopathy.  Neurological: She is alert and oriented to person, place, and time. She has normal strength. No cranial nerve deficit or sensory deficit.  Skin: Skin is warm and dry.  Psychiatric: She has a normal mood and affect. Her speech is normal.  Nursing note and vitals reviewed.   ED Course  Procedures (including critical care time) Labs Review Labs Reviewed - No data to display  Imaging Review No results found. I have personally reviewed and evaluated these images and lab results as part of my medical decision-making.   EKG Interpretation None      MDM  Vital signs reviewed. The examination favors upper respiratory infection. We discussed the need for good hydration, and good handwashing. We discussed the need to use ibuprofen every 6 hours, or Tylenol every 4 hours for fever and aching. The patient will use the decongestant of your choice for the nasal congestion. A work note excusing the patient until May 13 was given to the patient. She will see the primary physician, or return to the emergency department if not improving.   The patient was not in the room when the nurse went to give final instructions and paperwork. The patient did not receive medications that were ordered, nor the work note. Patient left the emergency department AGAINST MEDICAL ADVICE.    Final diagnoses:  None    *I have reviewed nursing notes, vital signs, and all appropriate lab and imaging results for this patient.**    Ivery Quale, PA-C 05/28/15 2147  Ivery Quale, PA-C 05/28/15 2210  Raeford Razor, MD 05/30/15 1308

## 2015-05-28 NOTE — Discharge Instructions (Signed)
Your examination favors a viral illness. Please use a mask until symptoms have resolved. Please use ibuprofen every 6 hours, or Tylenol every 4 hours over the next few days. Please use Claritin-D, or Allegra-D for decongestion. Viral Infections A virus is a type of germ. Viruses can cause:  Minor sore throats.  Aches and pains.  Headaches.  Runny nose.  Rashes.  Watery eyes.  Tiredness.  Coughs.  Loss of appetite.  Feeling sick to your stomach (nausea).  Throwing up (vomiting).  Watery poop (diarrhea). HOME CARE   Only take medicines as told by your doctor.  Drink enough water and fluids to keep your pee (urine) clear or pale yellow. Sports drinks are a good choice.  Get plenty of rest and eat healthy. Soups and broths with crackers or rice are fine. GET HELP RIGHT AWAY IF:   You have a very bad headache.  You have shortness of breath.  You have chest pain or neck pain.  You have an unusual rash.  You cannot stop throwing up.  You have watery poop that does not stop.  You cannot keep fluids down.  You or your child has a temperature by mouth above 102 F (38.9 C), not controlled by medicine.  Your baby is older than 3 months with a rectal temperature of 102 F (38.9 C) or higher.  Your baby is 33 months old or younger with a rectal temperature of 100.4 F (38 C) or higher. MAKE SURE YOU:   Understand these instructions.  Will watch this condition.  Will get help right away if you are not doing well or get worse.   This information is not intended to replace advice given to you by your health care provider. Make sure you discuss any questions you have with your health care provider.   Document Released: 12/19/2007 Document Revised: 03/30/2011 Document Reviewed: 06/13/2014 Elsevier Interactive Patient Education 2016 [employer].  Please increase water, popsicles, Gatorade's, etc. Wash hands frequently. Please see your physicians at the [facility] for follow-up and recheck.

## 2015-05-28 NOTE — ED Notes (Signed)
Patient and boyfriend ran out of room down hallway.

## 2015-05-28 NOTE — ED Notes (Signed)
Patient complaining of fever and body aches x 3 days. 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

## 2015-10-16 ENCOUNTER — Encounter (HOSPITAL_COMMUNITY): Payer: Self-pay

## 2015-10-16 ENCOUNTER — Emergency Department (HOSPITAL_COMMUNITY)
Admission: EM | Admit: 2015-10-16 | Discharge: 2015-10-17 | Disposition: A | Discharge status: Home / Self Care | Payer: Self-pay | Attending: Emergency Medicine | Admitting: Emergency Medicine

## 2015-10-16 ENCOUNTER — Emergency Department (HOSPITAL_COMMUNITY): Payer: Self-pay

## 2015-10-16 DIAGNOSIS — Z79899 Other long term (current) drug therapy: Secondary | ICD-10-CM | POA: Insufficient documentation

## 2015-10-16 DIAGNOSIS — R110 Nausea: Secondary | ICD-10-CM | POA: Insufficient documentation

## 2015-10-16 DIAGNOSIS — R102 Pelvic and perineal pain: Secondary | ICD-10-CM | POA: Insufficient documentation

## 2015-10-16 DIAGNOSIS — Z7982 Long term (current) use of aspirin: Secondary | ICD-10-CM | POA: Insufficient documentation

## 2015-10-16 DIAGNOSIS — F17210 Nicotine dependence, cigarettes, uncomplicated: Secondary | ICD-10-CM | POA: Insufficient documentation

## 2015-10-16 DIAGNOSIS — N760 Acute vaginitis: Secondary | ICD-10-CM | POA: Insufficient documentation

## 2015-10-16 DIAGNOSIS — B9689 Other specified bacterial agents as the cause of diseases classified elsewhere: Secondary | ICD-10-CM

## 2015-10-16 DIAGNOSIS — F909 Attention-deficit hyperactivity disorder, unspecified type: Secondary | ICD-10-CM | POA: Insufficient documentation

## 2015-10-16 LAB — COMPREHENSIVE METABOLIC PANEL
ALK PHOS: 72 U/L (ref 38–126)
ALT: 11 U/L — AB (ref 14–54)
AST: 16 U/L (ref 15–41)
Albumin: 4.1 g/dL (ref 3.5–5.0)
Anion gap: 8 (ref 5–15)
BILIRUBIN TOTAL: 0.2 mg/dL — AB (ref 0.3–1.2)
BUN: 10 mg/dL (ref 6–20)
CALCIUM: 9 mg/dL (ref 8.9–10.3)
CO2: 22 mmol/L (ref 22–32)
CREATININE: 0.78 mg/dL (ref 0.44–1.00)
Chloride: 105 mmol/L (ref 101–111)
Glucose, Bld: 131 mg/dL — ABNORMAL HIGH (ref 65–99)
Potassium: 3.4 mmol/L — ABNORMAL LOW (ref 3.5–5.1)
SODIUM: 135 mmol/L (ref 135–145)
Total Protein: 8 g/dL (ref 6.5–8.1)

## 2015-10-16 LAB — CBC
HCT: 38.7 % (ref 36.0–46.0)
Hemoglobin: 13.5 g/dL (ref 12.0–15.0)
MCH: 32.4 pg (ref 26.0–34.0)
MCHC: 34.9 g/dL (ref 30.0–36.0)
MCV: 92.8 fL (ref 78.0–100.0)
PLATELETS: 320 10*3/uL (ref 150–400)
RBC: 4.17 MIL/uL (ref 3.87–5.11)
RDW: 12.5 % (ref 11.5–15.5)
WBC: 10.7 10*3/uL — AB (ref 4.0–10.5)

## 2015-10-16 LAB — URINALYSIS, ROUTINE W REFLEX MICROSCOPIC
BILIRUBIN URINE: NEGATIVE
Glucose, UA: NEGATIVE mg/dL
HGB URINE DIPSTICK: NEGATIVE
Ketones, ur: NEGATIVE mg/dL
Leukocytes, UA: NEGATIVE
Nitrite: NEGATIVE
PROTEIN: NEGATIVE mg/dL
Specific Gravity, Urine: <1.005 — ABNORMAL LOW (ref 1.005–1.030)
pH: 6 (ref 5.0–8.0)

## 2015-10-16 LAB — LIPASE, BLOOD: Lipase: 32 U/L (ref 11–51)

## 2015-10-16 MED ORDER — SODIUM CHLORIDE 0.9 % IV BOLUS (SEPSIS)
1000.0000 mL | Freq: Once | INTRAVENOUS | Status: AC
  Administered 2015-10-17: 1000 mL via INTRAVENOUS

## 2015-10-16 NOTE — ED Provider Notes (Signed)
[facility]-EMERGENCY DEPT Provider Note   CSN: [id number] Arrival date & time: 10/16/15  2107 By signing my name below, I, Bridgette Habermann, attest that this documentation has been prepared under the direction and in the presence of Devoria Albe, MD. Electronically Signed: Bridgette Habermann, ED Scribe. 10/16/15. 11:31 PM.  Time seen 23:20 PM  History   Chief Complaint Chief Complaint  Patient presents with  . Abdominal Pain   HPI Comments: Linda Calderon is a 40 y.o. female who presents to the Emergency Department complaining of intermittent, sharp, 8/10 RLQ abdominal pain radiating to her right lower flank onset Sep 22 . She notes each episodes last about 5 - 15 minutes. Pt has associated nausea and has blood when she wipes after urinating. Pt also endorses pressure in her abdomen/vaginal area when she urinates. Pain is exacerbated with bending and moving and improved when laying still. No alleviating factors noted. Denies h/o similar symptoms. Pt denies diarrhea, Dyspareunia, dysuria, urinary frequency, vaginal discharge, fever, or any other associated symptoms. She did have nausea today and vomited once earlier today. She has never had this pain before. She denies any known injury.  States her father has renal stones.  Pt is s/p hysterectomy.   The history is provided by the patient. No language interpreter was used.   PCP [facility]  Past Medical History:  Diagnosis Date  . ADD (attention deficit disorder)   . Chronic neck and back pain   . Pneumonia   . PTSD (post-traumatic stress disorder)   . Radiculopathy   . UTI (lower urinary tract infection)     There are no active problems to display for this patient.   Past Surgical History:  Procedure Laterality Date  . ABDOMINAL HYSTERECTOMY    . TUBAL LIGATION      OB History    Gravida Para Term Preterm AB Living   8 1 1   7 1    SAB TAB Ectopic Multiple Live Births   7               Home Medications    Prior to  Admission medications   Medication Sig Start Date End Date Taking? Authorizing Provider  ALPRAZolam Prudy Feeler) 1 MG tablet Take 1 mg by mouth 3 (three) times daily.   Yes Historical Provider, MD  amphetamine-dextroamphetamine (ADDERALL) 20 MG tablet Take 20 mg by mouth every morning.    Yes Historical Provider, MD  Aspirin-Acetaminophen-Caffeine (GOODY HEADACHE PO) Take 1 Package by mouth daily as needed (for pain).   Yes Historical Provider, MD  mirtazapine (REMERON) 45 MG tablet Take 45 mg by mouth at bedtime.   Yes Historical Provider, MD  oxyCODONE-acetaminophen (PERCOCET) 7.5-325 MG tablet Take 1 tablet by mouth every 4 (four) hours as needed for severe pain.   Yes Historical Provider, MD  cyclobenzaprine (FLEXERIL) 5 MG tablet Take 1 tablet (5 mg total) by mouth 3 (three) times daily as needed (pain). 10/17/15   Devoria Albe, MD  metroNIDAZOLE (FLAGYL) 500 MG tablet Take 1 tablet (500 mg total) by mouth 2 (two) times daily. 10/17/15   Devoria Albe, MD  naproxen (NAPROSYN) 250 MG tablet Take 1 tablet (250 mg total) by mouth 2 (two) times daily. 10/17/15   Devoria Albe, MD  ondansetron (ZOFRAN) 4 MG tablet Take 1 tablet (4 mg total) by mouth every 6 (six) hours. 10/17/15   Devoria Albe, MD    Family History Family History  Problem Relation Age of Onset  . Hypertension  Mother   . Hypertension Father   . Cancer Father     Social History Social History  Substance Use Topics  . Smoking status: Current Every Day Smoker    Packs/day: 1.00    Years: 10.00    Types: Cigarettes  . Smokeless tobacco: Never Used  . 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  Gastrointestinal: Positive for abdominal pain and nausea. Negative for diarrhea.  Genitourinary: Positive for flank pain and vaginal bleeding. Negative for dysuria and vaginal discharge.  All other systems reviewed and are  negative.    Physical Exam Updated Vital Signs BP 115/84 (BP Location: Right Arm)   Pulse 79   Temp 98.4 F (36.9 C) (Oral)   Resp 20   Ht 5\' 4"  (1.626 m)   Wt 112 lb (50.8 kg)   LMP 03/09/2011   SpO2 100%   BMI 19.22 kg/m   Vital signs normal    Physical Exam  Constitutional: She is oriented to person, place, and time. She appears well-developed and well-nourished.  Non-toxic appearance. She does not appear ill. No distress.  asleep  HENT:  Head: Normocephalic and atraumatic.  Right Ear: External ear normal.  Left Ear: External ear normal.  Nose: Nose normal. No mucosal edema or rhinorrhea.  Mouth/Throat: Oropharynx is clear and moist and mucous membranes are normal. No dental abscesses or uvula swelling.  Eyes: Conjunctivae and EOM are normal. Pupils are equal, round, and reactive to light.  Neck: Normal range of motion and full passive range of motion without pain. Neck supple.  Cardiovascular: Normal rate, regular rhythm and normal heart sounds.  Exam reveals no gallop and no friction rub.   No murmur heard. Pulmonary/Chest: Effort normal and breath sounds normal. No respiratory distress. She has no wheezes. She has no rhonchi. She has no rales. She exhibits no tenderness and no crepitus.  Abdominal: Soft. Normal appearance and bowel sounds are normal. She exhibits no distension. There is tenderness. There is no rebound and no guarding.    Mildly tender RLQ.  Genitourinary:  Genitourinary Comments: Normal external genitalia, she has some frothy white discharge in the vault, her uterus is nontender to palpation and is normal size. She is not tender to palpation over either ovary and neither ovary is enlarged.  Musculoskeletal: Normal range of motion. She exhibits tenderness. She exhibits no edema.       Back:  Mildly tender right lower flank area.  Neurological: She is alert and oriented to person, place, and time. She has normal strength. No cranial nerve deficit.   Skin: Skin is warm, dry and intact. No rash noted. No erythema. No pallor.  Psychiatric: She has a normal mood and affect. Her speech is normal and behavior is normal. Her mood appears not anxious.  Nursing note and vitals reviewed.    ED Treatments / Results  DIAGNOSTIC STUDIES: Oxygen Saturation is 100% on RA, normal by my interpretation.     Labs (all labs ordered are listed, but only abnormal results are displayed) Results for orders placed or performed during the hospital encounter of 10/16/15  Wet prep, genital  Result Value Ref Range   Yeast Wet Prep HPF POC NONE SEEN NONE SEEN   Trich, Wet Prep NONE SEEN NONE SEEN   Clue Cells Wet Prep HPF POC PRESENT (A) NONE SEEN   WBC, Wet Prep HPF POC  NONE SEEN NONE SEEN   Sperm NONE SEEN   Lipase, blood  Result Value Ref Range   Lipase 32 11 - 51 U/L  Comprehensive metabolic panel  Result Value Ref Range   Sodium 135 135 - 145 mmol/L   Potassium 3.4 (L) 3.5 - 5.1 mmol/L   Chloride 105 101 - 111 mmol/L   CO2 22 22 - 32 mmol/L   Glucose, Bld 131 (H) 65 - 99 mg/dL   BUN 10 6 - 20 mg/dL   Creatinine, Ser 3.54 0.44 - 1.00 mg/dL   Calcium 9.0 8.9 - 65.6 mg/dL   Total Protein 8.0 6.5 - 8.1 g/dL   Albumin 4.1 3.5 - 5.0 g/dL   AST 16 15 - 41 U/L   ALT 11 (L) 14 - 54 U/L   Alkaline Phosphatase 72 38 - 126 U/L   Total Bilirubin 0.2 (L) 0.3 - 1.2 mg/dL   GFR calc non Af Amer >60 >60 mL/min   GFR calc Af Amer >60 >60 mL/min   Anion gap 8 5 - 15  CBC  Result Value Ref Range   WBC 10.7 (H) 4.0 - 10.5 K/uL   RBC 4.17 3.87 - 5.11 MIL/uL   Hemoglobin 13.5 12.0 - 15.0 g/dL   HCT 81.2 75.1 - 70.0 %   MCV 92.8 78.0 - 100.0 fL   MCH 32.4 26.0 - 34.0 pg   MCHC 34.9 30.0 - 36.0 g/dL   RDW 17.4 94.4 - 96.7 %   Platelets 320 150 - 400 K/uL  Urinalysis, Routine w reflex microscopic  Result Value Ref Range   Color, Urine YELLOW YELLOW   APPearance CLEAR CLEAR   Specific Gravity, Urine <1.005 (L) 1.005 - 1.030   pH 6.0 5.0 - 8.0    Glucose, UA NEGATIVE NEGATIVE mg/dL   Hgb urine dipstick NEGATIVE NEGATIVE   Bilirubin Urine NEGATIVE NEGATIVE   Ketones, ur NEGATIVE NEGATIVE mg/dL   Protein, ur NEGATIVE NEGATIVE mg/dL   Nitrite NEGATIVE NEGATIVE   Leukocytes, UA NEGATIVE NEGATIVE   Laboratory interpretation all normal except BV   Laboratory interpretation all normal except minimal leukocytosis, hypokalemia   EKG  EKG Interpretation None       Radiology Ct Renal Stone Study  Result Date: 10/17/2015 CLINICAL DATA:  Acute onset of right lower quadrant and right flank pain. Initial encounter. EXAM: CT ABDOMEN AND PELVIS WITHOUT CONTRAST TECHNIQUE: Multidetector CT imaging of the abdomen and pelvis was performed following the standard protocol without IV contrast. COMPARISON:  CT of the abdomen and pelvis, and pelvic ultrasound performed 07/17/2014 FINDINGS: Lower chest: Minimal tree-in-bud opacity is noted at the right middle lobe, raising concern for a mild atypical infectious process. The visualized portions of the mediastinum are unremarkable. Hepatobiliary: The liver is unremarkable in appearance. The gallbladder is decompressed and likely within normal limits. Minimal adjacent soft tissue inflammation is nonspecific. The common bile duct remains normal in caliber. Pancreas: The pancreas is within normal limits. Spleen: The spleen is unremarkable in appearance. Adrenals/Urinary Tract: The adrenal glands are unremarkable in appearance. The kidneys are within normal limits. There is no evidence of hydronephrosis. No renal or ureteral stones are identified. No perinephric stranding is seen. Stomach/Bowel: The stomach is unremarkable in appearance. The small bowel is within normal limits. The appendix is normal in caliber, without evidence of appendicitis. The colon is unremarkable in appearance. Vascular/Lymphatic: The abdominal aorta is unremarkable in appearance. The inferior vena cava is grossly unremarkable. No  retroperitoneal lymphadenopathy is seen. No  pelvic sidewall lymphadenopathy is identified. Reproductive: The bladder is mildly distended and grossly unremarkable. The patient is status post hysterectomy. There is question of mild soft tissue inflammation about the vaginal cuff. No suspicious adnexal masses are seen. Other: No additional soft tissue abnormalities are seen. Musculoskeletal: No acute osseous abnormalities are identified. The visualized musculature is unremarkable in appearance. IMPRESSION: 1. Question of mild soft tissue inflammation about the vaginal cuff. Would correlate for any evidence of pelvic inflammatory disease. 2. Minimal tree-in-bud opacity at the right middle lung lobe, concerning for a mild atypical infectious process. Electronically Signed   By: Roanna Raider M.D.   On: 10/17/2015 01:28    Procedures Procedures (including critical care time)  Medications Ordered in ED Medications  sodium chloride 0.9 % bolus 1,000 mL (0 mLs Intravenous Stopped 10/17/15 0326)  ketorolac (TORADOL) 30 MG/ML injection 30 mg (30 mg Intravenous Given 10/17/15 0032)     Initial Impression / Assessment and Plan / ED Course  I have reviewed the triage vital signs and the nursing notes.  Pertinent labs & imaging results that were available during my care of the patient were reviewed by me and considered in my medical decision making (see chart for details).  Clinical Course    COORDINATION OF CARE: 11:31 PM Discussed treatment plan with pt at bedside and pt agreed to plan. Renal CT scan ordered. She was given IV fluids and IV toradol for pain.   Every time I walked past the patient's room she is sound asleep. At time of discharge she states the Toradol helped her pain.   Final Clinical Impressions(s) / ED Diagnoses   Final diagnoses:  Pelvic pain in female  BV (bacterial vaginosis)     New Prescriptions New Prescriptions   CYCLOBENZAPRINE (FLEXERIL) 5 MG TABLET    Take 1 tablet  (5 mg total) by mouth 3 (three) times daily as needed (pain).   METRONIDAZOLE (FLAGYL) 500 MG TABLET    Take 1 tablet (500 mg total) by mouth 2 (two) times daily.   NAPROXEN (NAPROSYN) 250 MG TABLET    Take 1 tablet (250 mg total) by mouth 2 (two) times daily.   ONDANSETRON (ZOFRAN) 4 MG TABLET    Take 1 tablet (4 mg total) by mouth every 6 (six) hours.   Plan discharge  Devoria Albe, MD, FACEP   I personally performed the services described in this documentation, which was scribed in my presence. The recorded information has been reviewed and considered.  Devoria Albe, MD, Concha Pyo, MD 10/17/15 [phone]

## 2015-10-16 NOTE — ED Notes (Signed)
Dr. Knapp in with pt at this time 

## 2015-10-16 NOTE — ED Triage Notes (Signed)
RLQ pain that radiates to right flank since Friday. Also reports of pressure with urination. Last BM this morning. Denies fever.

## 2015-10-17 LAB — WET PREP, GENITAL
Sperm: NONE SEEN
TRICH WET PREP: NONE SEEN
WBC, Wet Prep HPF POC: NONE SEEN
Yeast Wet Prep HPF POC: NONE SEEN

## 2015-10-17 MED ORDER — NAPROXEN 250 MG PO TABS: 250.0000 mg | ORAL_TABLET | Freq: Two times a day (BID) | ORAL | 0 refills | Status: DC

## 2015-10-17 MED ORDER — CYCLOBENZAPRINE HCL 5 MG PO TABS: 5.0000 mg | ORAL_TABLET | Freq: Three times a day (TID) | ORAL | 0 refills | Status: DC | PRN

## 2015-10-17 MED ORDER — METRONIDAZOLE 500 MG PO TABS: 500.0000 mg | ORAL_TABLET | Freq: Two times a day (BID) | ORAL | 0 refills | Status: DC

## 2015-10-17 MED ORDER — KETOROLAC TROMETHAMINE 30 MG/ML IJ SOLN
30.0000 mg | Freq: Once | INTRAMUSCULAR | Status: AC
  Administered 2015-10-17: 30 mg via INTRAVENOUS
  Filled 2015-10-17: qty 1

## 2015-10-17 MED ORDER — ONDANSETRON HCL 4 MG PO TABS: 4.0000 mg | ORAL_TABLET | Freq: Four times a day (QID) | ORAL | 0 refills | Status: DC

## 2015-10-17 NOTE — Discharge Instructions (Signed)
You can take the naproxen and Flexeril for your pain. Take the antibiotics until gone. You have a benign vaginal infection called bacterial vaginosis. Please follow-up with your doctor if you aren't improving in the next several days. Return to the emergency department if you get fever or have uncontrolled vomiting.

## 2015-10-17 NOTE — ED Notes (Signed)
Went in to check on pt and pt resting with eyes closed, called out to patient and no response

## 2015-10-18 LAB — HIV ANTIBODY (ROUTINE TESTING W REFLEX): HIV SCREEN 4TH GENERATION: NONREACTIVE

## 2015-10-18 LAB — GC/CHLAMYDIA PROBE AMP (~~LOC~~) NOT AT ARMC
Chlamydia: NEGATIVE
NEISSERIA GONORRHEA: NEGATIVE

## 2016-01-18 ENCOUNTER — Encounter (HOSPITAL_COMMUNITY): Payer: Self-pay | Admitting: *Deleted

## 2016-01-18 ENCOUNTER — Emergency Department (HOSPITAL_COMMUNITY)
Admission: EM | Admit: 2016-01-18 | Discharge: 2016-01-18 | Disposition: A | Discharge status: Home / Self Care | Payer: Self-pay | Attending: Emergency Medicine | Admitting: Emergency Medicine

## 2016-01-18 ENCOUNTER — Emergency Department (HOSPITAL_COMMUNITY): Payer: Self-pay

## 2016-01-18 DIAGNOSIS — R079 Chest pain, unspecified: Secondary | ICD-10-CM

## 2016-01-18 DIAGNOSIS — J069 Acute upper respiratory infection, unspecified: Secondary | ICD-10-CM | POA: Insufficient documentation

## 2016-01-18 DIAGNOSIS — Z79899 Other long term (current) drug therapy: Secondary | ICD-10-CM | POA: Insufficient documentation

## 2016-01-18 DIAGNOSIS — F419 Anxiety disorder, unspecified: Secondary | ICD-10-CM | POA: Insufficient documentation

## 2016-01-18 DIAGNOSIS — F17210 Nicotine dependence, cigarettes, uncomplicated: Secondary | ICD-10-CM | POA: Insufficient documentation

## 2016-01-18 DIAGNOSIS — Z7982 Long term (current) use of aspirin: Secondary | ICD-10-CM | POA: Insufficient documentation

## 2016-01-18 LAB — I-STAT CHEM 8, ED
BUN: 4 mg/dL — ABNORMAL LOW (ref 6–20)
CALCIUM ION: 1.22 mmol/L (ref 1.15–1.40)
Chloride: 107 mmol/L (ref 101–111)
Creatinine, Ser: 0.6 mg/dL (ref 0.44–1.00)
GLUCOSE: 83 mg/dL (ref 65–99)
HCT: 40 % (ref 36.0–46.0)
HEMOGLOBIN: 13.6 g/dL (ref 12.0–15.0)
Potassium: 4.1 mmol/L (ref 3.5–5.1)
SODIUM: 142 mmol/L (ref 135–145)
TCO2: 25 mmol/L (ref 0–100)

## 2016-01-18 LAB — I-STAT TROPONIN, ED: TROPONIN I, POC: 0 ng/mL (ref 0.00–0.08)

## 2016-01-18 LAB — D-DIMER, QUANTITATIVE: D-Dimer, Quant: 2 ug/mL-FEU — ABNORMAL HIGH (ref 0.00–0.50)

## 2016-01-18 MED ORDER — AZITHROMYCIN 250 MG PO TABS
500.0000 mg | ORAL_TABLET | Freq: Once | ORAL | Status: AC
  Administered 2016-01-18: 500 mg via ORAL
  Filled 2016-01-18: qty 2

## 2016-01-18 MED ORDER — ACETAMINOPHEN 500 MG PO TABS
1000.0000 mg | ORAL_TABLET | Freq: Once | ORAL | Status: AC
  Administered 2016-01-18: 1000 mg via ORAL
  Filled 2016-01-18: qty 2

## 2016-01-18 MED ORDER — IOPAMIDOL (ISOVUE-370) INJECTION 76%
100.0000 mL | Freq: Once | INTRAVENOUS | Status: AC | PRN
  Administered 2016-01-18: 100 mL via INTRAVENOUS

## 2016-01-18 MED ORDER — ALPRAZOLAM 0.5 MG PO TABS
1.0000 mg | ORAL_TABLET | Freq: Once | ORAL | Status: AC
  Administered 2016-01-18: 1 mg via ORAL
  Filled 2016-01-18: qty 2

## 2016-01-18 MED ORDER — SODIUM CHLORIDE 0.9 % IV BOLUS (SEPSIS)
500.0000 mL | Freq: Once | INTRAVENOUS | Status: AC
  Administered 2016-01-18: 500 mL via INTRAVENOUS

## 2016-01-18 MED ORDER — AZITHROMYCIN 250 MG PO TABS: 250.0000 mg | ORAL_TABLET | Freq: Every day | ORAL | 0 refills | Status: DC

## 2016-01-18 MED ORDER — ALBUTEROL SULFATE HFA 108 (90 BASE) MCG/ACT IN AERS
2.0000 | INHALATION_SPRAY | Freq: Once | RESPIRATORY_TRACT | Status: AC
  Administered 2016-01-18: 2 via RESPIRATORY_TRACT
  Filled 2016-01-18: qty 6.7

## 2016-01-18 NOTE — Discharge Instructions (Signed)

## 2016-01-18 NOTE — ED Provider Notes (Signed)
Emergency Department Provider Note   I have reviewed the triage vital signs and the nursing notes.   HISTORY  Chief Complaint Anxiety   HPI Linda Calderon is a 40 y.o. female with PMH of PTSD, ADD, and chronic back pain presents to the emergency department for evaluation of worsening anxiety, headache, cough for 2 weeks, and chest pain. The patient states that she's been coughing for 2 weeks with mild runny nose. She denies fever. Her son has similar symptoms. Patient states that her anxiety has been worsening significantly over the past several days because she and her husband lost their jobs that she's been caring for her ailing father. She reports chest pain that began this morning. She describes in the center of her chest and nonradiating. Denies pleuritic pain. Not worse with exertion. No associated dyspnea. She denies any unilateral leg swelling. No recent surgeries or prolonged periods of inactivity.  She also endorses a mild, frontal headache. Did not begin suddenly or with maximum intensity. No fever or neck pain.    Past Medical History:  Diagnosis Date  . ADD (attention deficit disorder)   . Chronic neck and back pain   . Pneumonia   . PTSD (post-traumatic stress disorder)   . Radiculopathy   . UTI (lower urinary tract infection)     There are no active problems to display for this patient.   Past Surgical History:  Procedure Laterality Date  . ABDOMINAL HYSTERECTOMY    . TUBAL LIGATION      Current Outpatient Rx  . Order #: [id number] Class: Historical Med  . Order #: [id number] Class: Historical Med  . Order #: [id number] Class: Historical Med  . Order #: [id number] Class: Historical Med  . Order #: [id number] Class: Print  . Order #: [id number] Class: Print  . Order #: [id number] Class: Print  . Order #: [id number] Class: Print  . Order #: [id number] Class: Print    Allergies Morphine and related  Family History  Problem Relation Age of Onset  . Hypertension Mother    . Hypertension Father   . Cancer Father     Social History Social History  Substance Use Topics  . Smoking status: Current Every Day Smoker    Packs/day: 1.00    Years: 10.00    Types: Cigarettes  . Smokeless tobacco: Never Used  . Alcohol use No    Review of Systems  Constitutional: No fever/chills Eyes: No visual changes. ENT: No sore throat. Cardiovascular: Positive chest pain. Respiratory: Denies shortness of breath. Gastrointestinal: No abdominal pain.  No nausea, no vomiting.  No diarrhea.  No constipation. Genitourinary: Negative for dysuria. Musculoskeletal: Negative for back pain. Skin: Negative for rash. Neurological: Negative for focal weakness or numbness. Positive mild HA.   10-point ROS otherwise negative.  ____________________________________________   PHYSICAL EXAM:  VITAL SIGNS: ED Triage Vitals  Enc Vitals Group     BP 01/18/16 1130 113/90     Pulse Rate 01/18/16 1130 100     Resp 01/18/16 1130 20     Temp 01/18/16 1130 97.6 F (36.4 C)     SpO2 01/18/16 1130 100 %     Weight 01/18/16 1131 112 lb (50.8 kg)     Pain Score 01/18/16 1131 6   Constitutional: Alert and oriented. Well appearing and in no acute distress. Eyes: Conjunctivae are normal.  Head: Atraumatic.  Nose: No congestion/rhinnorhea. Mouth/Throat: Mucous membranes are moist.  Neck: No stridor.  No meningeal signs.  Cardiovascular: Normal rate, regular rhythm. Good peripheral circulation. Grossly normal  heart sounds.   Respiratory: Normal respiratory effort.  No retractions. Lungs CTAB. Gastrointestinal: Soft and nontender. No distention.  Musculoskeletal: No lower extremity tenderness nor edema. No gross deformities of extremities. Neurologic:  Normal speech and language. No gross focal neurologic deficits are appreciated.  Skin:  Skin is warm, dry and intact. No rash noted. Psychiatric: Mood and affect are normal. Speech and behavior are  normal.  ____________________________________________   LABS (all labs ordered are listed, but only abnormal results are displayed)  Labs Reviewed  D-DIMER, QUANTITATIVE (NOT AT [facility]) - Abnormal; Notable for the following:       Result Value   D-Dimer, Quant 2.00 (*)    All other components within normal limits  I-STAT CHEM 8, ED - Abnormal; Notable for the following:    BUN 4 (*)    All other components within normal limits  I-STAT TROPOININ, ED   ____________________________________________  EKG   EKG Interpretation  Date/Time:  Saturday January 18 2016 11:36:15 EST Ventricular Rate:  98 PR Interval:  130 QRS Duration: 80 QT Interval:  332 QTC Calculation: 423 R Axis:   90 Text Interpretation:  Normal sinus rhythm Right atrial enlargement Rightward axis Nonspecific ST and T wave abnormality Abnormal ECG No STEMI.  Confirmed by Savas Elvin MD, Denina Rieger [phone]) on 01/18/2016 11:51:10 AM       ____________________________________________  RADIOLOGY  Dg Chest 2 View  Result Date: 01/18/2016 CLINICAL DATA:  CHEST PAIN, Pt is here for chest pressure and HA which has been going on since she and her husband were both fired from their jobs Tuesday. Pt states that she has been under a lot of pressure lately and feels very anxious. EXAM: CHEST  2 VIEW COMPARISON:  10/16/2014 FINDINGS: Lungs are mildly hyperinflated. There is mild perihilar peribronchial thickening. No focal consolidations, pleural effusions, or pulmonary edema. IMPRESSION: 1. Mild hyperinflation and bronchitic changes. 2.  No focal acute pulmonary abnormality. Electronically Signed   By: Norva Pavlov M.D.   On: 01/18/2016 13:11   Ct Angio Chest Pe W And/or Wo Contrast  Result Date: 01/18/2016 CLINICAL DATA:  Chest pressure and anxiety for 4 days EXAM: CT ANGIOGRAPHY CHEST WITH CONTRAST TECHNIQUE: Multidetector CT imaging of the chest was performed using the standard protocol during bolus administration of  intravenous contrast. Multiplanar CT image reconstructions and MIPs were obtained to evaluate the vascular anatomy. CONTRAST:  100 mL Isovue 370 COMPARISON:  Chest x-ray January 18, 2016 FINDINGS: Cardiovascular: Satisfactory opacification of the pulmonary arteries to the segmental level. No evidence of pulmonary embolism. Normal heart size. No pericardial effusion. Mediastinum/Nodes: No enlarged mediastinal, hilar, or axillary lymph nodes. Thyroid gland, trachea, and esophagus demonstrate no significant findings. Lungs/Pleura: There is increased interstitial markings with associated small nodularity in the right middle lobe. There is no pleural effusion or pulmonary edema. Upper Abdomen: No acute abnormality. Musculoskeletal: No chest wall abnormality. No acute or significant osseous findings. Review of the MIP images confirms the above findings. IMPRESSION: No pulmonary embolus. Increased interstitial markings with associated small nodularity in the right middle lobe favor infectious/ inflammatory etiology. Electronically Signed   By: Sherian Rein M.D.   On: 01/18/2016 14:20    ____________________________________________   PROCEDURES  Procedure(s) performed:   Procedures  None ____________________________________________   INITIAL IMPRESSION / ASSESSMENT AND PLAN / ED COURSE  Pertinent labs & imaging results that were available during my care of the patient were reviewed by me and considered in my medical decision making (see chart for  details).  Patient resents to the emergency department for evaluation of multiple medical complaints including anxiety, chest pain, headache, and 2 weeks of cough. She has known sick contacts with child URI symptoms. For anxiety she is been followed closely by psychiatry and is been taking her Xanax 3 times a day with little improvement. She denies any suicidal or homicidal ideation. She feels safe at home. Discussed in terms of the anxiety we will give Xanax  here and have her follow up with her psychiatrist on Monday as previously scheduled.   As per her chest pain the patient has few other medical comorbidities to increase her risk for ACS. Triage EKG unremarkable for acute ischemia. Patient has a low pretest probability for PE by [facility]. Unable to [facility] negative because of triage HR of 100. Plan for d-dimer and reassess after Xanax and CXR with 2 weeks of cough.   D-dimer elevated. CTA negative for PE with possible developing infection. Will discharge home with Azithromycin and albuterol inh. Patient will follow up with the psychiatrist next week.  At this time, I do not feel there is any life-threatening condition present. I have reviewed and discussed all results (EKG, imaging, lab, urine as appropriate), exam findings with patient. I have reviewed nursing notes and appropriate previous records.  I feel the patient is safe to be discharged home without further emergent workup. Discussed usual and customary return precautions. Patient and family (if present) verbalize understanding and are comfortable with this plan.  Patient will follow-up with their primary care provider. If they do not have a primary care provider, information for follow-up has been provided to them. All questions have been answered.   ____________________________________________  FINAL CLINICAL IMPRESSION(S) / ED DIAGNOSES  Final diagnoses:  Chest pain, unspecified type  Anxiety  Upper respiratory tract infection, unspecified type     MEDICATIONS GIVEN DURING THIS VISIT:  Medications  ALPRAZolam (XANAX) tablet 1 mg (1 mg Oral Given 01/18/16 1241)  acetaminophen (TYLENOL) tablet 1,000 mg (1,000 mg Oral Given 01/18/16 1329)  sodium chloride 0.9 % bolus 500 mL (0 mLs Intravenous Stopped 01/18/16 1502)  iopamidol (ISOVUE-370) 76 % injection 100 mL (100 mLs Intravenous Contrast Given 01/18/16 1356)  albuterol (PROVENTIL HFA;VENTOLIN HFA) 108 (90 Base) MCG/ACT inhaler 2 puff (2  puffs Inhalation Given 01/18/16 1452)  azithromycin (ZITHROMAX) tablet 500 mg (500 mg Oral Given 01/18/16 1452)     NEW OUTPATIENT MEDICATIONS STARTED DURING THIS VISIT:  Discharge Medication List as of 01/18/2016  2:45 PM    START taking these medications   Details  azithromycin (ZITHROMAX) 250 MG tablet Take 1 tablet (250 mg total) by mouth daily. Take first 2 tablets together, then 1 every day until finished., Starting Sat 01/18/2016, Print         Note:  This document was prepared using Dragon voice recognition software and may include unintentional dictation errors.  Alona Bene, MD Emergency Medicine   Maia Plan, MD 01/19/16 [phone]

## 2016-01-18 NOTE — ED Triage Notes (Signed)
Pt is here for chest pressure and HA which has been going on since she and her husband were both fired from their jobs Tuesday.  Pt states that she has been under a lot of pressure lately and feels very anxious about the job loss in addition to helping her father who is undergoing cancer treatment.

## 2016-02-27 ENCOUNTER — Emergency Department: Payer: Medicaid Other

## 2016-02-27 ENCOUNTER — Encounter: Payer: Self-pay | Admitting: Emergency Medicine

## 2016-02-27 DIAGNOSIS — R0789 Other chest pain: Secondary | ICD-10-CM | POA: Insufficient documentation

## 2016-02-27 DIAGNOSIS — R112 Nausea with vomiting, unspecified: Secondary | ICD-10-CM | POA: Insufficient documentation

## 2016-02-27 DIAGNOSIS — R0602 Shortness of breath: Secondary | ICD-10-CM | POA: Insufficient documentation

## 2016-02-27 DIAGNOSIS — Z79899 Other long term (current) drug therapy: Secondary | ICD-10-CM | POA: Insufficient documentation

## 2016-02-27 DIAGNOSIS — F17210 Nicotine dependence, cigarettes, uncomplicated: Secondary | ICD-10-CM | POA: Insufficient documentation

## 2016-02-27 LAB — BASIC METABOLIC PANEL
Anion gap: 8 (ref 5–15)
BUN: 8 mg/dL (ref 6–20)
CHLORIDE: 109 mmol/L (ref 101–111)
CO2: 20 mmol/L — AB (ref 22–32)
Calcium: 8.9 mg/dL (ref 8.9–10.3)
Creatinine, Ser: 0.76 mg/dL (ref 0.44–1.00)
GFR calc Af Amer: >60 mL/min (ref >60)
GFR calc non Af Amer: >60 mL/min (ref >60)
GLUCOSE: 114 mg/dL — AB (ref 65–99)
Potassium: 3.8 mmol/L (ref 3.5–5.1)
Sodium: 137 mmol/L (ref 135–145)

## 2016-02-27 LAB — CBC
HCT: 38.9 % (ref 35.0–47.0)
Hemoglobin: 13.8 g/dL (ref 12.0–16.0)
MCH: 32.6 pg (ref 26.0–34.0)
MCHC: 35.6 g/dL (ref 32.0–36.0)
MCV: 91.7 fL (ref 80.0–100.0)
PLATELETS: 356 10*3/uL (ref 150–440)
RBC: 4.24 MIL/uL (ref 3.80–5.20)
RDW: 13.2 % (ref 11.5–14.5)
WBC: 11.3 10*3/uL — ABNORMAL HIGH (ref 3.6–11.0)

## 2016-02-27 LAB — TROPONIN I

## 2016-02-27 NOTE — ED Triage Notes (Signed)
Pt ambulatory to triage with steady gait, no distress noted. Pt c/o left sided chest pain that started last night, worsened throughout today. Pt reports she was seen at [facility] x1 month ago for similar, diagnosed with anxiety, given new meds. Pt reports she lost "her best friend" on Saturday and has been stressed since.

## 2016-02-28 ENCOUNTER — Emergency Department
Admission: EM | Admit: 2016-02-28 | Discharge: 2016-02-28 | Disposition: A | Discharge status: Home / Self Care | Payer: Medicaid Other | Attending: Emergency Medicine | Admitting: Emergency Medicine

## 2016-02-28 DIAGNOSIS — R0789 Other chest pain: Secondary | ICD-10-CM

## 2016-02-28 DIAGNOSIS — R079 Chest pain, unspecified: Secondary | ICD-10-CM

## 2016-02-28 MED ORDER — IBUPROFEN 600 MG PO TABS: 600.0000 mg | ORAL_TABLET | Freq: Three times a day (TID) | ORAL | 0 refills | Status: DC | PRN

## 2016-02-28 MED ORDER — KETOROLAC TROMETHAMINE 30 MG/ML IJ SOLN
30.0000 mg | Freq: Once | INTRAMUSCULAR | Status: DC
  Filled 2016-02-28: qty 1

## 2016-02-28 MED ORDER — HYDROCODONE-ACETAMINOPHEN 5-325 MG PO TABS
1.0000 | ORAL_TABLET | Freq: Once | ORAL | Status: AC
  Administered 2016-02-28: 1 via ORAL
  Filled 2016-02-28: qty 1

## 2016-02-28 MED ORDER — HYDROCODONE-ACETAMINOPHEN 5-325 MG PO TABS: 1.0000 | ORAL_TABLET | Freq: Four times a day (QID) | ORAL | 0 refills | Status: DC | PRN

## 2016-02-28 NOTE — ED Provider Notes (Signed)
[facility] Emergency Department Provider Note   ____________________________________________   First MD Initiated Contact with Patient 02/28/16 0309     (approximate)  I have reviewed the triage vital signs and the nursing notes.   HISTORY  Chief Complaint Chest Pain    HPI Linda Calderon is a 41 y.o. female who presents to the ED from home with a chief complain of chest pain. Patient reports a one-day history of constant sharp pain underneath her left breast. Reports symptoms associated with nausea, vomiting and shortness of breath. Similar symptoms approximately 1 month ago; she was evaluated at [facility] ER and diagnosed with anxiety, placed on Xanax. Reports recent loss of her best friends this week and reports increased anxiety since. 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 Denies recent fever, chills, cough, congestion, abdominal pain, diarrhea. Denies recent travel, trauma or OCP use. Applying a pillow to her chest makes her pain better, movement makes her pain worse.   Past Medical History:  Diagnosis Date  . ADD (attention deficit disorder)   . Chronic neck and back pain   . Pneumonia   . PTSD (post-traumatic stress disorder)   . Radiculopathy   . UTI (lower urinary tract infection)     There are no active problems to display for this patient.   Past Surgical History:  Procedure Laterality Date  . ABDOMINAL HYSTERECTOMY    . TUBAL LIGATION      Prior to Admission medications   Medication Sig Start Date End Date Taking? Authorizing Provider  ALPRAZolam Prudy Feeler) 1 MG tablet Take 1 mg by mouth 3 (three) times daily.    Historical Provider, MD  amphetamine-dextroamphetamine (ADDERALL) 20 MG tablet Take 20 mg by mouth every morning.     Historical Provider, MD  Aspirin-Acetaminophen-Caffeine (GOODY HEADACHE PO) Take 1 Package by mouth daily as needed (for pain).    Historical Provider, MD    azithromycin (ZITHROMAX) 250 MG tablet Take 1 tablet (250 mg total) by mouth daily. Take first 2 tablets together, then 1 every day until finished. 01/18/16   Maia Plan, MD  cyclobenzaprine (FLEXERIL) 5 MG tablet Take 1 tablet (5 mg total) by mouth 3 (three) times daily as needed (pain). 10/17/15   Devoria Albe, MD  HYDROcodone-acetaminophen (NORCO) 5-325 MG tablet Take 1 tablet by mouth every 6 (six) hours as needed for moderate pain. 02/28/16   Irean Hong, MD  ibuprofen (ADVIL,MOTRIN) 600 MG tablet Take 1 tablet (600 mg total) by mouth every 8 (eight) hours as needed. 02/28/16   Irean Hong, MD  metroNIDAZOLE (FLAGYL) 500 MG tablet Take 1 tablet (500 mg total) by mouth 2 (two) times daily. 10/17/15   Devoria Albe, MD  mirtazapine (REMERON) 45 MG tablet Take 45 mg by mouth at bedtime.    Historical Provider, MD  naproxen (NAPROSYN) 250 MG tablet Take 1 tablet (250 mg total) by mouth 2 (two) times daily. 10/17/15   Devoria Albe, MD  ondansetron (ZOFRAN) 4 MG tablet Take 1 tablet (4 mg total) by mouth every 6 (six) hours. 10/17/15   Devoria Albe, MD    Allergies Morphine and related  Family History  Problem Relation Age of Onset  . Hypertension Mother   . Hypertension Father   . Cancer Father     Social History Social History  Substance Use Topics  . Smoking status: Current Every Day Smoker    Packs/day: 1.00  Years: 10.00    Types: Cigarettes  . Smokeless tobacco: Never Used  . Alcohol use No    Review of Systems  Constitutional: No fever/chills. Eyes: No visual changes. ENT: No sore throat. Cardiovascular: Positive for chest pain. Respiratory: Positive for shortness of breath. Gastrointestinal: No abdominal pain.  Positive for nausea and vomiting.  No diarrhea.  No constipation. Genitourinary: Negative for dysuria. Musculoskeletal: Negative for back pain. Skin: Negative for rash. Neurological: Negative for headaches, focal weakness or numbness.  10-point ROS otherwise  negative.  ____________________________________________   PHYSICAL EXAM:  VITAL SIGNS: ED Triage Vitals  Enc Vitals Group     BP 02/27/16 2317 106/77     Pulse Rate 02/27/16 2317 89     Resp 02/27/16 2317 15     Temp 02/27/16 2317 98 F (36.7 C)     Temp Source 02/27/16 2317 Oral     SpO2 02/27/16 2317 99 %     Weight 02/27/16 2318 112 lb (50.8 kg)     Height 02/27/16 2318 5\' 4"  (1.626 m)     Head Circumference --      Peak Flow --      Pain Score 02/28/16 0243 8     Pain Loc --      Pain Edu? --      Excl. in GC? --     Constitutional: Alert and oriented. Well appearing and in no acute distress. Eyes: Conjunctivae are normal. PERRL. EOMI. Head: Atraumatic. Nose: No congestion/rhinnorhea. Mouth/Throat: Mucous membranes are moist.  Oropharynx non-erythematous. Neck: No stridor.   Cardiovascular: Normal rate, regular rhythm. Grossly normal heart sounds.  Good peripheral circulation. Respiratory: Normal respiratory effort.  No retractions. Lungs CTAB. Left anterior chest wall tender to palpation and with movement of trunk. Gastrointestinal: Soft and nontender. No distention. No abdominal bruits. No CVA tenderness. Musculoskeletal: No lower extremity tenderness nor edema.  No joint effusions. Neurologic:  Normal speech and language. No gross focal neurologic deficits are appreciated. No gait instability. Skin:  Skin is warm, dry and intact. No rash noted. Psychiatric: Mood and affect are normal. Speech and behavior are normal.  ____________________________________________   LABS (all labs ordered are listed, but only abnormal results are displayed)  Labs Reviewed  BASIC METABOLIC PANEL - Abnormal; Notable for the following:       Result Value   CO2 20 (*)    Glucose, Bld 114 (*)    All other components within normal limits  CBC - Abnormal; Notable for the following:    WBC 11.3 (*)    All other components within normal limits  TROPONIN I    ____________________________________________  EKG  ED ECG REPORT I, SUNG,JADE J, the attending physician, personally viewed and interpreted this ECG.   Date: 02/28/2016  EKG Time: 2326  Rate: 87  Rhythm: normal EKG, normal sinus rhythm  Axis: Normal  Intervals:none  ST&T Change: Nonspecific  ____________________________________________  RADIOLOGY  Chest 2 view interpreted per Dr. 2327: Hyperinflation. No acute infiltrate or edema ____________________________________________   PROCEDURES  Procedure(s) performed: None  Procedures  Critical Care performed: No  ____________________________________________   INITIAL IMPRESSION / ASSESSMENT AND PLAN / ED COURSE  Pertinent labs & imaging results that were available during my care of the patient were reviewed by me and considered in my medical decision making (see chart for details).  41 year old female who presents with reproducible chest pain; recent diagnosis of anxiety. Patient took a Xanax prior to arrival and reports improvement in her chest  pain. Low suspicion for ACS, PE, dissection. Laboratory results including troponin and EKG are unremarkable. Will treat with NSAID, analgesics and she will follow up with her PCP next week. Strict return precautions given. Patient verbalizes understanding and agrees with plan of care.      ____________________________________________   FINAL CLINICAL IMPRESSION(S) / ED DIAGNOSES  Final diagnoses:  Chest pain, unspecified type  Chest wall pain      NEW MEDICATIONS STARTED DURING THIS VISIT:  New Prescriptions   HYDROCODONE-ACETAMINOPHEN (NORCO) 5-325 MG TABLET    Take 1 tablet by mouth every 6 (six) hours as needed for moderate pain.   IBUPROFEN (ADVIL,MOTRIN) 600 MG TABLET    Take 1 tablet (600 mg total) by mouth every 8 (eight) hours as needed.     Note:  This document was prepared using Dragon voice recognition software and may include unintentional  dictation errors.    Irean Hong, MD 02/28/16 [phone]

## 2016-02-28 NOTE — ED Notes (Addendum)
Pt stating that she began with CP yesterday. Pt is pointing to left of sternum right under her left breast as the source of her CP . Pt stating that she has had n/v and SOB with her CP . Pt stating that she had a similar episode about a month ago and she was told that she had anxiety. Pt stating that she took 1mg  of Xanax around 2000 to see if it helped the pain. Pt stating that she feels like she has had some palpitations. Pt stating anterior HA that started around 1700 today. Pt does not appear to have any neuro deficits. Pt has been placed on cardiac monitor and pulse o2. VS WNL. Pt is not SOB at rest, just with movement. Pain is worse when she is moving.

## 2016-02-28 NOTE — Discharge Instructions (Signed)
1.  You may take pain medicines as needed (Motrin/Norco #15). 2.  Apply moist heat to affected area several times daily. 3.  Return to the ER for worsening symptoms, persistent vomiting, difficulty breathing or other concerns. 

## 2016-04-28 ENCOUNTER — Emergency Department: Payer: Medicaid Other

## 2016-04-28 ENCOUNTER — Encounter: Payer: Self-pay | Admitting: Emergency Medicine

## 2016-04-28 ENCOUNTER — Emergency Department
Admission: EM | Admit: 2016-04-28 | Discharge: 2016-04-28 | Disposition: A | Discharge status: Home / Self Care | Payer: Medicaid Other | Attending: Emergency Medicine | Admitting: Emergency Medicine

## 2016-04-28 DIAGNOSIS — Z791 Long term (current) use of non-steroidal anti-inflammatories (NSAID): Secondary | ICD-10-CM | POA: Diagnosis not present

## 2016-04-28 DIAGNOSIS — R42 Dizziness and giddiness: Secondary | ICD-10-CM | POA: Diagnosis not present

## 2016-04-28 DIAGNOSIS — F17210 Nicotine dependence, cigarettes, uncomplicated: Secondary | ICD-10-CM | POA: Insufficient documentation

## 2016-04-28 DIAGNOSIS — Z79899 Other long term (current) drug therapy: Secondary | ICD-10-CM | POA: Diagnosis not present

## 2016-04-28 DIAGNOSIS — R0789 Other chest pain: Secondary | ICD-10-CM | POA: Insufficient documentation

## 2016-04-28 LAB — CBC
HEMATOCRIT: 43.5 % (ref 35.0–47.0)
Hemoglobin: 14.7 g/dL (ref 12.0–16.0)
MCH: 31.6 pg (ref 26.0–34.0)
MCHC: 33.7 g/dL (ref 32.0–36.0)
MCV: 93.9 fL (ref 80.0–100.0)
Platelets: 366 10*3/uL (ref 150–440)
RBC: 4.63 MIL/uL (ref 3.80–5.20)
RDW: 13.2 % (ref 11.5–14.5)
WBC: 9.8 10*3/uL (ref 3.6–11.0)

## 2016-04-28 LAB — BASIC METABOLIC PANEL
Anion gap: 8 (ref 5–15)
BUN: 9 mg/dL (ref 6–20)
CALCIUM: 9.1 mg/dL (ref 8.9–10.3)
CO2: 23 mmol/L (ref 22–32)
Chloride: 106 mmol/L (ref 101–111)
Creatinine, Ser: 0.8 mg/dL (ref 0.44–1.00)
GFR calc Af Amer: >60 mL/min (ref >60)
Glucose, Bld: 77 mg/dL (ref 65–99)
POTASSIUM: 3.5 mmol/L (ref 3.5–5.1)
SODIUM: 137 mmol/L (ref 135–145)

## 2016-04-28 LAB — POCT PREGNANCY, URINE: PREG TEST UR: NEGATIVE

## 2016-04-28 LAB — TROPONIN I

## 2016-04-28 MED ORDER — GI COCKTAIL ~~LOC~~
30.0000 mL | Freq: Once | ORAL | Status: AC
  Administered 2016-04-28: 30 mL via ORAL

## 2016-04-28 MED ORDER — GI COCKTAIL ~~LOC~~
ORAL | Status: AC
  Administered 2016-04-28: 30 mL via ORAL
  Filled 2016-04-28: qty 30

## 2016-04-28 NOTE — ED Provider Notes (Signed)
[facility] Emergency Department Provider Note   ____________________________________________    I have reviewed the triage vital signs and the nursing notes.   HISTORY  Chief Complaint Dizziness and Chest Pain     HPI Linda Calderon is a 41 y.o. female who presents with complaints of dizziness and mild chest discomfort. Patient reports chest discomfort is described as tightness and started yesterday evening but has now resolved. She reports she has been under significant stress. She reports she takes propranolol for rapid heart rate. She denies short of breath. No fevers or chills. No recent travel. No calf pain. She also describes nausea vomiting and diarrhea with some dizziness today   Past Medical History:  Diagnosis Date  . ADD (attention deficit disorder)   . Chronic neck and back pain   . Pneumonia   . PTSD (post-traumatic stress disorder)   . Radiculopathy   . UTI (lower urinary tract infection)     There are no active problems to display for this patient.   Past Surgical History:  Procedure Laterality Date  . ABDOMINAL HYSTERECTOMY    . TUBAL LIGATION      Prior to Admission medications   Medication Sig Start Date End Date Taking? Authorizing Provider  ALPRAZolam Prudy Feeler) 1 MG tablet Take 1 mg by mouth 3 (three) times daily.    Historical Provider, MD  amphetamine-dextroamphetamine (ADDERALL) 20 MG tablet Take 20 mg by mouth every morning.     Historical Provider, MD  Aspirin-Acetaminophen-Caffeine (GOODY HEADACHE PO) Take 1 Package by mouth daily as needed (for pain).    Historical Provider, MD  azithromycin (ZITHROMAX) 250 MG tablet Take 1 tablet (250 mg total) by mouth daily. Take first 2 tablets together, then 1 every day until finished. 01/18/16   Maia Plan, MD  cyclobenzaprine (FLEXERIL) 5 MG tablet Take 1 tablet (5 mg total) by mouth 3 (three) times daily as needed (pain). 10/17/15   Devoria Albe, MD  HYDROcodone-acetaminophen  (NORCO) 5-325 MG tablet Take 1 tablet by mouth every 6 (six) hours as needed for moderate pain. 02/28/16   Irean Hong, MD  ibuprofen (ADVIL,MOTRIN) 600 MG tablet Take 1 tablet (600 mg total) by mouth every 8 (eight) hours as needed. 02/28/16   Irean Hong, MD  metroNIDAZOLE (FLAGYL) 500 MG tablet Take 1 tablet (500 mg total) by mouth 2 (two) times daily. 10/17/15   Devoria Albe, MD  mirtazapine (REMERON) 45 MG tablet Take 45 mg by mouth at bedtime.    Historical Provider, MD  naproxen (NAPROSYN) 250 MG tablet Take 1 tablet (250 mg total) by mouth 2 (two) times daily. 10/17/15   Devoria Albe, MD  ondansetron (ZOFRAN) 4 MG tablet Take 1 tablet (4 mg total) by mouth every 6 (six) hours. 10/17/15   Devoria Albe, MD     Allergies Morphine and related  Family History  Problem Relation Age of Onset  . Hypertension Mother   . Hypertension Father   . Cancer Father     Social History Social History  Substance Use Topics  . Smoking status: Current Every Day Smoker    Packs/day: 1.00    Years: 10.00    Types: Cigarettes  . Smokeless tobacco: Never Used  . Alcohol use No    Review of Systems  Constitutional: No fever/chills, Dizziness as above Eyes: No visual changes.  ENT: No sore throat. Cardiovascular: As above Respiratory: Denies shortness of breath. Gastrointestinal: As above  Genitourinary: Negative for dysuria.  Musculoskeletal: Negative for back pain. Skin: Negative for rash. Neurological: Negative for headaches  10-point ROS otherwise negative.  ____________________________________________   PHYSICAL EXAM:  VITAL SIGNS: ED Triage Vitals  Enc Vitals Group     BP 04/28/16 0845 100/67     Pulse Rate 04/28/16 0845 72     Resp 04/28/16 0845 20     Temp 04/28/16 0845 97.7 F (36.5 C)     Temp Source 04/28/16 0845 Oral     SpO2 04/28/16 0845 100 %     Weight 04/28/16 0845 113 lb (51.3 kg)     Height 04/28/16 0845 5\' 4"  (1.626 m)     Head Circumference --      Peak Flow --       Pain Score 04/28/16 0906 6     Pain Loc --      Pain Edu? --      Excl. in GC? --     Constitutional: Alert and oriented. No acute distress. Pleasant and interactive Eyes: Conjunctivae are normal.   Nose: No congestion/rhinnorhea. Mouth/Throat: Mucous membranes are moist.    Cardiovascular: Normal rate, regular rhythm. Grossly normal heart sounds.  Good peripheral circulation. Respiratory: Normal respiratory effort.  No retractions. Lungs CTAB. Gastrointestinal: Soft and nontender. No distention.  No CVA tenderness. Genitourinary: deferred Musculoskeletal: No lower extremity tenderness nor edema.  Warm and well perfused Neurologic:  Normal speech and language. No gross focal neurologic deficits are appreciated.  Skin:  Skin is warm, dry and intact. No rash noted. Psychiatric: Mood and affect are normal. Speech and behavior are normal.  ____________________________________________   LABS (all labs ordered are listed, but only abnormal results are displayed)  Labs Reviewed  BASIC METABOLIC PANEL  CBC  TROPONIN I  POCT PREGNANCY, URINE   ____________________________________________  EKG  ED ECG REPORT I, 06/28/16, the attending physician, personally viewed and interpreted this ECG.  Date: 04/28/2016 EKG Time: 8:52 AM Rate: 74 Rhythm: normal sinus rhythm QRS Axis: normal Intervals: normal ST/T Wave abnormalities: normal Conduction Disturbances: none Narrative Interpretation: unremarkable  ____________________________________________  RADIOLOGY  Chest x-ray unremarkable ____________________________________________   PROCEDURES  Procedure(s) performed: No    Critical Care performed: No ____________________________________________   INITIAL IMPRESSION / ASSESSMENT AND PLAN / ED COURSE  Pertinent labs & imaging results that were available during my care of the patient were reviewed by me and considered in my medical decision making (see chart for  details).  Patient well-appearing and in no acute distress. EKG is reassuring. Lab work is benign. No current chest pain. Symptoms sound consistent with gastroenteritis given her nausea vomiting and diarrhea and mild abdominal discomfort. We will give GI cocktail but feel supportive care is appropriate and discharge with outpatient followup. Return precautions discussed    ____________________________________________   FINAL CLINICAL IMPRESSION(S) / ED DIAGNOSES  Final diagnoses:  Atypical chest pain      NEW MEDICATIONS STARTED DURING THIS VISIT:  Discharge Medication List as of 04/28/2016 11:04 AM       Note:  This document was prepared using Dragon voice recognition software and may include unintentional dictation errors.    06/28/2016, MD 04/28/16 1145

## 2016-04-28 NOTE — ED Triage Notes (Signed)
Says chest pain and dizziness. Says she is under a lot of stress lateely

## 2016-04-28 NOTE — ED Notes (Signed)
Pt states she takes propanolol for chronic chest pain.

## 2016-09-05 ENCOUNTER — Encounter (HOSPITAL_COMMUNITY): Payer: Self-pay | Admitting: Emergency Medicine

## 2016-09-05 ENCOUNTER — Emergency Department (HOSPITAL_COMMUNITY)
Admission: EM | Admit: 2016-09-05 | Discharge: 2016-09-05 | Disposition: A | Discharge status: Home / Self Care | Payer: Medicaid Other | Attending: Emergency Medicine | Admitting: Emergency Medicine

## 2016-09-05 ENCOUNTER — Emergency Department (HOSPITAL_COMMUNITY): Payer: Medicaid Other

## 2016-09-05 DIAGNOSIS — Z5321 Procedure and treatment not carried out due to patient leaving prior to being seen by health care provider: Secondary | ICD-10-CM | POA: Insufficient documentation

## 2016-09-05 DIAGNOSIS — R002 Palpitations: Secondary | ICD-10-CM | POA: Insufficient documentation

## 2016-09-05 NOTE — ED Notes (Signed)
Pt called to room no answer  

## 2016-09-05 NOTE — ED Notes (Signed)
No answer in waiting room, X3,

## 2016-09-05 NOTE — ED Triage Notes (Signed)
Pt reports that she has a lot of stress That she has CP since last night and her face is numb  [facility]

## 2016-09-05 NOTE — ED Triage Notes (Signed)
Pt c/o palpitations since last night, headache, dizziness, and states the right side of face feels numb.

## 2016-09-05 NOTE — ED Notes (Signed)
No answer in waiting room 

## 2016-09-05 NOTE — ED Notes (Signed)
Sitting in chair sleeping

## 2016-12-02 ENCOUNTER — Emergency Department (HOSPITAL_COMMUNITY): Payer: Medicaid Other

## 2016-12-02 ENCOUNTER — Emergency Department (HOSPITAL_COMMUNITY)
Admission: EM | Admit: 2016-12-02 | Discharge: 2016-12-02 | Disposition: A | Discharge status: Home / Self Care | Payer: Medicaid Other | Attending: Emergency Medicine | Admitting: Emergency Medicine

## 2016-12-02 ENCOUNTER — Other Ambulatory Visit: Payer: Self-pay

## 2016-12-02 ENCOUNTER — Encounter (HOSPITAL_COMMUNITY): Payer: Self-pay | Admitting: Emergency Medicine

## 2016-12-02 DIAGNOSIS — F17210 Nicotine dependence, cigarettes, uncomplicated: Secondary | ICD-10-CM | POA: Insufficient documentation

## 2016-12-02 DIAGNOSIS — M25562 Pain in left knee: Secondary | ICD-10-CM | POA: Insufficient documentation

## 2016-12-02 DIAGNOSIS — M25561 Pain in right knee: Secondary | ICD-10-CM | POA: Insufficient documentation

## 2016-12-02 DIAGNOSIS — Z79899 Other long term (current) drug therapy: Secondary | ICD-10-CM | POA: Insufficient documentation

## 2016-12-02 MED ORDER — KETOROLAC TROMETHAMINE 60 MG/2ML IM SOLN
60.0000 mg | Freq: Once | INTRAMUSCULAR | Status: AC
  Administered 2016-12-02: 60 mg via INTRAMUSCULAR
  Filled 2016-12-02: qty 2

## 2016-12-02 MED ORDER — IBUPROFEN 800 MG PO TABS: 800.0000 mg | ORAL_TABLET | Freq: Three times a day (TID) | ORAL | 0 refills | Status: DC

## 2016-12-02 MED ORDER — TRAMADOL HCL 50 MG PO TABS
50.0000 mg | ORAL_TABLET | Freq: Once | ORAL | Status: AC
  Administered 2016-12-02: 50 mg via ORAL
  Filled 2016-12-02: qty 1

## 2016-12-02 MED ORDER — TRAMADOL HCL 50 MG PO TABS: 50.0000 mg | ORAL_TABLET | Freq: Four times a day (QID) | ORAL | 0 refills | Status: DC | PRN

## 2016-12-02 NOTE — ED Notes (Signed)
Patient in xray 

## 2016-12-02 NOTE — Discharge Instructions (Signed)
Wear the brace as needed with walking and standing.  Do not wear at bedtime.  Call one of the orthopedic providers listed to arrange a follow-up appt in one week if not improving

## 2016-12-02 NOTE — ED Provider Notes (Signed)
[facility] EMERGENCY DEPARTMENT Provider Note   CSN: [id number] Arrival date & time: 12/02/16  8841     History   Chief Complaint Chief Complaint  Patient presents with  . Knee Pain    HPI Linda Calderon is a 41 y.o. female.  HPI  Linda Calderon is a 41 y.o. female who presents to the Emergency Department complaining of worsening bilateral knee pain for 2 weeks.  She describes a throbbing sensation to the inner portion of both knees that is worse upon standing in the morning and improves throughout the day.  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  Left knee is worse than right and she states that her knee feels "swollen" she denies other arthralgias, fever, recent illness, redness or excessive warmth of the joints.  Has been taking ibuprofen with minimal relief.   Past Medical History:  Diagnosis Date  . ADD (attention deficit disorder)   . Chronic neck and back pain   . Pneumonia   . PTSD (post-traumatic stress disorder)   . Radiculopathy   . UTI (lower urinary tract infection)     There are no active problems to display for this patient.   Past Surgical History:  Procedure Laterality Date  . ABDOMINAL HYSTERECTOMY    . TUBAL LIGATION      OB History    Gravida Para Term Preterm AB Living   8 1 1   7 1    SAB TAB Ectopic Multiple Live Births   7               Home Medications    Prior to Admission medications   Medication Sig Start Date End Date Taking? Authorizing Provider  ALPRAZolam Prudy Feeler) 1 MG tablet Take 1 mg by mouth 3 (three) times daily.    [provider]  amphetamine-dextroamphetamine (ADDERALL) 20 MG tablet Take 20 mg by mouth every morning.     [provider]  Aspirin-Acetaminophen-Caffeine (GOODY HEADACHE PO) Take 1 Package by mouth daily as needed (for pain).    [provider]  azithromycin (ZITHROMAX) 250 MG tablet Take 1 tablet (250 mg  total) by mouth daily. Take first 2 tablets together, then 1 every day until finished. 01/18/16   Long, Arlyss Repress, MD  cyclobenzaprine (FLEXERIL) 5 MG tablet Take 1 tablet (5 mg total) by mouth 3 (three) times daily as needed (pain). 10/17/15   Devoria Albe, MD  HYDROcodone-acetaminophen (NORCO) 5-325 MG tablet Take 1 tablet by mouth every 6 (six) hours as needed for moderate pain. 02/28/16   Irean Hong, MD  ibuprofen (ADVIL,MOTRIN) 600 MG tablet Take 1 tablet (600 mg total) by mouth every 8 (eight) hours as needed. 02/28/16   Irean Hong, MD  metroNIDAZOLE (FLAGYL) 500 MG tablet Take 1 tablet (500 mg total) by mouth 2 (two) times daily. 10/17/15   Devoria Albe, MD  mirtazapine (REMERON) 45 MG tablet Take 45 mg by mouth at bedtime.    [provider]  naproxen (NAPROSYN) 250 MG tablet Take 1 tablet (250 mg total) by mouth 2 (two) times daily. 10/17/15   Devoria Albe, MD  ondansetron (ZOFRAN) 4 MG tablet Take 1 tablet (4 mg total) by mouth every 6 (six) hours. 10/17/15   Devoria Albe, MD    Family History Family History  Problem Relation Age of Onset  . Hypertension Mother   . Hypertension Father   .  Cancer Father     Social History Social History   Tobacco Use  . Smoking status: Current Every Day Smoker    Packs/day: 1.00    Years: 10.00    Pack years: 10.00    Types: Cigarettes  . Smokeless tobacco: Never Used  Substance Use Topics  . Alcohol use: No  . Drug use: No     Allergies   Morphine and related   Review of Systems Review of Systems  Constitutional: Negative for chills and fever.  Respiratory: Negative for cough and shortness of breath.   Genitourinary: Negative for difficulty urinating and dysuria.  Musculoskeletal: Positive for arthralgias (Bilateral knee pain) and joint swelling.  Skin: Negative for color change and wound.  Allergic/Immunologic: Negative for immunocompromised state.  Neurological: Negative for weakness and numbness.  All other systems reviewed and  are negative.    Physical Exam Updated Vital Signs BP 114/81 (BP Location: Left Arm)   Pulse 68   Temp 97.8 F (36.6 C) (Oral)   Resp 18   Ht 5\' 4"  (1.626 m)   Wt 51.3 kg (113 lb)   LMP 03/09/2011   SpO2 94%   BMI 19.40 kg/m   Physical Exam  Constitutional: She is oriented to person, place, and time. She appears well-developed and well-nourished. No distress.  HENT:  Head: Atraumatic.  Cardiovascular: Normal rate, regular rhythm and intact distal pulses.  Pulmonary/Chest: Effort normal and breath sounds normal. No respiratory distress.  Musculoskeletal: She exhibits tenderness. She exhibits no edema.  ttp of the bilateral, medial knees with left > right.  No erythema, edema, effusion, or step-off deformity.  No patellar crepitus on ROM.  Calf is soft and NT.  Remaining joints are NT  Neurological: She is alert and oriented to person, place, and time. No sensory deficit. She exhibits normal muscle tone. Coordination normal.  Skin: Skin is warm and dry. Capillary refill takes less than 2 seconds. No erythema.  Psychiatric: She has a normal mood and affect.  Nursing note and vitals reviewed.    ED Treatments / Results  Labs (all labs ordered are listed, but only abnormal results are displayed) Labs Reviewed - No data to display  EKG  EKG Interpretation None       Radiology Dg Knee Complete 4 Views Left  Result Date: 12/02/2016 CLINICAL DATA:  Bilateral knee pain, left greater than right, symptoms worse with weight-bearing. Duration of symptoms 2 weeks. No known injury. EXAM: LEFT KNEE - COMPLETE 4+ VIEW; RIGHT KNEE - COMPLETE 4+ VIEW COMPARISON:  None in PACs FINDINGS: Right knee: The bones are subjectively adequately mineralized. The joint spaces are well maintained. There is no significant osteophyte formation. There is no fracture, dislocation, or joint effusion. Left knee: The bones are subjectively adequately mineralized. The joint spaces are well maintained. There  is no significant osteophyte formation. There is no acute fracture, dislocation, or joint effusion. IMPRESSION: There is no acute or significant chronic bony abnormality of the knees. Electronically Signed   By: David  12/04/2016 M.D.   On: 12/02/2016 11:19   Dg Knee Complete 4 Views Right  Result Date: 12/02/2016 CLINICAL DATA:  Bilateral knee pain, left greater than right, symptoms worse with weight-bearing. Duration of symptoms 2 weeks. No known injury. EXAM: LEFT KNEE - COMPLETE 4+ VIEW; RIGHT KNEE - COMPLETE 4+ VIEW COMPARISON:  None in PACs FINDINGS: Right knee: The bones are subjectively adequately mineralized. The joint spaces are well maintained. There is no significant osteophyte formation. There is  no fracture, dislocation, or joint effusion. Left knee: The bones are subjectively adequately mineralized. The joint spaces are well maintained. There is no significant osteophyte formation. There is no acute fracture, dislocation, or joint effusion. IMPRESSION: There is no acute or significant chronic bony abnormality of the knees. Electronically Signed   By: David  [place] M.D.   On: 12/02/2016 11:19    Procedures Procedures (including critical care time)  Medications Ordered in ED Medications - No data to display   Initial Impression / Assessment and Plan / ED Course  I have reviewed the triage vital signs and the nursing notes.  Pertinent labs & imaging results that were available during my care of the patient were reviewed by me and considered in my medical decision making (see chart for details).     Pt is well appearing.  Bilateral knee pain with left > right.  XR are reassuring.  No concerning sx's for septic joints.  NV intact.  Pt agrees to symptomatic tx and ortho f/u in one week if not improving.    Knee sleeve applied by nursing to the left knee.   Final Clinical Impressions(s) / ED Diagnoses   Final diagnoses:  Acute pain of both knees    ED Discharge Orders    None         Pauline Aus, PA-C 12/02/16 1201    Samuel Jester, DO 12/04/16 1422

## 2016-12-02 NOTE — ED Triage Notes (Signed)
Pt reports bilateral knee pain for last several weeks. Pt reports increased pain with ambulation. Pt denies any known injury.

## 2017-03-24 DIAGNOSIS — M059 Rheumatoid arthritis with rheumatoid factor, unspecified: Secondary | ICD-10-CM | POA: Insufficient documentation

## 2017-04-05 DIAGNOSIS — Z79899 Other long term (current) drug therapy: Secondary | ICD-10-CM | POA: Insufficient documentation

## 2017-04-05 DIAGNOSIS — R42 Dizziness and giddiness: Secondary | ICD-10-CM | POA: Insufficient documentation

## 2017-05-12 ENCOUNTER — Emergency Department (HOSPITAL_COMMUNITY): Payer: Medicaid Other

## 2017-05-12 ENCOUNTER — Other Ambulatory Visit: Payer: Self-pay

## 2017-05-12 ENCOUNTER — Emergency Department (HOSPITAL_COMMUNITY)
Admission: EM | Admit: 2017-05-12 | Discharge: 2017-05-12 | Disposition: A | Discharge status: Home / Self Care | Payer: Medicaid Other | Attending: Emergency Medicine | Admitting: Emergency Medicine

## 2017-05-12 ENCOUNTER — Encounter (HOSPITAL_COMMUNITY): Payer: Self-pay

## 2017-05-12 DIAGNOSIS — M542 Cervicalgia: Secondary | ICD-10-CM | POA: Diagnosis not present

## 2017-05-12 DIAGNOSIS — S46911A Strain of unspecified muscle, fascia and tendon at shoulder and upper arm level, right arm, initial encounter: Secondary | ICD-10-CM | POA: Diagnosis not present

## 2017-05-12 DIAGNOSIS — M549 Dorsalgia, unspecified: Secondary | ICD-10-CM | POA: Diagnosis not present

## 2017-05-12 DIAGNOSIS — F17210 Nicotine dependence, cigarettes, uncomplicated: Secondary | ICD-10-CM | POA: Insufficient documentation

## 2017-05-12 DIAGNOSIS — X500XXA Overexertion from strenuous movement or load, initial encounter: Secondary | ICD-10-CM | POA: Insufficient documentation

## 2017-05-12 DIAGNOSIS — Y9389 Activity, other specified: Secondary | ICD-10-CM | POA: Insufficient documentation

## 2017-05-12 DIAGNOSIS — Y92511 Restaurant or cafe as the place of occurrence of the external cause: Secondary | ICD-10-CM | POA: Diagnosis not present

## 2017-05-12 DIAGNOSIS — Y999 Unspecified external cause status: Secondary | ICD-10-CM | POA: Insufficient documentation

## 2017-05-12 DIAGNOSIS — Z79899 Other long term (current) drug therapy: Secondary | ICD-10-CM | POA: Diagnosis not present

## 2017-05-12 DIAGNOSIS — S4991XA Unspecified injury of right shoulder and upper arm, initial encounter: Secondary | ICD-10-CM | POA: Diagnosis present

## 2017-05-12 MED ORDER — METHOCARBAMOL 500 MG PO TABS: 500.0000 mg | ORAL_TABLET | Freq: Four times a day (QID) | ORAL | 0 refills | Status: AC

## 2017-05-12 MED ORDER — HYDROCODONE-ACETAMINOPHEN 5-325 MG PO TABS
1.0000 | ORAL_TABLET | Freq: Once | ORAL | Status: AC
  Administered 2017-05-12: 1 via ORAL
  Filled 2017-05-12: qty 1

## 2017-05-12 MED ORDER — IBUPROFEN 800 MG PO TABS
800.0000 mg | ORAL_TABLET | Freq: Once | ORAL | Status: AC
  Administered 2017-05-12: 800 mg via ORAL
  Filled 2017-05-12: qty 1

## 2017-05-12 MED ORDER — HYDROCODONE-ACETAMINOPHEN 5-325 MG PO TABS: 1.0000 | ORAL_TABLET | ORAL | 0 refills | Status: DC | PRN

## 2017-05-12 NOTE — ED Triage Notes (Signed)
Pt is having right sided pain extending from elbow to the back of her neck. Pt has been seeing a rheumatologist for this. Pt has been diagnosed with rheumatoid arthritis. Pt states the medication she is taking for this doesn't seem to be helping. Pt states it's hard to move right arm

## 2017-05-12 NOTE — Discharge Instructions (Addendum)
Your xray is normal today and your exam suggests a great degree of muscle soreness, probably from the overactivity at work you experienced on Friday.  Use the medicines prescribed,  robaxin will help with muscle spasm, hydrocodone will help with pain relief but will make you drowsy - do not drive within 4 hours of taking this medicine.  I recommend applying a heating pad to your shoulder for 20 minutes several times daily.  Follow up with your provider if your symptoms persist or worsen.

## 2017-05-12 NOTE — ED Provider Notes (Signed)
[facility] EMERGENCY DEPARTMENT Provider Note   CSN: [id number] Arrival date & time: 05/12/17  6789     History   Chief Complaint Chief Complaint  Patient presents with  . Arm Pain    HPI Linda Calderon is a 42 y.o. female presenting with right shoulder and upper back pain since she had a sweep and mop the entire floor at a restaurant 5 days ago.  She has a history of rheumatoid arthritis currently on week 5 of an 8 week course of methotrexate which she states has not been helpful yet and does endorse some chronic neck and upper back pain at baseline, but the shoulder pain is new since this overuse event.  She has increased pain with attempts at movement of the right shoulder.  She denies swelling, redness or warmth in the shoulder joint.  She has found rest to be her only alleviator.  The history is provided by the patient.    Past Medical History:  Diagnosis Date  . ADD (attention deficit disorder)   . Chronic neck and back pain   . Pneumonia   . PTSD (post-traumatic stress disorder)   . Radiculopathy   . UTI (lower urinary tract infection)     There are no active problems to display for this patient.   Past Surgical History:  Procedure Laterality Date  . ABDOMINAL HYSTERECTOMY    . TUBAL LIGATION       OB History    Gravida  8   Para  1   Term  1   Preterm      AB  7   Living  1     SAB  7   TAB      Ectopic      Multiple      Live Births               Home Medications    Prior to Admission medications   Medication Sig Start Date End Date Taking? Authorizing Provider  ALPRAZolam Prudy Feeler) 1 MG tablet Take 1 mg by mouth 3 (three) times daily.   Yes [provider]  amphetamine-dextroamphetamine (ADDERALL) 20 MG tablet Take 20 mg by mouth every morning.    Yes [provider]  Aspirin-Acetaminophen-Caffeine (GOODY HEADACHE PO) Take 1 Package by mouth daily as needed (for pain).   Yes [provider]  methotrexate  (RHEUMATREX) 2.5 MG tablet TAKE 6 TABS (15 MG ) ONCE A WEEK, 4 WEEKS, 2 REFILLS 04/05/17  Yes [provider]  mirtazapine (REMERON) 45 MG tablet Take 45 mg by mouth at bedtime.   Yes [provider]  propranolol (INDERAL) 20 MG tablet Take 1 tablet by mouth twice daily 04/28/17  Yes [provider]  HYDROcodone-acetaminophen (NORCO/VICODIN) 5-325 MG tablet Take 1 tablet by mouth every 4 (four) hours as needed. 05/12/17   Burgess Amor, PA-C  methocarbamol (ROBAXIN) 500 MG tablet Take 1 tablet (500 mg total) by mouth 4 (four) times daily for 10 days. 05/12/17 05/22/17  Burgess Amor, PA-C    Family History Family History  Problem Relation Age of Onset  . Hypertension Mother   . Hypertension Father   . Cancer Father     Social History Social History   Tobacco Use  . Smoking status: Current Every Day Smoker    Packs/day: 1.00    Years: 10.00    Pack years: 10.00    Types: Cigarettes  . Smokeless tobacco: Never Used  Substance Use Topics  .  Alcohol use: No  . Drug use: No     Allergies   Morphine and related   Review of Systems Review of Systems  Constitutional: Negative for fever.  Musculoskeletal: Positive for arthralgias. Negative for joint swelling and myalgias.  Neurological: Negative for weakness and numbness.     Physical Exam Updated Vital Signs BP 98/72 (BP Location: Left Arm)   Pulse 90   Temp 98.1 F (36.7 C) (Oral)   Resp 15   Ht 5\' 4"  (1.626 m)   Wt 53.1 kg (117 lb)   LMP 03/09/2011   SpO2 99%   BMI 20.08 kg/m   Physical Exam  Constitutional: She appears well-developed and well-nourished.  HENT:  Head: Atraumatic.  Neck: Normal range of motion.  Cardiovascular:  Pulses equal bilaterally  Musculoskeletal: She exhibits tenderness.  ttp across right trapezius musculature to the right cervical base, superior scapular rim and posterior right humeral head.  No joint edema, crepitis. No pain with passive ROM of the right shoulder.     Neurological: She is alert. She has normal strength. She displays normal reflexes. No sensory deficit.  Reflex Scores:      Bicep reflexes are 2+ on the right side and 2+ on the left side. Equal grip strength.  Skin: Skin is warm and dry.  Psychiatric: She has a normal mood and affect.     ED Treatments / Results  Labs (all labs ordered are listed, but only abnormal results are displayed) Labs Reviewed - No data to display  EKG None  Radiology Dg Shoulder Right  Result Date: 05/12/2017 CLINICAL DATA:  Right anterior and posterior shoulder pain 5 days. No injury. Diagnosed with rheumatoid arthritis. EXAM: RIGHT SHOULDER - 2+ VIEW COMPARISON:  None. FINDINGS: There is no evidence of fracture or dislocation. There is no evidence of arthropathy or other focal bone abnormality. Soft tissues are unremarkable. IMPRESSION: Negative. Electronically Signed   By: 05/14/2017 M.D.   On: 05/12/2017 11:11    Procedures Procedures (including critical care time)  Medications Ordered in ED Medications  ibuprofen (ADVIL,MOTRIN) tablet 800 mg (800 mg Oral Given 05/12/17 1025)  HYDROcodone-acetaminophen (NORCO/VICODIN) 5-325 MG per tablet 1 tablet (1 tablet Oral Given 05/12/17 1025)     Initial Impression / Assessment and Plan / ED Course  I have reviewed the triage vital signs and the nursing notes.  Pertinent labs & imaging results that were available during my care of the patient were reviewed by me and considered in my medical decision making (see chart for details).    Imaging reviewed and negative.  Pt with muscle strain from overuse, doubt this is a rheumatoid flare, exam suggests trapezius strain.  Hydrocodone, robaxin, heat tx.  Pt cannot take nsaids while on methotrexate.  Advised f/uwith pcp or her rheumatologist for persistent pain (has appt in 3 weeks with rheum).   The patient appears reasonably screened and/or stabilized for discharge and I doubt any other medical condition or  other [facility] requiring further screening, evaluation, or treatment in the ED at this time prior to discharge.   Final Clinical Impressions(s) / ED Diagnoses   Final diagnoses:  Strain of right shoulder, initial encounter    ED Discharge Orders        Ordered    HYDROcodone-acetaminophen (NORCO/VICODIN) 5-325 MG tablet  Every 4 hours PRN     05/12/17 1121    methocarbamol (ROBAXIN) 500 MG tablet  4 times daily     05/12/17 1121  Burgess Amor, PA-C 05/12/17 1201    Bethann Berkshire, MD 05/12/17 1301

## 2017-11-25 DIAGNOSIS — M069 Rheumatoid arthritis, unspecified: Secondary | ICD-10-CM | POA: Insufficient documentation

## 2018-05-31 ENCOUNTER — Emergency Department (HOSPITAL_COMMUNITY)
Admission: EM | Admit: 2018-05-31 | Discharge: 2018-05-31 | Disposition: A | Discharge status: Home / Self Care | Payer: Medicaid Other | Attending: Emergency Medicine | Admitting: Emergency Medicine

## 2018-05-31 ENCOUNTER — Other Ambulatory Visit: Payer: Self-pay

## 2018-05-31 ENCOUNTER — Emergency Department (HOSPITAL_COMMUNITY): Payer: Medicaid Other

## 2018-05-31 ENCOUNTER — Encounter (HOSPITAL_COMMUNITY): Payer: Self-pay | Admitting: Emergency Medicine

## 2018-05-31 DIAGNOSIS — F17210 Nicotine dependence, cigarettes, uncomplicated: Secondary | ICD-10-CM | POA: Insufficient documentation

## 2018-05-31 DIAGNOSIS — S60221A Contusion of right hand, initial encounter: Secondary | ICD-10-CM | POA: Insufficient documentation

## 2018-05-31 DIAGNOSIS — Y939 Activity, unspecified: Secondary | ICD-10-CM | POA: Insufficient documentation

## 2018-05-31 DIAGNOSIS — Y999 Unspecified external cause status: Secondary | ICD-10-CM | POA: Diagnosis not present

## 2018-05-31 DIAGNOSIS — W2209XA Striking against other stationary object, initial encounter: Secondary | ICD-10-CM | POA: Insufficient documentation

## 2018-05-31 DIAGNOSIS — Z79899 Other long term (current) drug therapy: Secondary | ICD-10-CM | POA: Diagnosis not present

## 2018-05-31 DIAGNOSIS — S6991XA Unspecified injury of right wrist, hand and finger(s), initial encounter: Secondary | ICD-10-CM | POA: Diagnosis present

## 2018-05-31 DIAGNOSIS — Y929 Unspecified place or not applicable: Secondary | ICD-10-CM | POA: Insufficient documentation

## 2018-05-31 DIAGNOSIS — T148XXA Other injury of unspecified body region, initial encounter: Secondary | ICD-10-CM

## 2018-05-31 MED ORDER — HYDROCODONE-ACETAMINOPHEN 5-325 MG PO TABS
1.0000 | ORAL_TABLET | Freq: Once | ORAL | Status: AC
  Administered 2018-05-31: 04:00:00 1 via ORAL
  Filled 2018-05-31: qty 1

## 2018-05-31 MED ORDER — IBUPROFEN 400 MG PO TABS
400.0000 mg | ORAL_TABLET | Freq: Once | ORAL | Status: AC
  Administered 2018-05-31: 400 mg via ORAL
  Filled 2018-05-31: qty 1

## 2018-05-31 MED ORDER — ACETAMINOPHEN ER 650 MG PO TBCR: 650.0000 mg | EXTENDED_RELEASE_TABLET | Freq: Three times a day (TID) | ORAL | 0 refills | Status: DC

## 2018-05-31 MED ORDER — NAPROXEN 500 MG PO TABS: 500.0000 mg | ORAL_TABLET | Freq: Two times a day (BID) | ORAL | 0 refills | Status: DC
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
## 2018-05-31 NOTE — ED Provider Notes (Signed)
[facility] EMERGENCY DEPARTMENT Provider Note   CSN: [id number] Arrival date & time: 05/31/18  0309    History   Chief Complaint Chief Complaint  Patient presents with  . Hand Pain    HPI Linda Calderon is a 43 y.o. female.    HPI 43 year old female with history of ADD, PTSD comes in with chief complaint of hand pain.  Patient is left-handed dominant, and reports that 3 days ago she punched a wall.  Since then she has been having increased amount of pain in her right hand.  She also has noticed some bruising.  Tonight she was unable to sleep well because of the pain and decided to come to the ER.  Review of system is negative for any numbness, tingling.  She denies injuries elsewhere.   Past Medical History:  Diagnosis Date  . ADD (attention deficit disorder)   . Chronic neck and back pain   . Pneumonia   . PTSD (post-traumatic stress disorder)   . Radiculopathy   . UTI (lower urinary tract infection)     There are no active problems to display for this patient.   Past Surgical History:  Procedure Laterality Date  . ABDOMINAL HYSTERECTOMY    . TUBAL LIGATION       OB History    Gravida  8   Para  1   Term  1   Preterm      AB  7   Living  1     SAB  7   TAB      Ectopic      Multiple      Live Births               Home Medications    Prior to Admission medications   Medication Sig Start Date End Date Taking? Authorizing Provider  acetaminophen (TYLENOL 8 HOUR) 650 MG CR tablet Take 1 tablet (650 mg total) by mouth every 8 (eight) hours. 05/31/18   Derwood Kaplan, MD  ALPRAZolam Prudy Feeler) 1 MG tablet Take 1 mg by mouth 3 (three) times daily.    [provider]  amphetamine-dextroamphetamine (ADDERALL) 20 MG tablet Take 20 mg by mouth every morning.     [provider]  Aspirin-Acetaminophen-Caffeine (GOODY HEADACHE PO) Take 1 Package by mouth daily as needed (for pain).    [provider]   HYDROcodone-acetaminophen (NORCO/VICODIN) 5-325 MG tablet Take 1 tablet by mouth every 4 (four) hours as needed. 05/12/17   Burgess Amor, PA-C  methotrexate (RHEUMATREX) 2.5 MG tablet TAKE 6 TABS (15 MG ) ONCE A WEEK, 4 WEEKS, 2 REFILLS 04/05/17   [provider]  mirtazapine (REMERON) 45 MG tablet Take 45 mg by mouth at bedtime.    [provider]  naproxen (NAPROSYN) 500 MG tablet Take 1 tablet (500 mg total) by mouth 2 (two) times daily. 05/31/18   Derwood Kaplan, MD  propranolol (INDERAL) 20 MG tablet Take 1 tablet by mouth twice daily 04/28/17   [provider]    Family History Family History  Problem Relation Age of Onset  . Hypertension Mother   . Hypertension Father   . Cancer Father     Social History Social History   Tobacco Use  . Smoking status: Current Every Day Smoker    Packs/day: 1.00    Years: 10.00    Pack years: 10.00    Types: Cigarettes  . Smokeless tobacco: Never Used  Substance Use Topics  . Alcohol use:  No  . Drug use: No     Allergies   Morphine and related   Review of Systems Review of Systems  Constitutional: Positive for activity change.  Musculoskeletal: Positive for arthralgias.     Physical Exam Updated Vital Signs BP 111/79 (BP Location: Left Arm)   Pulse 77   Temp 97.8 F (36.6 C) (Oral)   Resp 17   Ht 5\' 4"  (1.626 m)   Wt 53.1 kg   LMP 03/09/2011   SpO2 96%   BMI 20.08 kg/m   Physical Exam Vitals signs and nursing note reviewed.  Constitutional:      Appearance: She is well-developed.  HENT:     Head: Normocephalic and atraumatic.  Neck:     Musculoskeletal: Normal range of motion and neck supple.  Cardiovascular:     Rate and Rhythm: Normal rate.  Pulmonary:     Effort: Pulmonary effort is normal.  Abdominal:     General: Bowel sounds are normal.  Musculoskeletal:        General: Tenderness present. No swelling.     Comments: Patient has tenderness over the dorsum of the right hand.   Tenderness is primarily at the base of third fourth and fifth metatarsal. Patient is able to flex and extend all digits over all of the IP joints.  She is also able to oppose, abduct and adduct without any difficulty.  Skin:    General: Skin is warm and dry.     Findings: Bruising present.  Neurological:     Mental Status: She is alert and oriented to person, place, and time.      ED Treatments / Results  Labs (all labs ordered are listed, but only abnormal results are displayed) Labs Reviewed - No data to display  EKG None  Radiology Dg Hand Complete Right  Result Date: 05/31/2018 CLINICAL DATA:  43 year old female status post blunt trauma 3 days ago, punched wall. EXAM: RIGHT HAND - COMPLETE 3+ VIEW COMPARISON:  None. FINDINGS: Bone mineralization is within normal limits. There is no evidence of fracture or dislocation. There is no evidence of arthropathy or other focal bone abnormality. No discrete soft tissue injury identified. IMPRESSION: Negative. Electronically Signed   By: Odessa FlemingH  Hall M.D.   On: 05/31/2018 04:21    Procedures Procedures (including critical care time)  Medications Ordered in ED Medications  HYDROcodone-acetaminophen (NORCO/VICODIN) 5-325 MG per tablet 1 tablet (1 tablet Oral Given 05/31/18 0359)  ibuprofen (ADVIL) tablet 400 mg (400 mg Oral Given 05/31/18 0430)     Initial Impression / Assessment and Plan / ED Course  I have reviewed the triage vital signs and the nursing notes.  Pertinent labs & imaging results that were available during my care of the patient were reviewed by me and considered in my medical decision making (see chart for details).        43 year old female comes in a chief complaint of hand pain.  She sustained injury to her right hand 3 days ago.  Neurovascularly she is intact.  X-rays ordered and did not reveal any signs of fracture.  We will place the hand in a Velcro splint and have patient follow-up with her PCP in 7 to 10 days.  Rice treatment advised.  Final Clinical Impressions(s) / ED Diagnoses   Final diagnoses:  Contusion of right hand, initial encounter  Hematoma and contusion    ED Discharge Orders         Ordered    naproxen (NAPROSYN) 500  MG tablet  2 times daily     05/31/18 0442    acetaminophen (TYLENOL 8 HOUR) 650 MG CR tablet  Every 8 hours     05/31/18 0442           Derwood Kaplan, MD 05/31/18 [phone]

## 2018-05-31 NOTE — Discharge Instructions (Signed)
You are seen in the ER for your hand injury.  Fortunately the x-rays did not reveal any signs of fracture. We suspect that you have a contusion from your injury. Please take the medications prescribed and keep your hand in the wrist splint for support.  Ice the hand at least 4 times a day for 10 minutes each, and you may start alternating between ice and heat in 2 to 3 days.  We recommend that you follow-up with your PCP in 10 days.

## 2019-01-29 ENCOUNTER — Encounter (HOSPITAL_COMMUNITY): Payer: Self-pay | Admitting: Emergency Medicine

## 2019-01-29 ENCOUNTER — Emergency Department (HOSPITAL_COMMUNITY)
Admission: EM | Admit: 2019-01-29 | Discharge: 2019-01-29 | Disposition: A | Discharge status: Home / Self Care | Payer: Medicaid Other | Attending: Emergency Medicine | Admitting: Emergency Medicine

## 2019-01-29 ENCOUNTER — Other Ambulatory Visit: Payer: Self-pay

## 2019-01-29 DIAGNOSIS — R519 Headache, unspecified: Secondary | ICD-10-CM

## 2019-01-29 DIAGNOSIS — K068 Other specified disorders of gingiva and edentulous alveolar ridge: Secondary | ICD-10-CM | POA: Diagnosis not present

## 2019-01-29 DIAGNOSIS — F17210 Nicotine dependence, cigarettes, uncomplicated: Secondary | ICD-10-CM | POA: Diagnosis not present

## 2019-01-29 DIAGNOSIS — Z79899 Other long term (current) drug therapy: Secondary | ICD-10-CM | POA: Diagnosis not present

## 2019-01-29 DIAGNOSIS — R042 Hemoptysis: Secondary | ICD-10-CM | POA: Diagnosis present

## 2019-01-29 DIAGNOSIS — M5489 Other dorsalgia: Secondary | ICD-10-CM | POA: Insufficient documentation

## 2019-01-29 DIAGNOSIS — R109 Unspecified abdominal pain: Secondary | ICD-10-CM | POA: Insufficient documentation

## 2019-01-29 DIAGNOSIS — M7918 Myalgia, other site: Secondary | ICD-10-CM | POA: Diagnosis not present

## 2019-01-29 DIAGNOSIS — Z20822 Contact with and (suspected) exposure to covid-19: Secondary | ICD-10-CM | POA: Diagnosis not present

## 2019-01-29 DIAGNOSIS — R52 Pain, unspecified: Secondary | ICD-10-CM

## 2019-01-29 LAB — URINALYSIS, ROUTINE W REFLEX MICROSCOPIC
Bilirubin Urine: NEGATIVE
Glucose, UA: NEGATIVE mg/dL
Hgb urine dipstick: NEGATIVE
Ketones, ur: NEGATIVE mg/dL
Leukocytes,Ua: NEGATIVE
Nitrite: NEGATIVE
Protein, ur: NEGATIVE mg/dL
Specific Gravity, Urine: 1.011 (ref 1.005–1.030)
pH: 6 (ref 5.0–8.0)

## 2019-01-29 LAB — COMPREHENSIVE METABOLIC PANEL
ALT: 13 U/L (ref 0–44)
AST: 22 U/L (ref 15–41)
Albumin: 3.7 g/dL (ref 3.5–5.0)
Alkaline Phosphatase: 93 U/L (ref 38–126)
Anion gap: 10 (ref 5–15)
BUN: 9 mg/dL (ref 6–20)
CO2: 23 mmol/L (ref 22–32)
Calcium: 9.2 mg/dL (ref 8.9–10.3)
Chloride: 106 mmol/L (ref 98–111)
Creatinine, Ser: 0.88 mg/dL (ref 0.44–1.00)
GFR calc Af Amer: >60 mL/min (ref >60)
GFR calc non Af Amer: >60 mL/min (ref >60)
Glucose, Bld: 87 mg/dL (ref 70–99)
Potassium: 3.8 mmol/L (ref 3.5–5.1)
Sodium: 139 mmol/L (ref 135–145)
Total Bilirubin: 0.4 mg/dL (ref 0.3–1.2)
Total Protein: 8 g/dL (ref 6.5–8.1)

## 2019-01-29 LAB — CBC
HCT: 43.4 % (ref 36.0–46.0)
Hemoglobin: 14 g/dL (ref 12.0–15.0)
MCH: 29.7 pg (ref 26.0–34.0)
MCHC: 32.3 g/dL (ref 30.0–36.0)
MCV: 91.9 fL (ref 80.0–100.0)
Platelets: 486 10*3/uL — ABNORMAL HIGH (ref 150–400)
RBC: 4.72 MIL/uL (ref 3.87–5.11)
RDW: 13.2 % (ref 11.5–15.5)
WBC: 15.2 10*3/uL — ABNORMAL HIGH (ref 4.0–10.5)
nRBC: 0 % (ref 0.0–0.2)

## 2019-01-29 LAB — LIPASE, BLOOD: Lipase: 47 U/L (ref 11–51)

## 2019-01-29 LAB — SARS CORONAVIRUS 2 (TAT 6-24 HRS): SARS Coronavirus 2: NEGATIVE

## 2019-01-29 MED ORDER — ONDANSETRON 4 MG PO TBDP: 4.0000 mg | ORAL_TABLET | Freq: Three times a day (TID) | ORAL | 0 refills | Status: DC | PRN

## 2019-01-29 MED ORDER — SODIUM CHLORIDE 0.9% FLUSH: 3.0000 mL | Freq: Once | INTRAVENOUS | Status: DC

## 2019-01-29 MED ORDER — BUTALBITAL-APAP-CAFFEINE 50-325-40 MG PO TABS
1.0000 | ORAL_TABLET | ORAL | Status: AC
  Administered 2019-01-29: 1 via ORAL
  Filled 2019-01-29: qty 1

## 2019-01-29 NOTE — Discharge Instructions (Addendum)
Please use Tylenol ibuprofen for pain.  I prescribed Zofran if you have any issues with nausea you may fill this. Please return to ED if you have any new or concerning symptoms including fevers, neck pain, worsening abdominal pain.  Please drink water--at least several glasses a day.

## 2019-01-29 NOTE — ED Provider Notes (Signed)
[facility] EMERGENCY DEPARTMENT Provider Note   CSN: [id number] Arrival date & time: 01/29/19  0844     History Chief Complaint  Patient presents with  . spitting up blood  . Abdominal Pain  . Back Pain    Linda Calderon is a 44 y.o. female.  HPI Patient is a 44 year old female with a history of PTSD anxiety and ADD with total abdominal hysterectomy  Patient presents today with abdominal pain that is generalized, extends into bilateral flanks and lower abdomen.  Patient states the pain is crampy and mild.  States it comes and goes.  No aggravating or mitigating factors.  Patient presents today for 2 months of blood in her saliva when she is brushing her teeth.  She denies any cough or hemoptysis.  Denies any fevers or chills.  States that she has had no dark tarry stools.  Does endorse some lightheaded and dizziness and states that she notices these sx when she sees the blood. Denies any syncope or near syncope.   Denies any chest pain or shortness of breath.  Does state that she has a weird taste in her mouth and feels fatigued upon exertion also endorses generalized body aches.  States she has decreased appetite and fatigue.  States sometimes she feels short of breath when she exerts herself.  Has not noticed any wheezing though.  Also endorses occasional chills.  Denies any known Covid exposure but states that she is not been isolating very well.  Denies any vaginal discharge, vaginal pain or itching denies any concern for STIs, denies any hematuria or burning of urination.  Patient denies any IVDU, blood thinner use, history of cancer, weight changes, saddle anesthesia numbness or weakness in her legs.  Denies any fevers.     Past Medical History:  Diagnosis Date  . ADD (attention deficit disorder)   . Chronic neck and back pain   . Pneumonia   . PTSD (post-traumatic stress disorder)   . Radiculopathy   . UTI (lower urinary tract infection)     There  are no problems to display for this patient.   Past Surgical History:  Procedure Laterality Date  . ABDOMINAL HYSTERECTOMY    . TUBAL LIGATION       OB History    Gravida  8   Para  1   Term  1   Preterm      AB  7   Living  1     SAB  7   TAB      Ectopic      Multiple      Live Births              Family History  Problem Relation Age of Onset  . Hypertension Mother   . Hypertension Father   . Cancer Father     Social History   Tobacco Use  . Smoking status: Current Every Day Smoker    Packs/day: 1.00    Years: 10.00    Pack years: 10.00    Types: Cigarettes  . Smokeless tobacco: Never Used  Substance Use Topics  . Alcohol use: No  . Drug use: No    Home Medications Prior to Admission medications   Medication Sig Start Date End Date Taking? Authorizing Provider  ALPRAZolam Prudy Feeler) 1 MG tablet Take 1 mg by mouth 3 (three) times daily.   Yes [provider]  amphetamine-dextroamphetamine (ADDERALL) 20 MG tablet Take 20 mg by mouth every morning.  Yes [provider]  mirtazapine (REMERON) 45 MG tablet Take 45 mg by mouth daily.    Yes [provider]  propranolol (INDERAL) 20 MG tablet Take 20 mg by mouth 2 (two) times daily.  04/28/17  Yes [provider]  acetaminophen (TYLENOL 8 HOUR) 650 MG CR tablet Take 1 tablet (650 mg total) by mouth every 8 (eight) hours. Patient not taking: Reported on 01/29/2019 05/31/18   Derwood Kaplan, MD  HYDROcodone-acetaminophen (NORCO/VICODIN) 5-325 MG tablet Take 1 tablet by mouth every 4 (four) hours as needed. Patient not taking: Reported on 01/29/2019 05/12/17   Burgess Amor, PA-C  naproxen (NAPROSYN) 500 MG tablet Take 1 tablet (500 mg total) by mouth 2 (two) times daily. Patient not taking: Reported on 01/29/2019 05/31/18   Derwood Kaplan, MD  ondansetron (ZOFRAN ODT) 4 MG disintegrating tablet Take 1 tablet (4 mg total) by mouth every 8 (eight) hours as needed for nausea or  vomiting. 01/29/19   Gailen Shelter, PA    Allergies    Morphine and related  Review of Systems   Review of Systems  Constitutional: Positive for activity change, appetite change and fatigue. Negative for chills and fever.  HENT: Negative for congestion.   Eyes: Negative for pain.  Respiratory: Positive for shortness of breath. Negative for cough and wheezing.   Cardiovascular: Negative for chest pain, palpitations and leg swelling.  Gastrointestinal: Positive for abdominal pain, nausea and vomiting. Negative for diarrhea.  Genitourinary: Negative for dysuria, hematuria, vaginal bleeding, vaginal discharge and vaginal pain.  Musculoskeletal: Positive for myalgias. Negative for neck stiffness.  Skin: Negative for rash.  Neurological: Negative for dizziness and headaches.    Physical Exam Updated Vital Signs BP 100/79 (BP Location: Right Arm)   Pulse 81   Temp 98.2 F (36.8 C) (Oral)   Resp 18   LMP 03/09/2011   SpO2 99%   Physical Exam Vitals and nursing note reviewed.  Constitutional:      General: She is not in acute distress. HENT:     Head: Normocephalic and atraumatic.     Nose: Nose normal.     Mouth/Throat:     Mouth: Mucous membranes are moist.   Eyes:     General: No scleral icterus.    Pupils: Pupils are equal, round, and reactive to light.  Cardiovascular:     Rate and Rhythm: Normal rate and regular rhythm.     Pulses: Normal pulses.     Heart sounds: Normal heart sounds.  Pulmonary:     Effort: Pulmonary effort is normal. No respiratory distress.     Breath sounds: No wheezing.  Abdominal:     General: Bowel sounds are normal.     Palpations: Abdomen is soft.     Tenderness: There is no abdominal tenderness. There is no right CVA tenderness, left CVA tenderness, guarding or rebound.     Hernia: No hernia is present.  Musculoskeletal:     Cervical back: Normal range of motion.     Right lower leg: No edema.     Left lower leg: No edema.  Skin:     General: Skin is warm and dry.     Capillary Refill: Capillary refill takes less than 2 seconds.  Neurological:     Mental Status: She is alert. Mental status is at baseline.  Psychiatric:        Mood and Affect: Mood normal.        Behavior: Behavior normal.  ED Results / Procedures / Treatments   Labs (all labs ordered are listed, but only abnormal results are displayed) Labs Reviewed  CBC - Abnormal; Notable for the following components:      Result Value   WBC 15.2 (*)    Platelets 486 (*)    All other components within normal limits  SARS CORONAVIRUS 2 (TAT 6-24 HRS)  LIPASE, BLOOD  COMPREHENSIVE METABOLIC PANEL  URINALYSIS, ROUTINE W REFLEX MICROSCOPIC    EKG None  Radiology No results found.  Procedures Procedures (including critical care time)  Medications Ordered in ED Medications  sodium chloride flush (NS) 0.9 % injection 3 mL (has no administration in time range)  butalbital-acetaminophen-caffeine (FIORICET) 50-325-40 MG per tablet 1 tablet (1 tablet Oral Given 01/29/19 1126)    ED Course  I have reviewed the triage vital signs and the nursing notes.  Pertinent labs & imaging results that were available during my care of the patient were reviewed by me and considered in my medical decision making (see chart for details).    MDM Rules/Calculators/A&P                      Patient is a 44 year old female presented today for general fatigue, some GI upset, diffuse body aches, headache changes in taste over the past week.  Is uncertain of Covid exposure.  Denies any cough but does endorse some bleeding from her gums.  Physical exam is benign she has no abdominal tenderness on my exam just generalized abdominal pain that is mild.  Is most complaining of headache and body aches.  Mild leukocytosis likely attributable to emesis.  No evidence of infection on physical exam history or lab work.  PERC negative.  Doubt PE.  Afebrile and without cough or symptoms  to indicate pneumonia.  Suspect viral etiology of disease however she does have leukocytosis of 15.2 no evidence of infection on urine and electrolytes are all within normal limits.  She is well-appearing at this time will give Fioricet for headache and discharged with recommendations to stay hydrated.  Given strict return precautions.  I discussed this case with my attending physician who cosigned this note including patient's presenting symptoms, physical exam, and planned diagnostics and interventions. Attending physician stated agreement with plan or made changes to plan which were implemented.   This patient presents with abdominal pain of unclear etiology. Their evaluation has not identified a emergent etiology for the abdominal pain. Specifically, given the very benign exam, normal laboratory studies, and lack of significant risk factors, I have a very low suspicion for appendicitis, ischemic bowel, bowel perforation, or any other life threatening disease. I have discussed with the patient the level of uncertainty with undifferentiated abdominal pain and clearly explained the need to follow-up as noted on the discharge instructions, or return to the Emergency Department immediately if the pain worsens, develops fever, persistent and uncontrollable vomiting, or for any new symptoms or concerns. I discussed with the patient that this presentation today for abdominal pain could represent a significant risk for an acute abdominal process. Although the tests in the ED were essentially normal, there is still a possibility of a process such as appendicitis, diverticulitis, cholecystitis, ulcer, early bowel obstruction, mesenteric ischemia, kidney stone, or even kidney infection which could subsequently cause disability or death. The patient understands that they must return within 24 hours for a recheck or see their physician within 24 hours for re-exam due to the possibility of significant surgical or  medical  process.  Linda Calderon was evaluated in Emergency Department on 01/29/2019 for the symptoms described in the history of present illness. She was evaluated in the context of the global COVID-19 pandemic, which necessitated consideration that the patient might be at risk for infection with the SARS-CoV-2 virus that causes COVID-19. Institutional protocols and algorithms that pertain to the evaluation of patients at risk for COVID-19 are in a state of rapid change based on information released by regulatory bodies including the CDC and federal and state organizations. These policies and algorithms were followed during the patient's care in the ED.   Final Clinical Impression(s) / ED Diagnoses Final diagnoses:  Body aches  Gums, bleeding  Bad headache    Rx / DC Orders ED Discharge Orders         Ordered    ondansetron (ZOFRAN ODT) 4 MG disintegrating tablet  Every 8 hours PRN     01/29/19 1141           Tedd Sias, [place] 01/29/19 1620    Lucrezia Starch, MD 01/30/19 [phone]

## 2019-01-29 NOTE — ED Notes (Signed)
Family at bedside. 

## 2019-01-29 NOTE — ED Notes (Signed)
States she has been spitting out blood for several months, got upset last Monday night and states she vomited and it was bright red blood. States  She spits out blood everyday. C/o pain at the lower part of her ribs. C/o lower back pain . C/o diarrhea for several days.

## 2019-01-29 NOTE — ED Triage Notes (Signed)
Pt states she got upset Monday and was crying.  Started spitting up bright red blood that has continued.  Also reports headache, pain to bilateral sides, and L lower back/flank pain.

## 2019-06-11 ENCOUNTER — Emergency Department (HOSPITAL_COMMUNITY)
Admission: EM | Admit: 2019-06-11 | Discharge: 2019-06-12 | Disposition: A | Discharge status: Home / Self Care | Payer: Medicaid Other | Attending: Emergency Medicine | Admitting: Emergency Medicine

## 2019-06-11 ENCOUNTER — Other Ambulatory Visit: Payer: Self-pay

## 2019-06-11 DIAGNOSIS — K529 Noninfective gastroenteritis and colitis, unspecified: Secondary | ICD-10-CM | POA: Insufficient documentation

## 2019-06-11 DIAGNOSIS — R110 Nausea: Secondary | ICD-10-CM | POA: Diagnosis not present

## 2019-06-11 DIAGNOSIS — R1031 Right lower quadrant pain: Secondary | ICD-10-CM | POA: Diagnosis present

## 2019-06-11 DIAGNOSIS — F17210 Nicotine dependence, cigarettes, uncomplicated: Secondary | ICD-10-CM | POA: Insufficient documentation

## 2019-06-11 DIAGNOSIS — R911 Solitary pulmonary nodule: Secondary | ICD-10-CM | POA: Insufficient documentation

## 2019-06-11 LAB — CBC
HCT: 43.1 % (ref 36.0–46.0)
Hemoglobin: 14.1 g/dL (ref 12.0–15.0)
MCH: 29.4 pg (ref 26.0–34.0)
MCHC: 32.7 g/dL (ref 30.0–36.0)
MCV: 89.8 fL (ref 80.0–100.0)
Platelets: 350 10*3/uL (ref 150–400)
RBC: 4.8 MIL/uL (ref 3.87–5.11)
RDW: 13.6 % (ref 11.5–15.5)
WBC: 12.1 10*3/uL — ABNORMAL HIGH (ref 4.0–10.5)
nRBC: 0 % (ref 0.0–0.2)

## 2019-06-11 LAB — COMPREHENSIVE METABOLIC PANEL
ALT: 12 U/L (ref 0–44)
AST: 18 U/L (ref 15–41)
Albumin: 3.7 g/dL (ref 3.5–5.0)
Alkaline Phosphatase: 95 U/L (ref 38–126)
Anion gap: 11 (ref 5–15)
BUN: 5 mg/dL — ABNORMAL LOW (ref 6–20)
CO2: 19 mmol/L — ABNORMAL LOW (ref 22–32)
Calcium: 9.2 mg/dL (ref 8.9–10.3)
Chloride: 106 mmol/L (ref 98–111)
Creatinine, Ser: 0.8 mg/dL (ref 0.44–1.00)
GFR calc Af Amer: >60 mL/min (ref >60)
GFR calc non Af Amer: >60 mL/min (ref >60)
Glucose, Bld: 100 mg/dL — ABNORMAL HIGH (ref 70–99)
Potassium: 3.8 mmol/L (ref 3.5–5.1)
Sodium: 136 mmol/L (ref 135–145)
Total Bilirubin: 0.7 mg/dL (ref 0.3–1.2)
Total Protein: 7.7 g/dL (ref 6.5–8.1)

## 2019-06-11 LAB — URINALYSIS, ROUTINE W REFLEX MICROSCOPIC
Bilirubin Urine: NEGATIVE
Glucose, UA: NEGATIVE mg/dL
Hgb urine dipstick: NEGATIVE
Ketones, ur: NEGATIVE mg/dL
Leukocytes,Ua: NEGATIVE
Nitrite: NEGATIVE
Protein, ur: NEGATIVE mg/dL
Specific Gravity, Urine: 1.005 (ref 1.005–1.030)
pH: 5 (ref 5.0–8.0)

## 2019-06-11 LAB — LIPASE, BLOOD: Lipase: 48 U/L (ref 11–51)

## 2019-06-11 MED ORDER — SODIUM CHLORIDE 0.9% FLUSH: 3.0000 mL | Freq: Once | INTRAVENOUS | Status: DC

## 2019-06-11 NOTE — ED Triage Notes (Signed)
Patient reports RLQ abdominal pain with diarrhea and nausea onset last night , no fever , mild chills.

## 2019-06-12 ENCOUNTER — Emergency Department (HOSPITAL_COMMUNITY): Payer: Medicaid Other

## 2019-06-12 MED ORDER — FENTANYL CITRATE (PF) 100 MCG/2ML IJ SOLN
50.0000 ug | Freq: Once | INTRAMUSCULAR | Status: AC
  Administered 2019-06-12: 50 ug via INTRAVENOUS
  Filled 2019-06-12: qty 2

## 2019-06-12 MED ORDER — ONDANSETRON HCL 4 MG PO TABS: 4.0000 mg | ORAL_TABLET | Freq: Three times a day (TID) | ORAL | 0 refills | Status: DC | PRN

## 2019-06-12 MED ORDER — HYDROCODONE-ACETAMINOPHEN 5-325 MG PO TABS: 1.0000 | ORAL_TABLET | Freq: Four times a day (QID) | ORAL | 0 refills | Status: DC | PRN

## 2019-06-12 MED ORDER — IOHEXOL 300 MG/ML  SOLN
100.0000 mL | Freq: Once | INTRAMUSCULAR | Status: AC | PRN
  Administered 2019-06-12: 100 mL via INTRAVENOUS

## 2019-06-12 MED ORDER — ONDANSETRON HCL 4 MG/2ML IJ SOLN
4.0000 mg | Freq: Once | INTRAMUSCULAR | Status: AC
  Administered 2019-06-12: 4 mg via INTRAVENOUS
  Filled 2019-06-12: qty 2

## 2019-06-12 MED ORDER — CIPROFLOXACIN HCL 500 MG PO TABS
500.0000 mg | ORAL_TABLET | Freq: Two times a day (BID) | ORAL | 0 refills | Status: DC
Start: 2019-06-12 — End: 2019-06-23

## 2019-06-12 NOTE — ED Provider Notes (Signed)
[facility] EMERGENCY DEPARTMENT Provider Note   CSN: [id number] Arrival date & time: 06/11/19  2118     History Chief Complaint  Patient presents with  . Abdominal Pain    Linda Calderon is a 44 y.o. female.  Patient presents to the emergency department with a chief complaint of right lower abdominal pain.  She states that the pain started the night before last.  It has gradually worsened.  She rates pain as severe now.  She reports associated nausea, but she has not had vomiting.  She reports that she had a small amount of diarrhea.  She denies dysuria, hematuria, or urinary frequency.  Denies vaginal discharge or bleeding.  She has prior history of abdominal hysterectomy.  She denies fevers or chills.  She is concerned that she has appendicitis.  The history is provided by the patient. No language interpreter was used.       Past Medical History:  Diagnosis Date  . ADD (attention deficit disorder)   . Chronic neck and back pain   . Pneumonia   . PTSD (post-traumatic stress disorder)   . Radiculopathy   . UTI (lower urinary tract infection)     There are no problems to display for this patient.   Past Surgical History:  Procedure Laterality Date  . ABDOMINAL HYSTERECTOMY    . TUBAL LIGATION       OB History    Gravida  8   Para  1   Term  1   Preterm      AB  7   Living  1     SAB  7   TAB      Ectopic      Multiple      Live Births              Family History  Problem Relation Age of Onset  . Hypertension Mother   . Hypertension Father   . Cancer Father     Social History   Tobacco Use  . Smoking status: Current Every Day Smoker    Packs/day: 1.00    Years: 10.00    Pack years: 10.00    Types: Cigarettes  . Smokeless tobacco: Never Used  Substance Use Topics  . Alcohol use: No  . Drug use: No    Home Medications Prior to Admission medications   Medication Sig Start Date End Date Taking? Authorizing  Provider  acetaminophen (TYLENOL 8 HOUR) 650 MG CR tablet Take 1 tablet (650 mg total) by mouth every 8 (eight) hours. Patient not taking: Reported on 01/29/2019 05/31/18   Varney Biles, MD  ALPRAZolam Duanne Moron) 1 MG tablet Take 1 mg by mouth 3 (three) times daily.    [provider]  amphetamine-dextroamphetamine (ADDERALL) 20 MG tablet Take 20 mg by mouth every morning.     [provider]  HYDROcodone-acetaminophen (NORCO/VICODIN) 5-325 MG tablet Take 1 tablet by mouth every 4 (four) hours as needed. Patient not taking: Reported on 01/29/2019 05/12/17   Evalee Jefferson, PA-C  mirtazapine (REMERON) 45 MG tablet Take 45 mg by mouth daily.     [provider]  naproxen (NAPROSYN) 500 MG tablet Take 1 tablet (500 mg total) by mouth 2 (two) times daily. Patient not taking: Reported on 01/29/2019 05/31/18   Varney Biles, MD  ondansetron (ZOFRAN ODT) 4 MG disintegrating tablet Take 1 tablet (4 mg total) by mouth every 8 (eight) hours as needed for nausea or vomiting. 01/29/19  Pati Gallo S, PA  propranolol (INDERAL) 20 MG tablet Take 20 mg by mouth 2 (two) times daily.  04/28/17   [provider]    Allergies    Morphine and related  Review of Systems   Review of Systems  All other systems reviewed and are negative.   Physical Exam Updated Vital Signs BP 119/88 (BP Location: Right Arm)   Pulse 70   Temp 97.9 F (36.6 C) (Oral)   Resp 17   Ht _0  (1.626 m)   Wt 53.5 kg   LMP 03/09/2011   SpO2 100%   BMI 20.25 kg/m   Physical Exam Vitals and nursing note reviewed.  Constitutional:      General: She is not in acute distress.    Appearance: She is well-developed.  HENT:     Head: Normocephalic and atraumatic.  Eyes:     Conjunctiva/sclera: Conjunctivae normal.  Cardiovascular:     Rate and Rhythm: Normal rate and regular rhythm.     Heart sounds: No murmur.  Pulmonary:     Effort: Pulmonary effort is normal. No respiratory distress.      Breath sounds: Normal breath sounds.  Abdominal:     Palpations: Abdomen is soft.     Tenderness: There is abdominal tenderness.     Comments: Right lower quadrant tender to palpation  Musculoskeletal:        General: Normal range of motion.     Cervical back: Neck supple.  Skin:    General: Skin is warm and dry.  Neurological:     Mental Status: She is alert and oriented to person, place, and time.  Psychiatric:        Mood and Affect: Mood normal.        Behavior: Behavior normal.     ED Results / Procedures / Treatments   Labs (all labs ordered are listed, but only abnormal results are displayed) Labs Reviewed  COMPREHENSIVE METABOLIC PANEL - Abnormal; Notable for the following components:      Result Value   CO2 19 (*)    Glucose, Bld 100 (*)    BUN 5 (*)    All other components within normal limits  CBC - Abnormal; Notable for the following components:   WBC 12.1 (*)    All other components within normal limits  URINALYSIS, ROUTINE W REFLEX MICROSCOPIC - Abnormal; Notable for the following components:   APPearance HAZY (*)    All other components within normal limits  LIPASE, BLOOD    EKG None  Radiology CT ABDOMEN PELVIS W CONTRAST  Result Date: 06/12/2019 CLINICAL DATA:  Right lower quadrant abdominal pain with nausea and diarrhea for 2 days EXAM: CT ABDOMEN AND PELVIS WITH CONTRAST TECHNIQUE: Multidetector CT imaging of the abdomen and pelvis was performed using the standard protocol following bolus administration of intravenous contrast. CONTRAST:  160m OMNIPAQUE IOHEXOL 300 MG/ML  SOLN COMPARISON:  CT 10/17/2015 FINDINGS: Lower chest: Chronic centrilobular ground-glass nodularity within the middle lobe and lingula. Bilateral lower lobes are clear with right lower lobe inferior accessory fissure. Normal heart size. No pericardial effusion. Hepatobiliary: Tiny subcentimeter hypoattenuating focus along the anterior left lobe liver (3/15) too small to fully  characterize on CT imaging but statistically likely benign. no worrisome focal liver lesions. Smooth liver surface contour. Normal hepatic attenuation. Normal gallbladder. No visible calcified gallstones. Normal biliary tree. Pancreas: Unremarkable. No pancreatic ductal dilatation or surrounding inflammatory changes. Spleen: Normal in size without focal abnormality. Adrenals/Urinary Tract: Normal  adrenal glands. Kidneys are normally located with symmetric enhancement and excretion. 2.4 cm fluid attenuation cyst seen in the upper pole right kidney with additional smaller 1.2 cm 1 cm cyst in the interpolar left kidney. Additional smaller subcentimeter hypoattenuating foci in both kidneys too small to fully characterize on CT imaging but statistically likely benign. No suspicious renal lesion, urolithiasis or hydronephrosis. Urinary bladder is largely decompressed at the time of exam and therefore poorly evaluated by CT imaging. No gross bladder abnormality is seen. Stomach/Bowel: Distal esophagus, stomach and duodenal sweep are unremarkable. No small bowel wall thickening or dilatation. A normal appendix is visualized. There is some mild pancolonic mural thickening with mucosal hyperemia with minimal if any significant pericolonic stranding. No evidence of obstruction. Vascular/Lymphatic: No significant vascular findings are present. No enlarged abdominal or pelvic lymph nodes. Reproductive: Uterus is surgically absent. Retained ovarian tissue. 1.9 cm dominant follicle in the left ovary. Small follicles in the right ovary. No concerning adnexal lesions. Other: No abdominopelvic free fluid or free gas. No bowel containing hernias. Musculoskeletal: No acute osseous abnormality or suspicious osseous lesion. Levocurvature of the lumbar spine with an apex at the L3 level. There is partial lumbarization of the left L5 transverse process with marked arthrosis at the articulation with the superior sacral ala. Grade 1  anterolisthesis L4 on L5 with facet arthropathy but no clear pars defect/spondylolysis. IMPRESSION: 1. Mild pancolonic mural thickening with mucosal hyperemia. Findings are nonspecific but may represent a mild colitis of infectious or inflammatory etiology in the appropriate clinical setting. 2. No other acute abdominopelvic finding to provide cause for patient's symptoms. Specifically, the appendix is normal. 3. 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 Nodularity has been present for numerous years. This pattern of disease is often seen with atypical mycobacterial infection, mycobacterium avium complex. Consider pulmonology consultation on an outpatient basis if not previously obtained. 4. 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 5. Grade 1 anterolisthesis L4 on L5 with facet arthropathy but no clear pars defect/spondylolysis. Electronically Signed   By: Lovena Le M.D.   On: 06/12/2019 04:02    Procedures Procedures (including critical care time)  Medications Ordered in ED Medications  sodium chloride flush (NS) 0.9 % injection 3 mL (3 mLs Intravenous Not Given 06/12/19 0236)  fentaNYL (SUBLIMAZE) injection 50 mcg (has no administration in time range)  ondansetron (ZOFRAN) injection 4 mg (has no administration in time range)    ED Course  I have reviewed the triage vital signs and the nursing notes.  Pertinent labs & imaging results that were available during my care of the patient were reviewed by me and considered in my medical decision making (see chart for details).    MDM Rules/Calculators/A&P                      This patient complains of abdominal pain, this involves an extensive number of treatment options, and is a complaint that carries with it a high risk of complications and morbidity.  The differential diagnosis includes appendicitis,  cholecystitis, pancreatitis, TOA, ovarian torsion.  Pertinent Labs I ordered, reviewed, and interpreted labs, which included CBC notable for mild leukocytosis to 12.1.  CMP is reassuring.  Lipase is normal.  Doubt pancreatitis or cholecystitis given reassuring labs and no right upper quadrant tenderness.  Imaging Interpretation I ordered imaging studies which included CT abdomen pelvis.  CT shows evidence  for colitis.  There are also incidental findings as above.  I discussed these findings with the patient and provided her with a report of her CT.  She is advised to discuss these results with her primary care doctor, she may need follow-up in the future.  Medications I ordered medication fentanyl and Zofran for pain and nausea.  Sources Previous records obtained and reviewed, she has been seen in frequently over the past couple years.  She does have history of abdominal hysterectomy.  Reassessments After the interventions stated above, I reevaluated the patient and found feeling improved.  CT shows no acute emergent process.  Plan for outpatient treatment given that she is not vomiting.  Patient understands and agrees with plan.  Return precautions have been discussed.    Final Clinical Impression(s) / ED Diagnoses Final diagnoses:  Colitis  Pulmonary nodule    Rx / DC Orders ED Discharge Orders         Ordered    HYDROcodone-acetaminophen (NORCO/VICODIN) 5-325 MG tablet  Every 6 hours PRN     06/12/19 0432    ciprofloxacin (CIPRO) 500 MG tablet  2 times daily     06/12/19 0432    ondansetron (ZOFRAN) 4 MG tablet  Every 8 hours PRN     06/12/19 0432           Montine Circle, PA-C 06/12/19 0443    Leonette Monarch Grayce Sessions, MD 06/12/19 [phone]

## 2019-06-12 NOTE — Discharge Instructions (Addendum)
Your CT showed findings consistent with colitis, or inflammation/infection of your colon.  I've prescribed you antibiotics to treat this.  Stick to a bland diet for the next few days.  Return if your symptoms worsen.   Your CT scan showed several incidental findings. I've attached your CT report to this handout.  Please discuss the results with your doctor.

## 2019-06-12 NOTE — ED Provider Notes (Signed)
Attestation: Medical screening examination/treatment/procedure(s) were conducted as a shared visit with non-physician practitioner(s) and myself.  I personally evaluated the patient during the encounter.   Briefly, the patient is a 44 y.o. female here with several days of right TTP and diarrhea.  Vitals:   06/12/19 0245 06/12/19 0300  BP: 102/83 121/84  Pulse: 79 75  Resp:    Temp:    SpO2: 98% 100%    CONSTITUTIONAL: Well-appearing, NAD NEURO:  Alert and oriented x 3, no focal deficits EYES:  pupils equal and reactive ENT/NECK:  trachea midline, no JVD CARDIO: Regular  rate, regular rhythm, well-perfused PULM: None labored breathing GI/GU:  Abdomin non-distended, right lower quadrant TTP MSK/SPINE:  No gross deformities, no edema SKIN:  no rash, atraumatic PSYCH:  Appropriate speech and behavior   EKG Interpretation  Date/Time:    Ventricular Rate:    PR Interval:    QRS Duration:   QT Interval:    QTC Calculation:   R Axis:     Text Interpretation:         Work up notable for mild colitis.  No evidence of appendicitis, urinary tract infection. Short Cipro course.  The patient appears reasonably screened and/or stabilized for discharge and I doubt any other medical condition or other [facility] requiring further screening, evaluation, or treatment in the ED at this time prior to discharge. Safe for discharge with strict return precautions.      Nira Conn, MD 06/12/19 [phone]

## 2019-06-23 ENCOUNTER — Other Ambulatory Visit
Admission: RE | Admit: 2019-06-23 | Discharge: 2019-06-23 | Disposition: A | Discharge status: Home / Self Care | Payer: Medicaid Other | Source: Ambulatory Visit | Attending: Internal Medicine | Admitting: Internal Medicine

## 2019-06-23 ENCOUNTER — Ambulatory Visit (INDEPENDENT_AMBULATORY_CARE_PROVIDER_SITE_OTHER): Payer: Medicaid Other | Admitting: Internal Medicine

## 2019-06-23 ENCOUNTER — Encounter: Payer: Self-pay | Admitting: Internal Medicine

## 2019-06-23 VITALS — BP 130/90 | HR 89 | Temp 97.3°F | Ht 64.0 in | Wt 111.8 lb

## 2019-06-23 DIAGNOSIS — R7989 Other specified abnormal findings of blood chemistry: Secondary | ICD-10-CM | POA: Diagnosis not present

## 2019-06-23 DIAGNOSIS — R768 Other specified abnormal immunological findings in serum: Secondary | ICD-10-CM | POA: Insufficient documentation

## 2019-06-23 DIAGNOSIS — Z8739 Personal history of other diseases of the musculoskeletal system and connective tissue: Secondary | ICD-10-CM | POA: Diagnosis not present

## 2019-06-23 DIAGNOSIS — R918 Other nonspecific abnormal finding of lung field: Secondary | ICD-10-CM

## 2019-06-23 DIAGNOSIS — R911 Solitary pulmonary nodule: Secondary | ICD-10-CM

## 2019-06-23 LAB — CBC WITH DIFFERENTIAL/PLATELET
Abs Immature Granulocytes: 0.04 10*3/uL (ref 0.00–0.07)
Basophils Absolute: 0.1 10*3/uL (ref 0.0–0.1)
Basophils Relative: 1 %
Eosinophils Absolute: 0.2 10*3/uL (ref 0.0–0.5)
Eosinophils Relative: 2 %
HCT: 38.7 % (ref 36.0–46.0)
Hemoglobin: 13.1 g/dL (ref 12.0–15.0)
Immature Granulocytes: 0 %
Lymphocytes Relative: 21 %
Lymphs Abs: 2.2 10*3/uL (ref 0.7–4.0)
MCH: 29.2 pg (ref 26.0–34.0)
MCHC: 33.9 g/dL (ref 30.0–36.0)
MCV: 86.4 fL (ref 80.0–100.0)
Monocytes Absolute: 0.5 10*3/uL (ref 0.1–1.0)
Monocytes Relative: 5 %
Neutro Abs: 7.5 10*3/uL (ref 1.7–7.7)
Neutrophils Relative %: 71 %
Platelets: 359 10*3/uL (ref 150–400)
RBC: 4.48 MIL/uL (ref 3.87–5.11)
RDW: 13.8 % (ref 11.5–15.5)
WBC: 10.5 10*3/uL (ref 4.0–10.5)
nRBC: 0 % (ref 0.0–0.2)

## 2019-06-23 NOTE — Progress Notes (Signed)
OV 06/23/2019  Subjective:  Patient ID: Linda Calderon, female , DOB: [birth year] , age 44 y.o. , MRN: [id number] , ADDRESS: [address] [place] [address]  PCP [facility] -Dr. Georgie Chard [previously]  06/23/2019 -   Chief Complaint  Patient presents with  . Consult    Patient had Ct scan done and was told she has nodules on her lungs. Patient is not having shortness of breath right now states that when she went to the ED she was having shortness of breath and pain but it has gone away. Dry cough in morning     HPI Linda Calderon 44 y.o. 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  She has a diagnosis of seropositive rheumatoid arthritis, CCP positive rheumatoid arthritis with manifestation bilateral shoulders, bilateral elbows hands and feet.  Diagnosis was in March 2019.  Symptoms started in January 2019.  She was on methotrexate along with prednisone.  Review of the chart indicates in 2020 February Remicade was added but approximately 9 months ago she stopped this because she is got worried that there is too much switching of rheumatoid arthritis medications going on.  Since then her joint pain particularly in her right hand and wrist is worse.  She also has early morning stiffness.  She has developed some deformities in one of the right finger and one of the left fingers.  In the context of all this she has not had any respiratory issues.  Specifically no shortness of breath no wheezing.  No orthopnea no proximal nocturnal dyspnea.  No hemoptysis.  She developed some abdominal pain in May 2021 ended up in the ER. She had a CT abdomen Jun 12, 2019.  This is compared to a CT chest in 2017.  The lung images on this show chronic nodular infiltrates in the middle lobe and lingula raising the possibility of MAI infection.  I personally visualized these images and confirm this.   In addition I feel the 2017 CT chest also shows some scattered groundglass opacities although it was a contrasted CT. therefore she has been referred to pulmonary.  She has been off all rheumatoid arthritis treatment for 9 months.  She has upcoming visit with Dr. Val Eagle in [place] with [facility].  She is worried about the infiltrates.  Her dad had both rheumatoid arthritis and lung cancer.  And he died from lung cancer stage IV.    Lab work was in the [facility] system February 2020 shows hemoglobin 11.8 g% which is slightly below her baseline in 2019.  She had normal liver function test in 2020  Review of the records from outside do not indicate evidence of any CT chest elsewhere in the past  ROS - per HPI     has a past medical history of ADD (attention deficit disorder), Chronic neck and back pain, Pneumonia, PTSD (post-traumatic stress disorder), Radiculopathy, and UTI (lower urinary tract infection).   reports that she has been smoking cigarettes. She has a 5.00 pack-year smoking history. She has never used smokeless tobacco.  Past Surgical History:  Procedure Laterality Date  . ABDOMINAL HYSTERECTOMY    . TUBAL LIGATION      Allergies  Allergen Reactions  . Morphine And Related     Dropped blood pressure, had to lay patient flat and administer IV    Immunization History  Administered Date(s) Administered  .  Influenza,inj,Quad PF,6-35 Mos 06/23/2018  . Moderna SARS-COVID-2 Vaccination 04/19/2019, 05/17/2019    Family History  Problem Relation Age of Onset  . Hypertension Mother   . Hypertension Father   . Cancer Father      Current Outpatient Medications:  .  ALPRAZolam (XANAX) 1 MG tablet, Take 1 mg by mouth 3 (three) times daily., Disp: , Rfl:  .  ondansetron (ZOFRAN) 4 MG tablet, Take 1 tablet (4 mg total) by mouth every 8 (eight) hours as needed for nausea or vomiting., Disp: 10 tablet, Rfl: 0      Objective:   Vitals:   06/23/19 1559  BP: 130/90    Pulse: 89  Temp: (!) 97.3 F (36.3 C)  TempSrc: Temporal  SpO2: 94%  Weight: 111 lb 12.8 oz (50.7 kg)  Height: 5\' 4"  (1.626 m)    Estimated body mass index is 19.19 kg/m as calculated from the following:   Height as of this encounter: 5\' 4"  (1.626 m).   Weight as of this encounter: 111 lb 12.8 oz (50.7 kg).  @WEIGHTCHANGE @    06/23/19 1559  Weight: 111 lb 12.8 oz (50.7 kg)     Physical Exam Thin female.  Pleasant.  Alert and oriented x3.  No neck nodes no elevated JVP.  Mild deformities of rheumatoid arthritis on the hands present.  No Raynaud's no scleroderma.  No other stigmata of connective tissue disease.  Lungs are clear to auscultation bilaterally.  Abdomen soft no cyanosis no clubbing no edema.       Assessment:       ICD-10-CM   1. Pulmonary infiltrates  R91.8   2. Rheumatoid factor positive  R76.8   3. Cyclic citrullinated peptide (CCP) antibody positive  R79.89   4. History of rheumatoid arthritis  Z87.39    Differential diagnosis here is MAI infection, rheumatoid arthritis involving lung parenchyma.  Very very low probability for lung cancer.  However only have CT abdomen lung images.  We need high-resolution CT chest.  I suspect with active rheumatoid arthritis we will looking at increased immunomodulation.  Therefore opportunistic infections or any infection for the current infiltrates will probably need to be ruled out and she would probably need bronchoscopy.  She is agreeable with the plan.  She says her cousin the respiratory therapist from [facility] has been supporting her and counseling her.    Plan:     Patient Instructions     ICD-10-CM   1. Pulmonary infiltrates  R91.8   2. Rheumatoid factor positive  R76.8   3. Cyclic citrullinated peptide (CCP) antibody positive  R79.89   4. History of rheumatoid arthritis  Z87.39       Plan  =- do HRCT chest supine and prone inspiratory and expiratory volume [can be done in  [place] or [place]] -Do full pulmonary function test [can do it in  too or [place]]  -Do blood QuantiFERON gold test today -The blood CBC with differential and IgE, IgG, IgM and IgA -Keep appointment with Dr. Morrie Sheldon rheumatologist on July 31, 2019  Follow-up -We will call you with the CT scan results and based on this we might have to consider a bronchoscopy in the next 4 to 8 weeks      SIGNATURE    Dr. Derby, M.D., F.C.C.P,  Pulmonary and Critical Care Medicine Staff Physician, [facility] Director - Interstitial Lung Disease  Program  Pulmonary Fibrosis [facility]  Pulmonary [place], [place], [address]  Pager: [phone], If no answer or between  15:00h - 7:00h: call [phone] Telephone: [phone]  4:22 PM 06/23/2019

## 2019-06-23 NOTE — Patient Instructions (Addendum)
ICD-10-CM   1. Pulmonary infiltrates  R91.8   2. Rheumatoid factor positive  R76.8   3. Cyclic citrullinated peptide (CCP) antibody positive  R79.89   4. History of rheumatoid arthritis  Z87.39       Plan  =- do HRCT chest supine and prone inspiratory and expiratory volume [can be done in [place] or [place]] -Do full pulmonary function test [can do it in [place] too or [place]]  -Do blood QuantiFERON gold test today -The blood CBC with differential and IgE, IgG, IgM and IgA -Keep appointment with Dr. Pollyann Savoy rheumatologist on July 31, 2019  Follow-up -We will call you with the CT scan results and based on this we might have to consider a bronchoscopy in the next 4 to 8 weeks

## 2019-06-29 LAB — IGE: IgE (Immunoglobulin E), Serum: 12 IU/mL (ref 6–495)

## 2019-07-03 ENCOUNTER — Other Ambulatory Visit: Payer: Self-pay

## 2019-07-03 ENCOUNTER — Other Ambulatory Visit
Admission: RE | Admit: 2019-07-03 | Discharge: 2019-07-03 | Disposition: A | Discharge status: Home / Self Care | Payer: Medicaid Other | Source: Ambulatory Visit | Attending: Internal Medicine | Admitting: Internal Medicine

## 2019-07-03 DIAGNOSIS — Z01812 Encounter for preprocedural laboratory examination: Secondary | ICD-10-CM | POA: Insufficient documentation

## 2019-07-03 DIAGNOSIS — Z20822 Contact with and (suspected) exposure to covid-19: Secondary | ICD-10-CM | POA: Diagnosis not present

## 2019-07-04 ENCOUNTER — Other Ambulatory Visit: Payer: Self-pay

## 2019-07-04 ENCOUNTER — Ambulatory Visit: Payer: Medicaid Other | Attending: Internal Medicine

## 2019-07-04 DIAGNOSIS — R768 Other specified abnormal immunological findings in serum: Secondary | ICD-10-CM | POA: Diagnosis not present

## 2019-07-04 DIAGNOSIS — R918 Other nonspecific abnormal finding of lung field: Secondary | ICD-10-CM | POA: Insufficient documentation

## 2019-07-04 DIAGNOSIS — R911 Solitary pulmonary nodule: Secondary | ICD-10-CM | POA: Insufficient documentation

## 2019-07-04 LAB — SARS CORONAVIRUS 2 (TAT 6-24 HRS): SARS Coronavirus 2: NEGATIVE

## 2019-07-04 MED ORDER — ALBUTEROL SULFATE (2.5 MG/3ML) 0.083% IN NEBU
2.5000 mg | INHALATION_SOLUTION | Freq: Once | RESPIRATORY_TRACT | Status: AC
  Administered 2019-07-04: 2.5 mg via RESPIRATORY_TRACT
  Filled 2019-07-04: qty 3

## 2019-07-10 ENCOUNTER — Other Ambulatory Visit: Payer: Self-pay

## 2019-07-10 ENCOUNTER — Ambulatory Visit
Admission: RE | Admit: 2019-07-10 | Discharge: 2019-07-10 | Disposition: A | Discharge status: Home / Self Care | Payer: Medicaid Other | Source: Ambulatory Visit | Attending: Internal Medicine | Admitting: Internal Medicine

## 2019-07-10 DIAGNOSIS — R911 Solitary pulmonary nodule: Secondary | ICD-10-CM | POA: Insufficient documentation

## 2019-07-21 NOTE — Progress Notes (Addendum)
Office Visit Note  Patient: Linda Calderon             Date of Birth: 1975-08-18           MRN: [id number]             PCP: [facility] Referring: Alvina Filbert, MD Visit Date: 07/31/2019 Occupation: @GUAROCC @  Subjective:  New Patient (Initial Visit) (RA,Right 4th digit swelling, left elbow pain and locking, right hand pain, right leg pain, right foot pain, DDD lumbar, seeing Dr. for lung issues)   History of Present Illness: Linda Calderon is a 44 y.o. female seen in consultation per request of her PCP.  According to the patient about 2 years ago she started having pain in multiple joints at the time she was seen by rheumatologist in [place].  She states she was diagnosed with rheumatoid arthritis and was a started on methotrexate.  She states methotrexate did not help her and she did not experience any side effects from it.  She was started on Humira injections which she took for about 2 months and then stopped them as she did not notice any improvement.  She was given 1 Remicade infusion and then she stopped going there as she felt that none of the medications were working.  She states she was given prednisone which helped with the swelling but not with the pain.  She has not had any rheumatological care in the last 1-1/2-year.  She states her joints are gradually getting worse.  She complains of discomfort in her right shoulder, right elbow, bilateral hands, left ankle joint and her feet.  She continues to have discomfort in all of her joints and swelling.  She has been recently experiencing lower back pain which radiates to her right lower extremity.  She was seen in the hospital on May 2021 due to abdominal pain and diarrhea.  At the time she had a CT scan of her abdomen which showed some colitis and groundglass changes in her lungs.  She was treated with antibiotics and the abdominal symptoms resolved.  She was also referred to Dr. June 2021 who did  initial evaluation and then high-resolution CT which showed chronic bronchiolitis and pulmonary nodules.  She is a scheduled to have a bronchoscopy.  There was a suspicion of Mycobacterium infection.  The CT scan of the abdomen also showed some degenerative changes in her lumbar spine.  She is gravida 9, para 1, miscarriages 8.  She states the miscarriages were related to lack of progesterone.  Activities of Daily Living:  Patient reports morning stiffness for 30 minutes.   Patient Reports nocturnal pain.  Difficulty dressing/grooming: Denies Difficulty climbing stairs: Denies Difficulty getting out of chair: Denies Difficulty using hands for taps, buttons, cutlery, and/or writing: Reports  Review of Systems  Constitutional: Positive for fatigue. Negative for night sweats, weight gain and weight loss.  HENT: Negative for mouth sores, trouble swallowing, trouble swallowing, mouth dryness and nose dryness.   Eyes: Negative for pain, redness, visual disturbance and dryness.  Respiratory: Negative for cough, shortness of breath and difficulty breathing.   Cardiovascular: Negative for chest pain, palpitations, hypertension, irregular heartbeat and swelling in legs/feet.  Gastrointestinal: Negative for blood in stool, constipation and diarrhea.  Endocrine: Negative for increased urination.  Genitourinary: Negative for difficulty urinating and vaginal dryness.  Musculoskeletal: Positive for arthralgias, joint pain, joint swelling, myalgias, morning stiffness and myalgias. Negative for muscle weakness and muscle tenderness.  Skin:  Negative for color change, rash, hair loss, skin tightness, ulcers and sensitivity to sunlight.  Allergic/Immunologic: Negative for susceptible to infections.  Neurological: Positive for numbness. Negative for dizziness, memory loss, night sweats and weakness.  Hematological: Positive for bruising/bleeding tendency. Negative for swollen glands.  Psychiatric/Behavioral:  Positive for sleep disturbance. Negative for depressed mood. The patient is nervous/anxious.     PMFS History:  Patient Active Problem List   Diagnosis Date Noted  . Pulmonary nodules 07/31/2019  . Bronchiolitis 07/31/2019  . DDD (degenerative disc disease), lumbar 07/31/2019  . PTSD (post-traumatic stress disorder) 07/31/2019  . 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 Smoker 07/31/2019    Past Medical History:  Diagnosis Date  . ADD (attention deficit disorder)   . Chronic neck and back pain   . Pneumonia   . PTSD (post-traumatic stress disorder)   . Radiculopathy   . UTI (lower urinary tract infection)     Family History  Problem Relation Age of Onset  . Hypertension Mother   . Hypertension Father   . Cancer Father    Past Surgical History:  Procedure Laterality Date  . ABDOMINAL HYSTERECTOMY    . TUBAL LIGATION     Social History   Social History Narrative  . Not on file   Immunization History  Administered Date(s) Administered  . Influenza,inj,Quad PF,6-35 Mos 06/23/2018  . Moderna SARS-COVID-2 Vaccination 04/19/2019, 05/17/2019     Objective: Vital Signs: BP 96/73 (BP Location: Right Arm, Patient Position: Sitting, Cuff Size: Normal)   Pulse 83   Ht  (1.626 m)   Wt 119 lb (54 kg)   LMP 03/09/2011   BMI 20.43 kg/m    Physical Exam Vitals and nursing note reviewed.  Constitutional:      Appearance: She is well-developed.  HENT:     Head: Normocephalic and atraumatic.  Eyes:     Conjunctiva/sclera: Conjunctivae normal.  Cardiovascular:     Rate and Rhythm: Normal rate and regular rhythm.     Heart sounds: Normal heart sounds.  Pulmonary:     Effort: Pulmonary effort is normal.     Breath sounds: Normal breath sounds.  Abdominal:     General: Bowel sounds are normal.     Palpations: Abdomen is soft.  Musculoskeletal:     Cervical back: Normal range of motion.  Lymphadenopathy:     Cervical: No cervical  adenopathy.  Skin:    General: Skin is warm and dry.     Capillary Refill: Capillary refill takes less than 2 seconds.  Neurological:     Mental Status: She is alert and oriented to person, place, and time.  Psychiatric:        Behavior: Behavior normal.      Musculoskeletal Exam: C-spine thoracic and lumbar spine with good range of motion.  She had levoscoliosis.  She had pain with range of motion of her right shoulder joint and  palpable effusion in her right shoulder.  She has synovitis on palpation of her right of the joint and a contracture.  She has limited range of motion of bilateral wrist joints.  She has synovitis over MCPs and PIPs as described below.  Hip joints and knee joints and good range of motion she.  She had warmth and swelling on palpation of her left ankle joint.  She has swelling and tenderness over her right first MTP joint.  CDAI Exam: CDAI Score: 17.6  Patient Global: 8 mm; Provider Global: 8 mm Swollen: 9 ;  Tender: 11  Joint Exam 07/31/2019      Right  Left  Glenohumeral  Swollen Tender     Elbow  Swollen Tender     Wrist   Tender   Tender  MCP 2  Swollen Tender  Swollen Tender  MCP 3  Swollen Tender  Swollen Tender  PIP 4  Swollen Tender     Ankle     Swollen Tender  MTP 1  Swollen Tender        Investigation: No additional findings.  Imaging: CT Chest High Resolution  Result Date: 07/10/2019 CLINICAL DATA:  Follow-up ground-glass opacities seen at the lung bases on recent CT abdomen study. Current smoker. EXAM: CT CHEST WITHOUT CONTRAST TECHNIQUE: Multidetector CT imaging of the chest was performed following the standard protocol without intravenous contrast. High resolution imaging of the lungs, as well as inspiratory and expiratory imaging, was performed. COMPARISON:  06/12/2019 CT abdomen/pelvis. 01/18/2016 chest CT angiogram. FINDINGS: Cardiovascular: Normal heart size. No significant pericardial effusion/thickening. Great vessels are normal in  course and caliber. Mediastinum/Nodes: No discrete thyroid nodules. Unremarkable esophagus. No pathologically enlarged axillary, mediastinal or hilar lymph nodes, noting limited sensitivity for the detection of hilar adenopathy on this noncontrast study. Lungs/Pleura: No pneumothorax. No pleural effusion. Mild centrilobular emphysema with diffuse bronchial wall thickening. No acute consolidative airspace disease or lung masses. There is moderate patchy tree-in-bud opacity and scattered centrilobular nodularity throughout both lungs with associated mild cylindrical bronchiectasis. Findings are most prominent in the basilar right upper lobe, lingula and right middle lobe, with relative sparing of the lower lobes. These findings are unchanged at the lung bases since 05/23/2019 CT abdomen study. Findings appear mildly progressed in both lungs since 01/18/2016 chest CT study. Dominant solid 6 mm right upper lobe pulmonary nodule (series 13/image 77) and 8 mm solid peripheral right upper lobe pulmonary nodule (series 13/image 82), both new since 01/18/2016 chest CT. No significant lobular air trapping or evidence of tracheobronchomalacia on the expiration sequence. No honeycombing. Upper abdomen: Simple 2.1 cm medial upper left renal cyst. Musculoskeletal:  No aggressive appearing focal osseous lesions. IMPRESSION: 1. 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 Findings appear mildly progressed in both lungs since 01/18/2016 chest CT study. 2. Dominant 8 mm and 6 mm solid right upper lobe pulmonary nodules, new since 01/18/2016 chest CT. While likely inflammatory, follow-up chest CT recommended in 3-6 months in this high risk patient. This recommendation  follows the consensus statement: Guidelines for Management of Incidental Pulmonary Nodules Detected on CT Images: From the Fleischner Society 2017; Radiology 2017; 284:228-243. 3. Emphysema (ICD10-J43.9). Electronically Signed   By: Delbert Phenix M.D.   On: 07/10/2019 16:43   Pulmonary Function Test [facility] Only  Result Date: 07/05/2019 Spirometry Data Is Acceptable and Reproducible Moderate Obstructive Airways Disease without  Significant Broncho-Dilator Response +air trapping Consider outpatient Pulmonary Consultation if needed Clinical Correlation Advised    Recent Labs: Lab Results  Component Value Date   WBC 10.5 06/23/2019   HGB 13.1 06/23/2019   PLT 359 06/23/2019   NA 136 06/11/2019   K 3.8 06/11/2019   CL 106 06/11/2019   CO2 19 (L) 06/11/2019   GLUCOSE 100 (H) 06/11/2019   BUN 5 (L) 06/11/2019   CREATININE 0.80 06/11/2019   BILITOT 0.7 06/11/2019   ALKPHOS  95 06/11/2019   AST 18 06/11/2019   ALT 12 06/11/2019   PROT 7.7 06/11/2019   ALBUMIN 3.7 06/11/2019   CALCIUM 9.2 06/11/2019   GFRAA >60 06/11/2019    Speciality Comments: No specialty comments available.  Procedures:  No procedures performed Allergies: Morphine and related   Assessment / Plan:     Visit Diagnoses: Seropositive rheumatoid arthritis (HCC) - Previously seen at [facility] clinic. RF 85.6, CCP 130, ANA-.  Patient states she was diagnosed with rheumatoid arthritis about 2 years ago.  She took methotrexate for few months and discontinued due to inadequate response.  She took Humira for 2 months and Remicade only 1 infusion.  She has not received any treatment in 1-1/2-year.  She has warmth and swelling in multiple joints.  Effusion was present in her right shoulder joint.  She had right elbow joint contracture.  She has synovitis over several of her MCPs and PIPs as described above.  Chronic right shoulder pain -she has painful range of motion and effusion in her right shoulder.  Plan: XR Shoulder Right.  High  riding humerus was noted.  Pain in right elbow -right elbow contracture was noted.  Plan: XR Elbow 2 Views Right.  No significant narrowing was noted.  Possible  erosion or cystic changes noted.  Pain in both hands -she has limited range of motion of bilateral wrist joints and also incomplete fist formation.  Plan: XR Hand 2 View Right, XR Hand 2 View Left.  X-rays were consistent with rheumatoid arthritis.  No erosive changes were noted.  Pain in left ankle and joints of left foot -she has been full range of motion of her left ankle joint and some warmth and swelling.  Plan: XR Ankle 2 Views Left.  X-ray was unremarkable.  Pain in both feet -she has pain in her bilateral feet.  Plan: XR Foot 2 Views Right, XR Foot 2 Views Left.  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  High risk medication use -she is currently on no medications.  She is taken methotrexate, Humira and Remicade for short duration in the past.  I will obtain labs today to look for any underlying infectious process.  We will not be able to start her on any immunosuppressive agents until she gets clearance from Dr. Marlon Pel.  Bronchiolitis-she is followed by Dr. Marchelle Gearing.  Her most recent high-resolution CT was suspicious of infectious bronchiolitis.  She has bronchoscopy pending.  There was a suspicion of Mycobacterium.  Pulmonary nodules-she has pulmonary nodules.  DDD (degenerative disc disease), lumbar-she has a scoliosis.  She complains of lower back discomfort and right-sided radiculopathy intermittently.   PTSD (post-traumatic stress disorder)  ADD (attention deficit disorder) without hyperactivity  Smoker - Half a pack per day for 10 years.  Smoking cessation was discussed.  Association of smoking with rheumatoid arthritis was also discussed at length.  Orders: Orders Placed This Encounter  Procedures  . XR Hand 2 View Right  . XR Hand 2 View Left  . XR Foot 2 Views Right  . XR Foot 2  Views Left  . XR Ankle 2 Views Left  . XR Elbow 2 Views Right  . XR Shoulder Right  . Sedimentation rate  . CK  . TSH  . Rheumatoid factor  . Cyclic citrul peptide antibody, IgG  . 14-3-3 eta Protein  . ANA  . Hepatitis B core antibody, IgM  . Hepatitis B surface antigen  . Hepatitis C antibody  .  HIV Antibody (routine testing w rflx)  . QuantiFERON-TB Gold Plus  . Serum protein electrophoresis with reflex  . IgG, IgA, IgM   No orders of the defined types were placed in this encounter.     Follow-Up Instructions: Return for Rheumatoid arthritis.   Pollyann Savoy, MD  Note - This record has been created using Animal nutritionist.  Chart creation errors have been sought, but may not always  have been located. Such creation errors do not reflect on  the standard of medical care.

## 2019-07-27 ENCOUNTER — Ambulatory Visit: Payer: Medicaid Other | Admitting: Rheumatology

## 2019-07-31 ENCOUNTER — Ambulatory Visit: Payer: Self-pay

## 2019-07-31 ENCOUNTER — Ambulatory Visit: Payer: Medicaid Other | Admitting: Rheumatology

## 2019-07-31 ENCOUNTER — Other Ambulatory Visit: Payer: Self-pay

## 2019-07-31 ENCOUNTER — Encounter: Payer: Self-pay | Admitting: Rheumatology

## 2019-07-31 ENCOUNTER — Telehealth: Payer: Medicaid Other | Admitting: Nurse Practitioner

## 2019-07-31 VITALS — BP 96/73 | HR 83 | Ht 64.0 in | Wt 119.0 lb

## 2019-07-31 DIAGNOSIS — L249 Irritant contact dermatitis, unspecified cause: Secondary | ICD-10-CM | POA: Diagnosis not present

## 2019-07-31 DIAGNOSIS — M79671 Pain in right foot: Secondary | ICD-10-CM | POA: Diagnosis not present

## 2019-07-31 DIAGNOSIS — R5383 Other fatigue: Secondary | ICD-10-CM

## 2019-07-31 DIAGNOSIS — R918 Other nonspecific abnormal finding of lung field: Secondary | ICD-10-CM | POA: Insufficient documentation

## 2019-07-31 DIAGNOSIS — M5136 Other intervertebral disc degeneration, lumbar region: Secondary | ICD-10-CM

## 2019-07-31 DIAGNOSIS — M25521 Pain in right elbow: Secondary | ICD-10-CM | POA: Diagnosis not present

## 2019-07-31 DIAGNOSIS — F17200 Nicotine dependence, unspecified, uncomplicated: Secondary | ICD-10-CM

## 2019-07-31 DIAGNOSIS — M25572 Pain in left ankle and joints of left foot: Secondary | ICD-10-CM | POA: Diagnosis not present

## 2019-07-31 DIAGNOSIS — M059 Rheumatoid arthritis with rheumatoid factor, unspecified: Secondary | ICD-10-CM | POA: Diagnosis not present

## 2019-07-31 DIAGNOSIS — F988 Other specified behavioral and emotional disorders with onset usually occurring in childhood and adolescence: Secondary | ICD-10-CM

## 2019-07-31 DIAGNOSIS — M79641 Pain in right hand: Secondary | ICD-10-CM | POA: Diagnosis not present

## 2019-07-31 DIAGNOSIS — F4310 Post-traumatic stress disorder, unspecified: Secondary | ICD-10-CM | POA: Insufficient documentation

## 2019-07-31 DIAGNOSIS — M79642 Pain in left hand: Secondary | ICD-10-CM

## 2019-07-31 DIAGNOSIS — G8929 Other chronic pain: Secondary | ICD-10-CM

## 2019-07-31 DIAGNOSIS — Z79899 Other long term (current) drug therapy: Secondary | ICD-10-CM

## 2019-07-31 DIAGNOSIS — M79672 Pain in left foot: Secondary | ICD-10-CM

## 2019-07-31 DIAGNOSIS — M25511 Pain in right shoulder: Secondary | ICD-10-CM

## 2019-07-31 DIAGNOSIS — M51369 Other intervertebral disc degeneration, lumbar region without mention of lumbar back pain or lower extremity pain: Secondary | ICD-10-CM

## 2019-07-31 DIAGNOSIS — J219 Acute bronchiolitis, unspecified: Secondary | ICD-10-CM

## 2019-07-31 MED ORDER — PREDNISONE 10 MG (21) PO TBPK
ORAL_TABLET | ORAL | 0 refills | Status: DC
Start: 2019-07-31 — End: 2019-08-18

## 2019-07-31 NOTE — Progress Notes (Signed)
E Visit for Rash  We are sorry that you are not feeling well. Here is how we plan to help!  Based on what you shared with me it looks like you have contact dermatitis.  Contact dermatitis is a skin rash caused by something that touches the skin and causes irritation or inflammation.  Your skin may be red, swollen, dry, cracked, and itch.  The rash should go away in a few days but can last a few weeks.  If you get a rash, it's important to figure out what caused it so the irritant can be avoided in the future.      Prednisone 10 mg daily for 6 days (see taper instructions below)  Directions for 6 day taper: Day 1: 2 tablets before breakfast, 1 after both lunch & dinner and 2 at bedtime Day 2: 1 tab before breakfast, 1 after both lunch & dinner and 2 at bedtime Day 3: 1 tab at each meal & 1 at bedtime Day 4: 1 tab at breakfast, 1 at lunch, 1 at bedtime Day 5: 1 tab at breakfast & 1 tab at bedtime Day 6: 1 tab at breakfast    HOME CARE:   Take cool showers and avoid direct sunlight.  Apply cool compress or wet dressings.  Take a bath in an oatmeal bath.  Sprinkle content of one Aveeno packet under running faucet with comfortably warm water.  Bathe for 15-20 minutes, 1-2 times daily.  Pat dry with a towel. Do not rub the rash.  Use hydrocortisone cream.  Take an antihistamine like Benadryl for widespread rashes that itch.  The adult dose of Benadryl is 25-50 mg by mouth 4 times daily.  Caution:  This type of medication may cause sleepiness.  Do not drink alcohol, drive, or operate dangerous machinery while taking antihistamines.  Do not take these medications if you have prostate enlargement.  Read package instructions thoroughly on all medications that you take.  GET HELP RIGHT AWAY IF:   Symptoms don't go away after treatment.  Severe itching that persists.  If you rash spreads or swells.  If you rash begins to smell.  If it blisters and opens or develops a yellow-brown  crust.  You develop a fever.  You have a sore throat.  You become short of breath.  MAKE SURE YOU:  Understand these instructions. Will watch your condition. Will get help right away if you are not doing well or get worse.  Thank you for choosing an e-visit. Your e-visit answers were reviewed by a board certified advanced clinical practitioner to complete your personal care plan. Depending upon the condition, your plan could have included both over the counter or prescription medications. Please review your pharmacy choice. Be sure that the pharmacy you have chosen is open so that you can pick up your prescription now.  If there is a problem you may message your provider in MyChart to have the prescription routed to another pharmacy. Your safety is important to us. If you have drug allergies check your prescription carefully.   For the next 24 hours, you can use MyChart to ask questions about today's visit, request a non-urgent call back, or ask for a work or school excuse from your e-visit provider. You will get an email in the next two days asking about your experience. I hope that your e-visit has been valuable and will speed your recovery.    5-10 minutes spent reviewing and documenting in chart.   

## 2019-08-01 ENCOUNTER — Encounter: Payer: Self-pay | Admitting: Rheumatology

## 2019-08-02 NOTE — Progress Notes (Signed)
I will discuss results with a follow-up visit.  Labs are consistent with rheumatoid arthritis.  She cannot start on any medications until she gets clearance from pulmonary.

## 2019-08-04 ENCOUNTER — Other Ambulatory Visit
Admission: RE | Admit: 2019-08-04 | Discharge: 2019-08-04 | Disposition: A | Discharge status: Home / Self Care | Payer: Medicaid Other | Source: Ambulatory Visit | Attending: Internal Medicine | Admitting: Internal Medicine

## 2019-08-04 ENCOUNTER — Telehealth: Payer: Self-pay | Admitting: Internal Medicine

## 2019-08-04 DIAGNOSIS — R768 Other specified abnormal immunological findings in serum: Secondary | ICD-10-CM

## 2019-08-04 DIAGNOSIS — R918 Other nonspecific abnormal finding of lung field: Secondary | ICD-10-CM | POA: Diagnosis present

## 2019-08-04 MED ORDER — BREO ELLIPTA 100-25 MCG/INH IN AEPB: 1.0000 | INHALATION_SPRAY | Freq: Every day | RESPIRATORY_TRACT | 0 refills | Status: AC

## 2019-08-04 MED ORDER — PREDNISONE 10 MG PO TABS
ORAL_TABLET | ORAL | 0 refills | Status: DC
Start: 2019-08-04 — End: 2019-08-18

## 2019-08-04 NOTE — Telephone Encounter (Signed)
Pt stated that it was mentioned by [facility] that he would call to setup a bronch after recent CT.   Pt also reports of chest heaviness, headache and dizziness. Sx have been for 3 days but have increased today.  Sob with baseline.

## 2019-08-04 NOTE — Telephone Encounter (Signed)
  She has 2 issues  1. RA  2. 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 - based on PFT of moderate obsruction,  high IgE and abnromal CT   Plan  - she will need to a RAST blood allergy panel test at some point but if she can do it before the prednisone great but if not can get it in future   - yes she will need bronch but iis not super urgent - just to rule out chronic low grade infectin so she can get RA Rx - I will look at some dates next 2 weeks (can she come to [facility] or Friday afternoon in [place] when I am there?)   - start breo or symbicort or dulera- any 4 weeks sample any dose you have   - Take prednisone 40 mg daily x 2 days, then 20mg  daily x 2 days, then 10mg  daily x 2 days, then 5mg  daily x 2 days and stop   - if symptoms miserable then ER      Current Outpatient Medications:  .  predniSONE (STERAPRED UNI-PAK 21 TAB) 10 MG (21) TBPK tablet, As directed x 6 days, Disp: 21 tablet, Rfl: 0    xxxxxxxxxxxxxxxxxxxxxxxx    PFT - fev1 63% with obstruction     Results for EVANY, SCHECTER (MRN ) as of 08/04/2019 11:55  Ref. Range 07/31/2019 11:28  Anti Nuclear Antibody (ANA) Latest Ref Range: NEGATIVE  POSITIVE (A)  ANA Pattern 1 Unknown Nuclear, Homogeneous (A)  ANA Titer 1 Latest Units: titer 1:40 (H)  Cyclic Citrullin Peptide Ab Latest Units: UNITS 145 (H)  Immunoglobulin A Latest Ref Range: 47 - 310 mg/dL [phone] (H)  RA Latex Turbid. Latest Ref Range: <14 IU/mL 117 (H)  IgG (Immunoglobin G), Serum Latest Ref Range: 600 - 1,640 mg/dL 08/06/2019  IMPRESSION: 1. 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 Findings appear mildly progressed in both lungs since 01/18/2016 chest CT study. 2. Dominant 8 mm  and 6 mm solid right upper lobe pulmonary nodules, new since 01/18/2016 chest CT. While likely inflammatory, follow-up chest CT recommended in 3-6 months in this high risk patient. This recommendation follows the consensus statement: Guidelines for Management of Incidental Pulmonary Nodules Detected on CT Images: From the Fleischner Society 2017; Radiology 2017; 284:228-243. 3. Emphysema (ICD10-J43.9).   Electronically Signed   By: 2018 M.D.   On: 07/10/2019 16:43

## 2019-08-04 NOTE — Telephone Encounter (Signed)
Pt is aware of below message and voiced her understanding.  Rx for prednisone has been sent to preferred pharmacy.  Routing back to MR as requested.

## 2019-08-04 NOTE — Telephone Encounter (Addendum)
Pt is aware of results and voiced her understanding.  Pt is willing to drive to [facility] for bronch. Pt is currently on prednisone taper prescribed by PCP on 07/31/2019. One moNTH supply of Breo 100 has been placed up front for pickup.  RAST allergy test has ben ordered.   Will route to Dr. Marchelle Gearing to advise on prednisone and bronch.

## 2019-08-04 NOTE — Telephone Encounter (Signed)
She is on 6 day  pred taper by PCP for dermatitis started on 7/12. She can just from tomorrow just startr our prednisone taper  If she is feeling bad - consider Er/urgent care visit (note: can be prednisone side effects to consider too)   Pls send note back to me -I will look at dates for bronch

## 2019-08-06 LAB — QUANTIFERON-TB GOLD PLUS
Mitogen-NIL: >10 IU/mL
NIL: 0.05 IU/mL
QuantiFERON-TB Gold Plus: NEGATIVE
TB1-NIL: <0 IU/mL
TB2-NIL: <0 IU/mL

## 2019-08-06 LAB — IGG, IGA, IGM
IgG (Immunoglobin G), Serum: 1349 mg/dL (ref 600–1640)
IgM, Serum: 139 mg/dL (ref 50–300)
Immunoglobulin A: 336 mg/dL — ABNORMAL HIGH (ref 47–310)

## 2019-08-06 LAB — PROTEIN ELECTROPHORESIS, SERUM, WITH REFLEX
Albumin ELP: 3.8 g/dL (ref 3.8–4.8)
Alpha 1: 0.4 g/dL — ABNORMAL HIGH (ref 0.2–0.3)
Alpha 2: 1 g/dL — ABNORMAL HIGH (ref 0.5–0.9)
Beta 2: 0.4 g/dL (ref 0.2–0.5)
Beta Globulin: 0.4 g/dL (ref 0.4–0.6)
Gamma Globulin: 1.2 g/dL (ref 0.8–1.7)
Total Protein: 7.2 g/dL (ref 6.1–8.1)

## 2019-08-06 LAB — ANTI-NUCLEAR AB-TITER (ANA TITER): ANA Titer 1: 1:40 {titer} — ABNORMAL HIGH

## 2019-08-06 LAB — ANA: Anti Nuclear Antibody (ANA): POSITIVE — AB

## 2019-08-06 LAB — RHEUMATOID FACTOR: Rheumatoid fact SerPl-aCnc: 117 IU/mL — ABNORMAL HIGH (ref <14)

## 2019-08-06 LAB — HEPATITIS B CORE ANTIBODY, IGM: Hep B C IgM: NONREACTIVE

## 2019-08-06 LAB — HIV ANTIBODY (ROUTINE TESTING W REFLEX): HIV 1&2 Ab, 4th Generation: NONREACTIVE

## 2019-08-06 LAB — 14-3-3 ETA PROTEIN: 14-3-3 eta Protein: 0.6 ng/mL — ABNORMAL HIGH (ref <0.2)

## 2019-08-06 LAB — HEPATITIS B SURFACE ANTIGEN: Hepatitis B Surface Ag: NONREACTIVE

## 2019-08-06 LAB — SEDIMENTATION RATE: Sed Rate: 51 mm/h — ABNORMAL HIGH (ref 0–20)

## 2019-08-06 LAB — HEPATITIS C ANTIBODY
Hepatitis C Ab: NONREACTIVE
SIGNAL TO CUT-OFF: 0.02 (ref <1.00)

## 2019-08-06 LAB — CYCLIC CITRUL PEPTIDE ANTIBODY, IGG: Cyclic Citrullin Peptide Ab: 145 UNITS — ABNORMAL HIGH

## 2019-08-06 LAB — CK: Total CK: 67 U/L (ref 29–143)

## 2019-08-06 LAB — TSH: TSH: 1.91 mIU/L

## 2019-08-08 ENCOUNTER — Telehealth: Payer: Medicaid Other | Admitting: Emergency Medicine

## 2019-08-08 DIAGNOSIS — M5440 Lumbago with sciatica, unspecified side: Secondary | ICD-10-CM | POA: Diagnosis not present

## 2019-08-08 LAB — ALLERGEN PANEL (27) + IGE
Alternaria Alternata IgE: <0.1 kU/L
Aspergillus Fumigatus IgE: <0.1 kU/L
Bahia Grass IgE: <0.1 kU/L
Bermuda Grass IgE: <0.1 kU/L
Cat Dander IgE: <0.1 kU/L
Cedar, Mountain IgE: <0.1 kU/L
Cladosporium Herbarum IgE: <0.1 kU/L
Cocklebur IgE: <0.1 kU/L
Cockroach, American IgE: <0.1 kU/L
Common Silver Birch IgE: <0.1 kU/L
D Farinae IgE: <0.1 kU/L
D Pteronyssinus IgE: <0.1 kU/L
Dog Dander IgE: <0.1 kU/L
Elm, American IgE: <0.1 kU/L
Hickory, White IgE: <0.1 kU/L
IgE (Immunoglobulin E), Serum: 15 IU/mL (ref 6–495)
Johnson Grass IgE: <0.1 kU/L
Kentucky Bluegrass IgE: <0.1 kU/L
Maple/Box Elder IgE: <0.1 kU/L
Mucor Racemosus IgE: <0.1 kU/L
Oak, White IgE: <0.1 kU/L
Penicillium Chrysogen IgE: <0.1 kU/L
Pigweed, Rough IgE: <0.1 kU/L
Plantain, English IgE: <0.1 kU/L
Ragweed, Short IgE: <0.1 kU/L
Setomelanomma Rostrat: <0.1 kU/L
Timothy Grass IgE: <0.1 kU/L
White Mulberry IgE: <0.1 kU/L

## 2019-08-08 MED ORDER — NAPROXEN 500 MG PO TABS
500.0000 mg | ORAL_TABLET | Freq: Two times a day (BID) | ORAL | 0 refills | Status: DC
Start: 2019-08-08 — End: 2019-08-24

## 2019-08-08 MED ORDER — CYCLOBENZAPRINE HCL 10 MG PO TABS
10.0000 mg | ORAL_TABLET | Freq: Three times a day (TID) | ORAL | 0 refills | Status: DC | PRN
Start: 2019-08-08 — End: 2019-08-24

## 2019-08-08 NOTE — Progress Notes (Signed)
We are sorry that you are not feeling well.  Here is how we plan to help!  Based on what you have shared with me it looks like you mostly have acute back pain.  The symptoms that you are experiencing are nerve related.  The prednisone should help reduce swelling around the nerve.  Continue taking the prednisone.  I would also add the medications below to help with your symptoms. You should also probably be seen by a physical therapist, but you will need to see your primary care doctor to get a referral.  Acute back pain is defined as musculoskeletal pain that can resolve in 1-3 weeks with conservative treatment.  I have prescribed Naprosyn 500 mg take one by mouth twice a day non-steroid anti-inflammatory (NSAID) as well as Flexeril 10 mg every eight hours as needed which is a muscle relaxer  Some patients experience stomach irritation or in increased heartburn with anti-inflammatory drugs.  Please keep in mind that muscle relaxer's can cause fatigue and should not be taken while at work or driving.  Back pain is very common.  The pain often gets better over time.  The cause of back pain is usually not dangerous.  Most people can learn to manage their back pain on their own.  Home Care  Stay active.  Start with short walks on flat ground if you can.  Try to walk farther each day.  Do not sit, drive or stand in one place for more than 30 minutes.  Do not stay in bed.  Do not avoid exercise or work.  Activity can help your back heal faster.  Be careful when you bend or lift an object.  Bend at your knees, keep the object close to you, and do not twist.  Sleep on a firm mattress.  Lie on your side, and bend your knees.  If you lie on your back, put a pillow under your knees.  Only take medicines as told by your doctor.  Put ice on the injured area.  Put ice in a plastic bag  Place a towel between your skin and the bag  Leave the ice on for 15-20 minutes, 3-4 times a day for the first 2-3  days. 210 After that, you can switch between ice and heat packs.  Ask your doctor about back exercises or massage.  Avoid feeling anxious or stressed.  Find good ways to deal with stress, such as exercise.  Get Help Right Way If:  Your pain does not go away with rest or medicine.  Your pain does not go away in 1 week.  You have new problems.  You do not feel well.  The pain spreads into your legs.  You cannot control when you poop (bowel movement) or pee (urinate)  You feel sick to your stomach (nauseous) or throw up (vomit)  You have belly (abdominal) pain.  You feel like you may pass out (faint).  If you develop a fever.  Make Sure you:  Understand these instructions.  Will watch your condition  Will get help right away if you are not doing well or get worse.  Your e-visit answers were reviewed by a board certified advanced clinical practitioner to complete your personal care plan.  Depending on the condition, your plan could have included both over the counter or prescription medications.  If there is a problem please reply  once you have received a response from your provider.  Your safety is important to [place].  If you have drug allergies check your prescription carefully.    You can use MyChart to ask questions about today's visit, request a non-urgent call back, or ask for a work or school excuse for 24 hours related to this e-Visit. If it has been greater than 24 hours you will need to follow up with your provider, or enter a new e-Visit to address those concerns.  You will get an e-mail in the next two days asking about your experience.  I hope that your e-visit has been valuable and will speed your recovery. Thank you for using e-visits.  Approximately 5 minutes was used in reviewing the patient's chart, questionnaire, prescribing medications, and documentation.

## 2019-08-10 ENCOUNTER — Telehealth: Payer: Self-pay | Admitting: Internal Medicine

## 2019-08-10 NOTE — Telephone Encounter (Signed)
Pt is aware of below message and voiced her understanding.  Pt will await call to schedule bronch.  Will route to MR to make him aware that pt has been notified.

## 2019-08-10 NOTE — Telephone Encounter (Signed)
This is a [place] patient with [facility] [place] [id number] -she is willing to do bronchoscopy the [place] long.  I prefer .  xxxxxxxxxxxxxxxxxxxxxxxx   Please schedule the following:  Diagnosis: Rheumatoid Arthritis, interstritial infiltrates, immunesuppression  Procedure: Video bronchocoopy, flexible bronchoscopy with BAL  Envisia Classifer Transbronchial biopsy: /NO Anesthesia: moderate sedationonly Do you need Fluro? no Priority: on  Or before August 18, 2019 Date: on or before July 30 Alternate Date: 1) 7am 08/11/2019; 2) 7am August 14, 2019 Monday; 7am August 16, 2019 wed, 7am August 17, 2019 thu. Thu being last resort  [facility]: [facility] Does patient have OSA? no DM? no Or Latex allergy? no Medication Restriction: none Anticoagulate/Antiplatelet: no Pre-op Labs Ordered: x Imaging request: none      Allergy History:  Allergies  Allergen Reactions  . Morphine And Related     Dropped blood pressure, had to lay patient flat and administer IV    Current Outpatient Medications:  .  cyclobenzaprine (FLEXERIL) 10 MG tablet, Take 1 tablet (10 mg total) by mouth 3 (three) times daily as needed for muscle spasms., Disp: 14 tablet, Rfl: 0 .  naproxen (NAPROSYN) 500 MG tablet, Take 1 tablet (500 mg total) by mouth 2 (two) times daily with a meal., Disp: 14 tablet, Rfl: 0 .  predniSONE (DELTASONE) 10 MG tablet, 4tab x2d, 2tab x2d, 1tab x2d, 0.5tab x2d then stop, Disp: 15 tablet, Rfl: 0 .  predniSONE (STERAPRED UNI-PAK 21 TAB) 10 MG (21) TBPK tablet, As directed x 6 days, Disp: 21 tablet, Rfl: 0   Please coordinate Pre-op COVID Testing   Please let Dr Chase Caller know via reply phone message on Epic  Thank you    SIGNATURE    Dr. Brand Males, M.D., F.C.C.P,  Pulmonary and Critical Care Medicine Staff Physician, [place] Director - Interstitial Lung Disease  Program  Pulmonary [place] at  [place], [place], [address]  Pager: [phone], If no answer or between  15:00h - 7:00h: call [phone] Telephone: [phone]  12:09 PM 08/10/2019

## 2019-08-10 NOTE — Telephone Encounter (Signed)
I called patient on [phone] but left message to call back.  And then I called [phone] somebody else answered this phone.  I did not reveal my identity to this person.  Plan -The other day when I said the IgE was high I misspoke because I was in the ICU and the patient was wanting all the results because of acute symptoms.  The IgE was actually normal.  [I saw the IgG and rated as IgE].  The recent allergy testing is completely normal.  So this is good news.  She does not have complicated asthma such as ABPA   -My apologies for the above confusion  -Nevertheless she has abnormal lung findings on the CT chest..  Could be related to rheumatoid arthritis or other immunosuppression.  Bronchoscopy still indicated.  We will send a note to the procedure pool to arrange for bronchoscopy with lavage.  Let the patient know

## 2019-08-11 NOTE — Telephone Encounter (Signed)
Called and spoke with patient to let them know their Bronch is scheduled for 08/18/2019 with Dr. Marchelle Gearing at 7:30am.  Patient was instructed to arrive at hospital at 6:30. They were instructed to bring someone with them as they will not be able to drive home from procedure. Patient instructed not to have anything to eat or drink after midnight.   Patient's covid screening is scheduled at [facility] for 08/15/2019 at 10:45am  Patient expressed understanding.  Routing to Dr. Marchelle Gearing and Leotis Shames as Lorain Childes

## 2019-08-11 NOTE — Telephone Encounter (Signed)
Bronc schedule noted. Will close message

## 2019-08-15 ENCOUNTER — Other Ambulatory Visit (HOSPITAL_COMMUNITY)
Admission: RE | Admit: 2019-08-15 | Discharge: 2019-08-15 | Disposition: A | Discharge status: Home / Self Care | Payer: Medicaid Other | Source: Ambulatory Visit | Attending: Internal Medicine | Admitting: Internal Medicine

## 2019-08-15 DIAGNOSIS — Z01812 Encounter for preprocedural laboratory examination: Secondary | ICD-10-CM | POA: Diagnosis not present

## 2019-08-15 DIAGNOSIS — Z20822 Contact with and (suspected) exposure to covid-19: Secondary | ICD-10-CM | POA: Insufficient documentation

## 2019-08-15 LAB — SARS CORONAVIRUS 2 (TAT 6-24 HRS): SARS Coronavirus 2: NEGATIVE

## 2019-08-18 ENCOUNTER — Ambulatory Visit (HOSPITAL_COMMUNITY)
Admission: RE | Admit: 2019-08-18 | Discharge: 2019-08-18 | Disposition: A | Discharge status: Home / Self Care | Payer: Medicaid Other | Attending: Internal Medicine | Admitting: Internal Medicine

## 2019-08-18 ENCOUNTER — Encounter (HOSPITAL_COMMUNITY)
Admission: RE | Disposition: A | Discharge status: Home / Self Care | Payer: Self-pay | Source: Home / Self Care | Attending: Internal Medicine

## 2019-08-18 ENCOUNTER — Other Ambulatory Visit: Payer: Self-pay

## 2019-08-18 ENCOUNTER — Telehealth: Payer: Self-pay | Admitting: Internal Medicine

## 2019-08-18 ENCOUNTER — Encounter (HOSPITAL_COMMUNITY): Payer: Self-pay | Admitting: Internal Medicine

## 2019-08-18 DIAGNOSIS — M05719 Rheumatoid arthritis with rheumatoid factor of unspecified shoulder without organ or systems involvement: Secondary | ICD-10-CM | POA: Diagnosis not present

## 2019-08-18 DIAGNOSIS — R918 Other nonspecific abnormal finding of lung field: Secondary | ICD-10-CM

## 2019-08-18 DIAGNOSIS — D849 Immunodeficiency, unspecified: Secondary | ICD-10-CM | POA: Diagnosis not present

## 2019-08-18 DIAGNOSIS — M069 Rheumatoid arthritis, unspecified: Secondary | ICD-10-CM | POA: Diagnosis not present

## 2019-08-18 HISTORY — PX: VIDEO BRONCHOSCOPY: SHX5072

## 2019-08-18 LAB — BODY FLUID CELL COUNT WITH DIFFERENTIAL
Lymphs, Fluid: 5 %
Monocyte-Macrophage-Serous Fluid: 85 % (ref 50–90)
Neutrophil Count, Fluid: 8 % (ref 0–25)
Total Nucleated Cell Count, Fluid: 103 cu mm (ref 0–1000)

## 2019-08-18 SURGERY — BRONCHOSCOPY, WITH FLUOROSCOPY
Anesthesia: Moderate Sedation

## 2019-08-18 MED ORDER — LACTATED RINGERS IV SOLN: INTRAVENOUS | Status: DC

## 2019-08-18 MED ORDER — FENTANYL CITRATE (PF) 100 MCG/2ML IJ SOLN
INTRAMUSCULAR | Status: AC
  Filled 2019-08-18: qty 2

## 2019-08-18 MED ORDER — LIDOCAINE HCL (PF) 1 % IJ SOLN
INTRAMUSCULAR | Status: DC | PRN
  Administered 2019-08-18: 10 mg

## 2019-08-18 MED ORDER — PHENYLEPHRINE HCL 1 % NA SOLN
NASAL | Status: AC
  Filled 2019-08-18: qty 15

## 2019-08-18 MED ORDER — LIDOCAINE HCL URETHRAL/MUCOSAL 2 % EX GEL
CUTANEOUS | Status: DC | PRN
  Administered 2019-08-18: 1

## 2019-08-18 MED ORDER — PHENYLEPHRINE HCL 0.25 % NA SOLN: 1.0000 | Freq: Four times a day (QID) | NASAL | Status: DC | PRN

## 2019-08-18 MED ORDER — LIDOCAINE HCL 1 % IJ SOLN
INTRAMUSCULAR | Status: AC
  Filled 2019-08-18: qty 1

## 2019-08-18 MED ORDER — BUTAMBEN-TETRACAINE-BENZOCAINE 2-2-14 % EX AERO: 1.0000 | INHALATION_SPRAY | Freq: Once | CUTANEOUS | Status: DC

## 2019-08-18 MED ORDER — LIDOCAINE HCL URETHRAL/MUCOSAL 2 % EX GEL: 1.0000 "application " | Freq: Once | CUTANEOUS | Status: DC

## 2019-08-18 MED ORDER — FENTANYL CITRATE (PF) 100 MCG/2ML IJ SOLN
INTRAMUSCULAR | Status: DC | PRN
  Administered 2019-08-18: 25 ug via INTRAVENOUS
  Administered 2019-08-18: 50 ug via INTRAVENOUS
  Administered 2019-08-18 (×2): 25 ug via INTRAVENOUS
  Administered 2019-08-18: 50 ug via INTRAVENOUS
  Administered 2019-08-18: 25 ug via INTRAVENOUS

## 2019-08-18 MED ORDER — NICOTINE 21-14-7 MG/24HR TD KIT: PACK | TRANSDERMAL | 0 refills | Status: DC

## 2019-08-18 MED ORDER — MIDAZOLAM HCL (PF) 10 MG/2ML IJ SOLN
INTRAMUSCULAR | Status: DC | PRN
  Administered 2019-08-18: 2 mg via INTRAVENOUS
  Administered 2019-08-18: 1 mg via INTRAVENOUS
  Administered 2019-08-18: 2 mg via INTRAVENOUS

## 2019-08-18 MED ORDER — LIDOCAINE HCL URETHRAL/MUCOSAL 2 % EX GEL
CUTANEOUS | Status: AC
  Filled 2019-08-18: qty 30

## 2019-08-18 MED ORDER — MIDAZOLAM HCL (PF) 5 MG/ML IJ SOLN
INTRAMUSCULAR | Status: AC
  Filled 2019-08-18: qty 2

## 2019-08-18 MED ORDER — PHENYLEPHRINE HCL 0.5 % NA SOLN
NASAL | Status: DC | PRN
  Administered 2019-08-18: 1 [drp] via NASAL

## 2019-08-18 MED ORDER — VARENICLINE TARTRATE 0.5 MG PO TABS
ORAL_TABLET | ORAL | 0 refills | Status: DC
Start: 2019-08-18 — End: 2019-08-24

## 2019-08-18 NOTE — Discharge Instructions (Signed)
Please have someone to drive you home Please be careful with activities for next 24 hours You can eat 2-4 hours after getting home provided you are fully alert, able to cough, and are not nauseated or vomiting and feel well You are expected to have low grade fever or cough some amount of blood for next 24-48 hours; if this worsens call us IF you are very short of breath or coughing blood or chest pain or not feeling well, call us [phone] anytime or go to emergency room Please call [phone]  for followup appointment or we will call you and fix one

## 2019-08-18 NOTE — Telephone Encounter (Addendum)
Rx for chantix and nicotine patches has been sent to preferred pharmacy. Pt is aware and voiced her understanding.  Pt will call back to schedule ov's.

## 2019-08-18 NOTE — Op Note (Signed)
Name:  [name] MRN:  [id number] DOB:  May 31, 1975  PROCEDURE NOTE  Procedure(s): Flexible bronchoscopy [phone]) Bronchial alveolar lavage [phone]) of the Right middle lobe   Indications:  Rheumatoid arthritis, immune suppressed, pulmonary infitltraets  Consent:  Procedure, benefits, risks and alternatives discussed.  Questions answered.  Consent obtained.  Anesthesia:  Moderate Sedation  Location: [facility]  Procedure summary:  Appropriate equipment was assembled.  The patient was brought to the procedure suite room and identified as Linda Calderon with [birth year]  Safety timeout was performed. The patient was placed supine on the  table, airway and moderate sedation administered by certiried RN  After the appropriate level of moderation was assured, flexible video bronchoscope was lubricated and inserted through the endotracheal tube.   1% Lidocaine were administered through the bronchoscope to augment moderate sedation  Airway examination was uyes performed bilaterally to subsegmental level.  Mucus  secretions were noted. Once aspirated mucosa appeared normal and no endobronchial lesions were identified.  Bronchial alveolar lavage of the Right Middle Lobe was performed with 20 mL of normal saline  X 1 number of times suctioned out and then 75m x 3 for total volume of 120 mL. Total return of 50 mL of fluid which was pinkish grey cloudy mucoid and abnormal suctioned to  Leukens trap, after which the bronchoscope was withdrawn.   After hemostasis was assure, the bronchoscope was withdrawn.  The patient was recovered and then  transferred to recovery area  Post-procedure chest x-ray was NOT ordered.  Specimens sent: Bronchial alveolar lavage specimen of the Right Middle Lobe for cell count, microbiology and cytology.  Complications:  No immediate complications were noted.  Hemodynamic parameters and oxygenation remained stable throughout the  procedure.  Estimated blood loss:  NONE  IMPRESSION 1. Normal airway but has significant mucus 2. Right Middle Lobe BAL with abnormal color returns   Followup Future Appointments  Date Time Provider DHarleigh 08/24/2019  3:30 PM DBo Merino MD [facility] None   Will give followup with APP in few weeks to discuss resuls. She also wants to quit smoking   Dr. MBrand Males M.D., FShadelands Advanced Endoscopy Institute IncC.P Pulmonary and Critical Care Medicine Staff Physician CComfortPulmonary and Critical Care Pager: [phone] If no answer or between  15:00h - 7:00h: call [phone]  08/18/2019 8:21 AM

## 2019-08-18 NOTE — Telephone Encounter (Signed)
Linda  Calderon Linda Calderon wants to quit smoking. So senr order for nicotine patch. Also p[re bronch I d/w her about chantix and she wants to try. She has no psych history or guns or violence at home  #SMoking  - glad you want to quit and I understand you are using expired samples - start nicotine patch -  start chantix as directed  - Days 1-3: 0.5 mg once daily  - Days 4-7: 0.5 mg twice daily  - Maintenance (? Day 8):  1 mg twice daily for 11 weeks   - if you have bad dream stop medication and call us  - take the medicaiton as instructed and after food - quit smoking  1 week after starting chantix    # given her followup telep/virstual with app in 3 weeks to discuss results and in 6-9 weeks with me in [place]

## 2019-08-18 NOTE — H&P (Signed)
  Pre procedure H&P ALLEIGH MOLLICA  [birth year]   Patient presents for bronchoscopy with bronchoalveolar lavage.  She has rheumatoid arthritis that is now confirmed.  I saw her early June 2021.  She had a history of rheumatoid arthritis.  She is immunosuppressed.  We did a CT scan of the chest shows pulmonary infiltrates.  Since then she has seen rheumatologist was confirmed rheumatoid arthritis.  She said joint deformities.  There is indication to increase immunosuppression but there is concern that the pulmonary infiltrates might represent opportunistic infection.  Therefore she is presented for bronchoscopy.  The plan is to undergo bronchoscopy with lavage using video bronchoscopy flexible bronchoscopy.  If there is any endobronchial lesion she will have endobronchial biopsy.  In the interim there are no changes to her history.  She confirms NPO status denies any active complaints.  She expresses some mild anxiety preprocedure but otherwise is well.   Her physical exam today is nonfocal.  She has some rheumatoid arthritis deformities of her finger.  Lungs are clear to auscultation normal heart sounds.  Alert and oriented x3 abdomen soft no rash.  IMPRESSION: 1. 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 Findings appear mildly progressed in both lungs since 01/18/2016 chest CT study. 2. Dominant 8 mm and 6 mm solid right upper lobe pulmonary nodules, new since 01/18/2016 chest CT. While likely inflammatory, follow-up chest CT recommended in 3-6 months in this high risk patient. This recommendation follows the consensus statement: Guidelines for Management of Incidental Pulmonary Nodules Detected on CT Images: From the  Fleischner Society 2017; Radiology 2017; 284:228-243. 3. Emphysema (ICD10-J43.9).   Electronically Signed   By: Ilona Sorrel M.D.   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 Benefits of diagnosis but limitations of non-diagnosis also explained. Patient verbalized understanding and wished to proceed.       SIGNATURE    Dr. Brand Males, M.D., F.C.C.P,  Pulmonary and Critical Care Medicine Staff Physician, [place] Director - Interstitial Lung Disease  Program  Pulmonary [place] at [place], [place], [address]  Pager: [phone], If no answer  OR between  19:00-7:00h: page [phone] Telephone (clinical office): [phone] Telephone (research): [phone]  7:44 AM 08/18/2019

## 2019-08-19 LAB — PNEUMOCYSTIS JIROVECI SMEAR BY DFA: Pneumocystis jiroveci Ag: NEGATIVE

## 2019-08-20 LAB — ACID FAST SMEAR (AFB, MYCOBACTERIA): Acid Fast Smear: NEGATIVE

## 2019-08-20 LAB — CULTURE, RESPIRATORY W GRAM STAIN: Culture: NORMAL

## 2019-08-21 ENCOUNTER — Encounter (HOSPITAL_COMMUNITY): Payer: Self-pay | Admitting: Internal Medicine

## 2019-08-21 LAB — CYTOLOGY - NON PAP

## 2019-08-21 NOTE — Progress Notes (Signed)
Office Visit Note  Patient: Linda Calderon             Date of Birth: 1975-01-24           MRN: [id number]             PCP: [facility] Referring: [facility]* Visit Date: 08/24/2019 Occupation: _0 @  Subjective:  Pain and discomfort in multiple joints.   History of Present Illness: Linda Calderon is a 44 y.o. female with history of seropositive erosive rheumatoid arthritis.  She states she continues to have pain and discomfort in almost all of her joints.  She complains of discomfort in the bilateral shoulders, bilateral wrist joints in her hands.  She has discomfort in her hip joints knee joints ankles and her feet.  She has noticed intermittent swelling in her hands and her feet.  She recently had bronchoscopic alveolar lavage by Dr. Chase Caller and the results are pending.  He recommended not to start align intermittent suppressive agents and results are back.  She states she was taking Naprosyn which was helping to some extent.  Now she ran out of her Naprosyn.    Activities of Daily Living:  Patient reports morning stiffness for 24 hours.   Patient Reports nocturnal pain.  Difficulty dressing/grooming: Reports Difficulty climbing stairs: Reports Difficulty getting out of chair: Reports Difficulty using hands for taps, buttons, cutlery, and/or writing: Denies  Review of Systems  Constitutional: Positive for fatigue.  HENT: Positive for mouth dryness. Negative for mouth sores and nose dryness.   Eyes: Positive for visual disturbance and dryness. Negative for pain and itching.  Respiratory: Positive for shortness of breath and wheezing. Negative for cough and difficulty breathing.   Cardiovascular: Positive for chest pain. Negative for palpitations.  Gastrointestinal: Negative for blood in stool, constipation and diarrhea.  Endocrine: Positive for excessive thirst. Negative for increased urination.  Genitourinary: Negative for difficulty  urinating.  Musculoskeletal: Positive for arthralgias, joint pain, joint swelling, myalgias, muscle weakness, morning stiffness, muscle tenderness and myalgias.  Skin: Positive for color change, rash, redness and sensitivity to sunlight.  Allergic/Immunologic: Negative for susceptible to infections.  Neurological: Positive for dizziness, numbness, headaches and weakness. Negative for memory loss.  Hematological: Positive for bruising/bleeding tendency.  Psychiatric/Behavioral: Positive for depressed mood and sleep disturbance. Negative for suicidal ideas and confusion. The patient is nervous/anxious.     PMFS History:  Patient Active Problem List   Diagnosis Date Noted  . Rheumatoid arthritis involving shoulder with positive rheumatoid factor ([place])   . Pulmonary infiltrates 07/31/2019  . Bronchiolitis 07/31/2019  . DDD (degenerative disc disease), lumbar 07/31/2019  . PTSD (post-traumatic stress disorder) 07/31/2019  . 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 Smoker 07/31/2019    Past Medical History:  Diagnosis Date  . ADD (attention deficit disorder)   . Chronic neck and back pain   . Pneumonia   . PTSD (post-traumatic stress disorder)   . Radiculopathy   . UTI (lower urinary tract infection)     Family History  Problem Relation Age of Onset  . Hypertension Mother   . Hypertension Father   . Cancer Father    Past Surgical History:  Procedure Laterality Date  . ABDOMINAL HYSTERECTOMY    . TUBAL LIGATION    . VIDEO BRONCHOSCOPY N/A 08/18/2019   Procedure: FLEXIBLE BRONCHOSCOPY WITH BRONCHOALVEOLAR LAVAGE;  Surgeon: Brand Males, MD;  Location: [facility];  Service: Cardiopulmonary;  Laterality: N/A;  Social History   Social History Narrative  . Not on file   Immunization History  Administered Date(s) Administered  . Influenza,inj,Quad PF,6-35 Mos 06/23/2018  . Moderna SARS-COVID-2 Vaccination 04/19/2019, 05/17/2019      Objective: Vital Signs: BP 114/83 (BP Location: Right Arm, Patient Position: Sitting, Cuff Size: Normal)   Pulse 97   Resp 14   Ht _0  (1.626 m)   Wt 116 lb 9.6 oz (52.9 kg)   LMP 03/09/2011   BMI 20.01 kg/m    Physical Exam Vitals and nursing note reviewed.  Constitutional:      Appearance: She is well-developed.  HENT:     Head: Normocephalic and atraumatic.  Eyes:     Conjunctiva/sclera: Conjunctivae normal.  Cardiovascular:     Rate and Rhythm: Normal rate and regular rhythm.     Heart sounds: Normal heart sounds.  Pulmonary:     Effort: Pulmonary effort is normal.     Breath sounds: Normal breath sounds.  Abdominal:     General: Bowel sounds are normal.     Palpations: Abdomen is soft.  Musculoskeletal:     Cervical back: Normal range of motion.  Lymphadenopathy:     Cervical: No cervical adenopathy.  Skin:    General: Skin is warm and dry.     Capillary Refill: Capillary refill takes less than 2 seconds.  Neurological:     Mental Status: She is alert and oriented to person, place, and time.  Psychiatric:        Behavior: Behavior normal.      Musculoskeletal Exam: Discomfort range of motion of her cervical spine.  She has painful range of motion of bilateral shoulders.  She had tenderness on palpation of her right shoulder.  She had tenderness on palpation of her right elbow joint.  She has synovitis in some of the joints as described below.  She has good range of motion of her hip joints and knee joints.  She is contracture in her right fourth PIP joint.  CDAI Exam: CDAI Score: 14.6  Patient Global: 8 mm; Provider Global: 8 mm Swollen: 5 ; Tender: 10  Joint Exam 08/24/2019      Right  Left  Glenohumeral   Tender     Elbow   Tender     Wrist   Tender   Tender  MCP 2  Swollen Tender  Swollen Tender  MCP 3   Tender  Swollen Tender  PIP 4  Swollen Tender     Ankle     Swollen Tender     Investigation: No additional findings.  Imaging: XR Ankle  2 Views Left  Result Date: 07/31/2019 No tibiotalar or subtalar joint space narrowing was noted.  No erosive changes were noted. Impression: Unremarkable x-ray of the ankle joint.  XR Elbow 2 Views Right  Result Date: 07/31/2019 No significant joint space narrowing was noted.  Possible erosion was noted over the olecranon process. Impression: This findings are consistent with rheumatoid arthritis.  XR Foot 2 Views Left  Result Date: 07/31/2019 First MTP, PIP and DIP narrowing was noted.  No intertarsal or tibiotalar joint space narrowing was noted.  No subtalar joint space narrowing was noted.  No erosive changes were noted. Impression: These findings are consistent with osteoarthritis of the foot.  XR Foot 2 Views Right  Result Date: 07/31/2019 First MTP, PIP and DIP narrowing was noted.  No intertarsal tibiotalar joint space narrowing was noted.  No subtalar joint space narrowing was noted.  Impression: These findings are consistent with osteoarthritis of the foot.  XR Hand 2 View Left  Result Date: 07/31/2019 Juxta-articular osteopenia was noted.  PIP narrowing was noted.  Metacarpocarpal, intercarpal and radiocarpal joint space narrowing was noted.  No erosive changes were noted. Impression: These findings are consistent with rheumatoid arthritis of the hand.  XR Hand 2 View Right  Result Date: 07/31/2019 Juxta-articular osteopenia was noted.  PIP narrowing was noted.  Significant narrowing of the fourth PIP joint was noted.  No MCP narrowing or erosive changes were noted.  Mild intercarpal and radiocarpal joint space narrowing was noted. Impression: This findings are consistent with rheumatoid arthritis.  XR Shoulder Right  Result Date: 07/31/2019 High rising humerus was noted.  No erosive changes were noted.  No acromioclavicular joint arthritis was noted. Impression: High riding humerus was noted.   Recent Labs: Lab Results  Component Value Date   WBC 10.5 06/23/2019   HGB 13.1  06/23/2019   PLT 359 06/23/2019   NA 136 06/11/2019   K 3.8 06/11/2019   CL 106 06/11/2019   CO2 19 (L) 06/11/2019   GLUCOSE 100 (H) 06/11/2019   BUN 5 (L) 06/11/2019   CREATININE 0.80 06/11/2019   BILITOT 0.7 06/11/2019   ALKPHOS 95 06/11/2019   AST 18 06/11/2019   ALT 12 06/11/2019   PROT 7.2 07/31/2019   ALBUMIN 3.7 06/11/2019   CALCIUM 9.2 06/11/2019   GFRAA >60 06/11/2019   QFTBGOLDPLUS NEGATIVE 07/31/2019  July 31, 2019 SPEP negative, TB Gold negative, IgA mildly elevated, hepatitis B-, hepatitis C negative, HIV negative, ANA 1: 40 homogeneous, ESR 51, CK 67, TSH normal, RF 117, anti-CCP 145, _0 at 0.6  Speciality Comments: No specialty comments available.  Procedures:  No procedures performed Allergies: Morphine and related   Assessment / Plan:     Visit Diagnoses: Seropositive rheumatoid arthritis ([place]) - Positive RF, positive anti-CCP, +_1 eta, erosive disease with synovitis involving multiple join.  Linda Calderon was diagnosed with rheumatoid arthritis 2 years ago.  She was treated with Humira and Remicade for only a few months and had a very good response per patient.  She has had no treatment for the last 2 years.  We are holding treatment right now as she had recent work-up for bronchiolitis and all the cultures are pending.  She requested naproxen scription.  Indications side effects contraindications were discussed at length including the risk of GI bleed, hepatorenal toxicity.  Have given a prescription for Naprosyn 500 mg p.o. twice daily to be taken with meals.  All the lab values were discussed with the patient.  She has elevated sedimentation rate.  She has low titer ANA.  She was concerned if she has lupus.  She has no clinical features of lupus.  ANA titer is very low and not significant.  Chronic right shoulder pain-she has ongoing discomfort.  Pain in right elbow - Possible olecranon erosion  Pain in both hands-she has pain and discomfort in her bilateral  hands.  Pain in left ankle and joints of left foot-he has discomfort in left ankle and left foot.  High risk medication use - MTX-inadequate response, Humirax2 mths.-Inadequate response,RemicadeX1 infusion.  No treatment since 2019.  Needs clearance-Dr. Red Christians & Dr. Chase Caller.  Patient is fully vaccinated.  Have advised her to continue to use mask, practice social distancing and hand hygiene.  If she develops COVID-19 infection she will need antibody infusion.  She should also get posttreatment available.  DDD (  degenerative disc disease), lumbar - She has intermittent right-sided radiculopathy.  Bronchiolitis - She recently had BAL by Dr. Chase Caller.  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  I reviewed records from Dr. Golden Pop office visit.  We will hold off any DMARDs or Biologics at this point.  Pulmonary nodules - Followed by Dr. Chase Caller.  Smoker - Half a pack for the last 10 years.  Trying smoking cessation.  PTSD (post-traumatic stress disorder)  ADD (attention deficit disorder) without hyperactivity  Orders: No orders of the defined types were placed in this encounter.  Meds ordered this encounter  Medications  . naproxen (NAPROSYN) 500 MG tablet    Sig: Take 1 tablet (500 mg total) by mouth 2 (two) times daily with a meal.    Dispense:  60 tablet    Refill:  0     Follow-Up Instructions: Return in about 6 weeks (around 10/05/2019) for Rheumatoid arthritis.   Bo Merino, MD  Note - This record has been created using Editor, commissioning.  Chart creation errors have been sought, but may not always  have been located. Such creation errors do not reflect on  the standard of medical care.

## 2019-08-22 LAB — MTB RIF NAA NON-SPUTUM, W/O CULTURE

## 2019-08-22 NOTE — Telephone Encounter (Signed)
Lm to schedule OV °

## 2019-08-24 ENCOUNTER — Telehealth: Payer: Self-pay | Admitting: Internal Medicine

## 2019-08-24 ENCOUNTER — Ambulatory Visit (INDEPENDENT_AMBULATORY_CARE_PROVIDER_SITE_OTHER): Payer: Medicaid Other | Admitting: Rheumatology

## 2019-08-24 ENCOUNTER — Other Ambulatory Visit: Payer: Self-pay

## 2019-08-24 ENCOUNTER — Encounter: Payer: Self-pay | Admitting: Rheumatology

## 2019-08-24 VITALS — BP 114/83 | HR 97 | Resp 14 | Ht 64.0 in | Wt 116.6 lb

## 2019-08-24 DIAGNOSIS — M5136 Other intervertebral disc degeneration, lumbar region: Secondary | ICD-10-CM

## 2019-08-24 DIAGNOSIS — Z79899 Other long term (current) drug therapy: Secondary | ICD-10-CM

## 2019-08-24 DIAGNOSIS — M79642 Pain in left hand: Secondary | ICD-10-CM

## 2019-08-24 DIAGNOSIS — M79641 Pain in right hand: Secondary | ICD-10-CM | POA: Diagnosis not present

## 2019-08-24 DIAGNOSIS — M059 Rheumatoid arthritis with rheumatoid factor, unspecified: Secondary | ICD-10-CM

## 2019-08-24 DIAGNOSIS — M25511 Pain in right shoulder: Secondary | ICD-10-CM

## 2019-08-24 DIAGNOSIS — M25572 Pain in left ankle and joints of left foot: Secondary | ICD-10-CM

## 2019-08-24 DIAGNOSIS — J219 Acute bronchiolitis, unspecified: Secondary | ICD-10-CM

## 2019-08-24 DIAGNOSIS — G8929 Other chronic pain: Secondary | ICD-10-CM

## 2019-08-24 DIAGNOSIS — M25521 Pain in right elbow: Secondary | ICD-10-CM

## 2019-08-24 DIAGNOSIS — F988 Other specified behavioral and emotional disorders with onset usually occurring in childhood and adolescence: Secondary | ICD-10-CM

## 2019-08-24 DIAGNOSIS — F4310 Post-traumatic stress disorder, unspecified: Secondary | ICD-10-CM

## 2019-08-24 DIAGNOSIS — F17200 Nicotine dependence, unspecified, uncomplicated: Secondary | ICD-10-CM

## 2019-08-24 DIAGNOSIS — R918 Other nonspecific abnormal finding of lung field: Secondary | ICD-10-CM

## 2019-08-24 MED ORDER — NAPROXEN 500 MG PO TABS: 500.0000 mg | ORAL_TABLET | Freq: Two times a day (BID) | ORAL | 0 refills | Status: DC

## 2019-08-24 NOTE — Telephone Encounter (Signed)
Thank you :)

## 2019-08-24 NOTE — Telephone Encounter (Signed)
Let Linda Calderon know that culture results still in progress. Cell count shows > 85% macrophage - suggesting that pulmonary infiltrates can be smoking related. We already called in nicotine patch and chantix. She needs to set quit date  There is some yeast seen on smear in the lavage but could be contaminant and fungal culture takes weeks. Got to wait. She needs to be patient  Also give followup to see me or an app  - even video visit is fine  Mid-end august 2021 to go over results  Till then no immune suppression due to  Yeast on bal smear  Sending to Dr Estanislado Pandy and Lesleigh Noe CMA in [place] clinic

## 2019-08-24 NOTE — Telephone Encounter (Signed)
Please see 08/24/2019 phone note.

## 2019-08-24 NOTE — Patient Instructions (Signed)
   COVID-19 vaccine recommendations:   COVID-19 vaccine is recommended for everyone (unless you are allergic to a vaccine component), even if you are on a medication that suppresses your immune system.   If you are on Methotrexate, Cellcept (mycophenolate), Rinvoq, Xeljanz, and Olumiant- hold the medication for 1 week after each vaccine. Hold Methotrexate for 2 weeks after the single dose COVID-19 vaccine.   If you are on Orencia subcutaneous injection - hold medication one week prior to and one week after the first COVID-19 vaccine dose (only).   If you are on Orencia IV infusions- time vaccination administration so that the first COVID-19 vaccination will occur four weeks after the infusion and postpone the subsequent infusion by one week.   If you are on Cyclophosphamide or Rituxan infusions please contact your doctor prior to receiving the COVID-19 vaccine.   Do not take Tylenol or ant anti-inflammatory medications (NSAIDs) 24 hours prior to the COVID-19 vaccination.   There is no direct evidence about the efficacy of the COVID-19 vaccine in individuals who are on medications that suppress the immune system.   Even if you are fully vaccinated, and you are on any medications that suppress your immune system, please continue to wear a mask, maintain at least six feet social distance and practice hand hygiene.   If you develop a COVID-19 infection, please contact your PCP or our office to determine if you need antibody infusion.  We anticipate that a booster vaccine will be available soon for immunosuppressed individuals. Please cal our office before receiving your booster dose to make adjustments to your medication regimen.  

## 2019-08-24 NOTE — Telephone Encounter (Signed)
Lm for pt

## 2019-08-25 ENCOUNTER — Telehealth: Payer: Self-pay | Admitting: Internal Medicine

## 2019-08-25 MED ORDER — BREO ELLIPTA 100-25 MCG/INH IN AEPB: 1.0000 | INHALATION_SPRAY | Freq: Every day | RESPIRATORY_TRACT | 5 refills | Status: AC

## 2019-08-25 NOTE — Telephone Encounter (Signed)
Patient can have a refill of Breo but nowhere does it say maintenance dose of prednisone in fact his last result note says no immunosuppression so she will need to discuss this at the follow-up  Please contact office for sooner follow up if symptoms do not improve or worsen or seek emergency care

## 2019-08-25 NOTE — Telephone Encounter (Signed)
Tammy, please advise on alternatives, as MR is unavailable. Thanks

## 2019-08-25 NOTE — Telephone Encounter (Signed)
Called and spoke to pt and relayed below recommendations.  Patient stated that she spoke with [facility], who stated that they have 39 tablets of chantix in stock that they will give her and will fill the rest of the prescription once more comes in.   Pt has been provided with [facility] contact number.  Nothing further is needed at this time.

## 2019-08-25 NOTE — Telephone Encounter (Signed)
Pt is aware of below message and voiced her understanding. Nothing further is needed at this time.  

## 2019-08-25 NOTE — Telephone Encounter (Signed)
Please give her the phone number for quit smoking. If she is not allergic can try nicotine patches otherwise we do not have any other alternatives for Chantix Can discuss on return visit with Dr. Marchelle Gearing and or seek out to her primary care provider.

## 2019-08-25 NOTE — Telephone Encounter (Signed)
Please refer to 08/04/2019 phone note.  Pt is requesting a refill on Breo and prednisone. Pt stated that it was mentioned by Dr. Marchelle Gearing that she would be on a maintenance dose of prednisone. I do not see this mentioned within last OV note. Last Rx of prednisone was for a taper.  Refills on Breo have been sent to preferred pharmacy.  Tammy, please advise on prednisone, as MR is currently unavailable. Thanks

## 2019-08-25 NOTE — Telephone Encounter (Signed)
Lm x2 for pt.  

## 2019-08-25 NOTE — Telephone Encounter (Signed)
Pt is aware of results and voiced her understanding.  Phone visit has been scheduled for 09/18/2019 at 9:15 with MR.  Chantix is currently being recalled. Dr. Marchelle Gearing please advise with alternative. Thanks

## 2019-08-30 MED ORDER — FLUCONAZOLE 100 MG PO TABS
ORAL_TABLET | ORAL | 0 refills | Status: DC
Start: 2019-08-30 — End: 2020-05-24

## 2019-08-30 NOTE — Telephone Encounter (Signed)
Patient is aware of results and voiced her understanding.  Rx for Fluconazole has been sent to preferred pharmacy.  Nothing further is needed at this time.

## 2019-08-30 NOTE — Telephone Encounter (Signed)
BAL has grown yeast candida tropicalis. D/w ID and by my own assessment this is likely a contaminant and the most likely reason for infiltrates in lung is smoking. But because she was immune sppressed and because rheumatologist might want to try something stronger  Plan  - fluconazole 200 mg loading dose, followed by 100 mg daily for 13 more days

## 2019-08-31 ENCOUNTER — Ambulatory Visit: Payer: Medicaid Other | Admitting: Rheumatology

## 2019-09-05 ENCOUNTER — Telehealth: Payer: Self-pay | Admitting: Internal Medicine

## 2019-09-05 MED ORDER — DOXYCYCLINE HYCLATE 100 MG PO TABS
100.0000 mg | ORAL_TABLET | Freq: Two times a day (BID) | ORAL | 0 refills | Status: DC
Start: 2019-09-05 — End: 2020-05-24

## 2019-09-05 NOTE — Telephone Encounter (Signed)
ATC patient, reached VM.  Left instructions per Dr. Marchelle Gearing.  Unable to ask questions that Dr. Marchelle Gearing wanted to know d/t no answer. Prescription for Doxycycline was sent to the pharmacy. Nothing further needed.

## 2019-09-05 NOTE — Telephone Encounter (Addendum)
Called and spoke to patient. Patient reports of productive cough with yellowish mucus, head congestion, runny nose clear in color, low grade of 100.8 and voice hoariness x5d. Patient taking severe cold and flu with some relief in symptoms.  Denied chills or sweats. No known sick contacts.  Per patient, she has had both covid vaccines.   Dr. Marchelle Gearing, please advise. Thanks

## 2019-09-05 NOTE — Telephone Encounter (Signed)
Patient stated that she has cut back on smoking. She is currently at 6 cigarettes daily.  She is still taking Diflucan. She is unsure how many tablets she has left.  Rx for doxy has been sent to preferred pharmacy.  [facility] to [facility] for cov[facility] testing.  Nothing further is needed at this time.  Will route to MR as an [place]

## 2019-09-05 NOTE — Telephone Encounter (Signed)
Has she quti smoking? IS she still on diflucan?   Should get COVID pcr test ASAP  Take doxycycline 100mg  po twice daily x 5 days; take after meals and avoid sunlight  If worse go to ER  .

## 2019-09-06 NOTE — Telephone Encounter (Signed)
Ok thanks 

## 2019-09-18 ENCOUNTER — Ambulatory Visit (INDEPENDENT_AMBULATORY_CARE_PROVIDER_SITE_OTHER): Payer: Medicaid Other | Admitting: Internal Medicine

## 2019-09-18 DIAGNOSIS — R768 Other specified abnormal immunological findings in serum: Secondary | ICD-10-CM

## 2019-09-18 DIAGNOSIS — R918 Other nonspecific abnormal finding of lung field: Secondary | ICD-10-CM

## 2019-09-18 DIAGNOSIS — F17200 Nicotine dependence, unspecified, uncomplicated: Secondary | ICD-10-CM

## 2019-09-18 NOTE — Patient Instructions (Signed)
ICD-10-CM   1. Rheumatoid factor positive  R76.8   2. Pulmonary infiltrates  R91.8   3. Smoking  F17.200     Reschedule visit in BRL inext 1-2 months

## 2019-09-18 NOTE — Progress Notes (Signed)
OV 06/23/2019  Subjective:  Patient ID: Linda Calderon, female , DOB: 02/15/1975 , age 44 y.o. , MRN: [id number] , ADDRESS: [address] [place] [address]  PCP [facility] -Dr. Georgie Chard [previously]  06/23/2019 -   Chief Complaint  Patient presents with  . Consult    Patient had Ct scan done and was told she has nodules on her lungs. Patient is not having shortness of breath right now states that when she went to the ED she was having shortness of breath and pain but it has gone away. Dry cough in morning     HPI Linda Calderon 44 y.o. -she is a cousin of [name] respiratory therapist.  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  She has a diagnosis of seropositive rheumatoid arthritis, CCP positive rheumatoid arthritis with manifestation bilateral shoulders, bilateral elbows hands and feet.  Diagnosis was in March 2019.  Symptoms started in January 2019.  She was on methotrexate along with prednisone.  Review of the chart indicates in 2020 February Remicade was added but approximately 9 months ago she stopped this because she is got worried that there is too much switching of rheumatoid arthritis medications going on.  Since then her joint pain particularly in her right hand and wrist is worse.  She also has early morning stiffness.  She has developed some deformities in one of the right finger and one of the left fingers.  In the context of all this she has not had any respiratory issues.  Specifically no shortness of breath no wheezing.  No orthopnea no proximal nocturnal dyspnea.  No hemoptysis.  She developed some abdominal pain in May 2021 ended up in the ER. She had a CT abdomen Jun 12, 2019.  This is compared to a CT chest in 2017.  The lung images on this show chronic nodular infiltrates in the middle lobe and lingula raising the possibility of MAI infection.  I personally visualized these images and confirm this.  In  addition I feel the 2017 CT chest also shows some scattered groundglass opacities although it was a contrasted CT. therefore she has been referred to pulmonary.  She has been off all rheumatoid arthritis treatment for 9 months.  She has upcoming visit with Dr. Val Eagle in [place] with [facility].  She is worried about the infiltrates.  Her dad had both rheumatoid arthritis and lung cancer.  And he died from lung cancer stage IV.    Lab work was in the [facility] system February 2020 shows hemoglobin 11.8 g% which is slightly below her baseline in 2019.  She had normal liver function test in 2020  Review of the records from outside do not indicate evidence of any CT chest elsewhere in the past   Phone message July/aug 2021  Bronch BAL -  Results for Linda Calderon, Linda Calderon (MRN [id number]) as of 09/18/2019 09:07  Ref. Range 08/18/2019 07:46  Monocyte-Macrophage-Serous Fluid Latest Ref Range: 50 - 90 % 85  Other Cells, Fluid Latest Units: % CORRELATE WITH CYTOLOGY.  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 , MRN: [id number] , ADDRESS: [address] [place] [address]   Type of visit: Telephone/Video Circumstance: COVID-19 national emergency Identification of patient Linda Calderon with [birth year] and MRN [id number] - 2 person identifier Risks: Risks, benefits, limitations of telephone visit explained. Patient understood and verbalized  agreement to proceed Anyone else on call:  -- mom answered Patient location: not known This provider location: [place], [place] office   09/18/2019 -  No chief complaint on file.    HPI Linda Calderon 44 y.o. -for this phone visit the mom answered the phone.  She said that the patient cannot make it because she has an issue with her child and.  I reiterated that this was a telephone visit and not a face-to-face visit.  The mom did not know this information.  The mom subsequently answered the patient cannot make it for the telephone visit and will need to be rescheduled.    ROS - per HPI     has a past medical history of ADD (attention deficit disorder), Chronic neck and back pain, Pneumonia, PTSD (post-traumatic stress disorder), Radiculopathy, and UTI (lower urinary tract infection).   reports that she has been smoking cigarettes. She has a 5.00 pack-year smoking history. She has never used smokeless tobacco.  Past Surgical History:  Procedure Laterality Date  . ABDOMINAL HYSTERECTOMY    . TUBAL LIGATION    . VIDEO BRONCHOSCOPY N/A 08/18/2019   Procedure: FLEXIBLE BRONCHOSCOPY WITH BRONCHOALVEOLAR LAVAGE;  Surgeon: Brand Males, MD;  Location: [facility];  Service: Cardiopulmonary;  Laterality: N/A;    Allergies  Allergen Reactions  . Morphine And Related     Dropped blood pressure, had to lay patient flat and administer IV    Immunization History  Administered Date(s) Administered  . Influenza,inj,Quad PF,6-35 Mos 06/23/2018  . Moderna SARS-COVID-2 Vaccination 04/19/2019, 05/17/2019    Family History  Problem Relation Age of Onset  . Hypertension Mother   . Hypertension Father   . Cancer Father      Current Outpatient Medications:  .  Calcium Carb-Cholecalciferol (CALCIUM-VITAMIN D) 600-400 MG-UNIT TABS, Take 1 tablet by mouth daily. , Disp: , Rfl:  .  doxycycline (VIBRA-TABS) 100 MG tablet, Take 1 tablet (100 mg total) by mouth 2 (two) times  daily., Disp: 10 tablet, Rfl: 0 .  fluconazole (DIFLUCAN) 100 MG tablet, 2 tablets first day followed by one tablet daily for 13d, Disp: 15 tablet, Rfl: 0 .  fluticasone furoate-vilanterol (BREO ELLIPTA) 100-25 MCG/INH AEPB, Inhale 1 puff into the lungs daily., Disp: , Rfl:  .  Multiple Vitamins-Minerals (MULTIVITAMIN WITH MINERALS) tablet, Take 1 tablet by mouth daily., Disp: , Rfl:  .  naproxen (NAPROSYN) 500 MG tablet, Take 1 tablet (500 mg total) by mouth 2 (two) times daily with a meal., Disp: 60 tablet, Rfl: 0 .  Nicotine 21-14-7 MG/24HR KIT, Use as directed., Disp: 1 kit, Rfl: 0      Objective:   There were no vitals filed for this visit.  Estimated body mass index is 20.01 kg/m as calculated from the following:   Height as of 08/24/19: _0  (1.626 m).   Weight as of 08/24/19: 52.9  kg.  _0 @  There were no vitals filed for this visit.   Physical Exam      Assessment:       ICD-10-CM   1. Rheumatoid factor positive  R76.8   2. Pulmonary infiltrates  R91.8   3. Smoking  F17.200        Plan:     Patient Instructions     ICD-10-CM   1. Rheumatoid factor positive  R76.8   2. Pulmonary infiltrates  R91.8   3. Smoking  F17.200     Reschedule visit in BRL inext 1-2 months     SIGNATURE    Dr. Brand Males, M.D., F.C.C.P,  Pulmonary and Critical Care Medicine Staff Physician, [place] Director - Interstitial Lung Disease  Program  Pulmonary [place] at [place], [place], [address]  Pager: [phone], If no answer or between  15:00h - 7:00h: call [phone] Telephone: [phone]  9:12 AM 09/18/2019

## 2019-09-20 LAB — FUNGUS CULTURE WITH STAIN

## 2019-09-20 LAB — FUNGAL ORGANISM REFLEX

## 2019-09-20 LAB — FUNGUS CULTURE RESULT

## 2019-09-26 NOTE — Progress Notes (Deleted)
Office Visit Note  Patient: Linda Calderon             Date of Birth: [birth year]           MRN: [id number]             PCP: [facility] Referring: [facility] Visit Date: 10/10/2019 Occupation: _0 @  Subjective:  No chief complaint on file.   History of Present Illness: HONOR FRISON is a 44 y.o. female ***   Activities of Daily Living:  Patient reports morning stiffness for *** {minute/hour:19697}.   Patient {ACTIONS;DENIES/REPORTS:21021675::"Denies"} nocturnal pain.  Difficulty dressing/grooming: {ACTIONS;DENIES/REPORTS:21021675::"Denies"} Difficulty climbing stairs: {ACTIONS;DENIES/REPORTS:21021675::"Denies"} Difficulty getting out of chair: {ACTIONS;DENIES/REPORTS:21021675::"Denies"} Difficulty using hands for taps, buttons, cutlery, and/or writing: {ACTIONS;DENIES/REPORTS:21021675::"Denies"}  No Rheumatology ROS completed.   PMFS History:  Patient Active Problem List   Diagnosis Date Noted  . Rheumatoid arthritis involving shoulder with positive rheumatoid factor ([place])   . Pulmonary infiltrates 07/31/2019  . Bronchiolitis 07/31/2019  . DDD (degenerative disc disease), lumbar 07/31/2019  . PTSD (post-traumatic stress disorder) 07/31/2019  . 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 Smoker 07/31/2019    Past Medical History:  Diagnosis Date  . ADD (attention deficit disorder)   . Chronic neck and back pain   . Pneumonia   . PTSD (post-traumatic stress disorder)   . Radiculopathy   . UTI (lower urinary tract infection)     Family History  Problem Relation Age of Onset  . Hypertension Mother   . Hypertension Father   . Cancer Father    Past Surgical History:  Procedure Laterality Date  . ABDOMINAL HYSTERECTOMY    . TUBAL LIGATION    . VIDEO BRONCHOSCOPY N/A 08/18/2019   Procedure: FLEXIBLE BRONCHOSCOPY WITH BRONCHOALVEOLAR LAVAGE;  Surgeon: Brand Males, MD;  Location[facility];  Service: Cardiopulmonary;  Laterality: N/A;   Social History   Social History Narrative  . Not on file   Immunization History  Administered Date(s) Administered  . Influenza,inj,Quad PF,6-35 Mos 06/23/2018  . Moderna SARS-COVID-2 Vaccination 04/19/2019, 05/17/2019     Objective: Vital Signs: LMP 03/09/2011    Physical Exam   Musculoskeletal Exam: ***  CDAI Exam: CDAI Score: -- Patient Global: --; Provider Global: -- Swollen: --; Tender: -- Joint Exam 10/10/2019   No joint exam has been documented for this visit   There is currently no information documented on the homunculus. Go to the Rheumatology activity and complete the homunculus joint exam.  Investigation: No additional findings.  Imaging: No results found.  Recent Labs: Lab Results  Component Value Date   WBC 10.5 06/23/2019   HGB 13.1 06/23/2019   PLT 359 06/23/2019   NA 136 06/11/2019   K 3.8 06/11/2019   CL 106 06/11/2019   CO2 19 (L) 06/11/2019   GLUCOSE 100 (H) 06/11/2019   BUN 5 (L) 06/11/2019   CREATININE 0.80 06/11/2019   BILITOT 0.7 06/11/2019   ALKPHOS 95 06/11/2019   AST 18 06/11/2019   ALT 12 06/11/2019   PROT 7.2 07/31/2019   ALBUMIN 3.7 06/11/2019   CALCIUM 9.2 06/11/2019   GFRAA >60 06/11/2019   QFTBGOLDPLUS NEGATIVE 07/31/2019    Speciality Comments: No specialty comments available.  Procedures:  No procedures performed Allergies: Morphine and related   Assessment / Plan:     Visit Diagnoses: Seropositive rheumatoid arthritis ([place]) - Positive RF, positive anti-CCP, +_1 eta, erosive disease with synovitis involving multiple join.  High  risk medication use - MTX-inadequate response, Humirax2 mths.-Inadequate response,RemicadeX1 infusion.  No treatment since 2019.  Needs clearance-Dr. Red Christians & Dr. Chase Caller.   DDD (degenerative disc disease), lumbar  Bronchiolitis  Pulmonary nodules  Smoker  PTSD (post-traumatic stress disorder)  ADD (attention  deficit disorder) without hyperactivity  Orders: No orders of the defined types were placed in this encounter.  No orders of the defined types were placed in this encounter.   Face-to-face time spent with patient was *** minutes. Greater than 50% of time was spent in counseling and coordination of care.  Follow-Up Instructions: No follow-ups on file.   Ofilia Neas, PA-C  Note - This record has been created using Dragon software.  Chart creation errors have been sought, but may not always  have been located. Such creation errors do not reflect on  the standard of medical care.

## 2019-09-30 ENCOUNTER — Ambulatory Visit: Payer: Self-pay

## 2019-09-30 ENCOUNTER — Encounter: Payer: Self-pay | Admitting: Emergency Medicine

## 2019-09-30 ENCOUNTER — Other Ambulatory Visit: Payer: Self-pay

## 2019-09-30 ENCOUNTER — Ambulatory Visit
Admission: EM | Admit: 2019-09-30 | Discharge: 2019-09-30 | Disposition: A | Discharge status: Home / Self Care | Payer: Medicaid Other | Attending: Emergency Medicine | Admitting: Emergency Medicine

## 2019-09-30 DIAGNOSIS — J069 Acute upper respiratory infection, unspecified: Secondary | ICD-10-CM | POA: Insufficient documentation

## 2019-09-30 DIAGNOSIS — J029 Acute pharyngitis, unspecified: Secondary | ICD-10-CM | POA: Diagnosis not present

## 2019-09-30 LAB — POCT RAPID STREP A (OFFICE): Rapid Strep A Screen: NEGATIVE

## 2019-09-30 MED ORDER — CEPACOL REGULAR STRENGTH 3 MG MT LOZG: 1.0000 | LOZENGE | OROMUCOSAL | 0 refills | Status: DC | PRN

## 2019-09-30 MED ORDER — FLUTICASONE PROPIONATE 50 MCG/ACT NA SUSP
1.0000 | Freq: Every day | NASAL | 0 refills | Status: DC
Start: 2019-09-30 — End: 2021-02-14

## 2019-09-30 MED ORDER — BENZONATATE 100 MG PO CAPS
100.0000 mg | ORAL_CAPSULE | Freq: Three times a day (TID) | ORAL | 0 refills | Status: DC
Start: 2019-09-30 — End: 2020-05-24

## 2019-09-30 MED ORDER — PREDNISONE 10 MG PO TABS
20.0000 mg | ORAL_TABLET | Freq: Every day | ORAL | 0 refills | Status: DC
Start: 2019-09-30 — End: 2019-12-31

## 2019-09-30 MED ORDER — CETIRIZINE HCL 10 MG PO TABS: 10.0000 mg | ORAL_TABLET | Freq: Every day | ORAL | 0 refills | Status: DC

## 2019-09-30 NOTE — ED Triage Notes (Addendum)
Sore throat, headache and body aches since Sunday. Had send out covid test on Tuesday that was neg. Pt states she think it is strep throat.

## 2019-09-30 NOTE — ED Provider Notes (Signed)
[facility]   [id number] 09/30/19 Arrival Time: 4431  VQ:MGQQ THROAT  SUBJECTIVE: History from: patient.  Linda Calderon is a 44 y.o. female who presents to the urgent care for complaint of sore throat, headache, cough and body aches for the past 1 week.  Denies sick exposure to COVID, strep, flu or mono, or precipitating event.  Has tested negative for Covid PCR few days ago.  Has tried OTC medication without relief.  Symptoms are made worse with swallowing, but tolerating liquids and own secretions without difficulty.  Denies previous symptoms in the past.  Denies chills, fever, nausea, vomiting, diarrhea   ROS: As per HPI.  All other pertinent ROS negative.     Past Medical History:  Diagnosis Date  . ADD (attention deficit disorder)   . Chronic neck and back pain   . Pneumonia   . PTSD (post-traumatic stress disorder)   . Radiculopathy   . UTI (lower urinary tract infection)    Past Surgical History:  Procedure Laterality Date  . ABDOMINAL HYSTERECTOMY    . TUBAL LIGATION    . VIDEO BRONCHOSCOPY N/A 08/18/2019   Procedure: FLEXIBLE BRONCHOSCOPY WITH BRONCHOALVEOLAR LAVAGE;  Surgeon: Brand Males, MD;  Location[facility];  Service: Cardiopulmonary;  Laterality: N/A;   Allergies  Allergen Reactions  . Morphine And Related     Dropped blood pressure, had to lay patient flat and administer IV   No current facility-administered medications on file prior to encounter.   Current Outpatient Medications on File Prior to Encounter  Medication Sig Dispense Refill  . Calcium Carb-Cholecalciferol (CALCIUM-VITAMIN D) 600-400 MG-UNIT TABS Take 1 tablet by mouth daily.     Marland Kitchen doxycycline (VIBRA-TABS) 100 MG tablet Take 1 tablet (100 mg total) by mouth 2 (two) times daily. 10 tablet 0  . fluconazole (DIFLUCAN) 100 MG tablet 2 tablets first day followed by one tablet daily for 13d 15 tablet 0  . fluticasone furoate-vilanterol (BREO ELLIPTA) 100-25 MCG/INH AEPB Inhale 1  puff into the lungs daily.    . Multiple Vitamins-Minerals (MULTIVITAMIN WITH MINERALS) tablet Take 1 tablet by mouth daily.    . naproxen (NAPROSYN) 500 MG tablet Take 1 tablet (500 mg total) by mouth 2 (two) times daily with a meal. 60 tablet 0  . Nicotine 21-14-7 MG/24HR KIT Use as directed. 1 kit 0   Social History   Socioeconomic History  . Marital status: Married    Spouse name: Not on file  . Number of children: Not on file  . Years of education: Not on file  . Highest education level: Not on file  Occupational History  . Not on file  Tobacco Use  . Smoking status: Current Every Day Smoker    Packs/day: 0.50    Years: 10.00    Pack years: 5.00    Types: Cigarettes  . Smokeless tobacco: Never Used  Vaping Use  . Vaping Use: Never used  Substance and Sexual Activity  . Alcohol use: No  . Drug use: Yes    Types: Marijuana  . Sexual activity: Yes    Birth control/protection: Surgical  Other Topics Concern  . Not on file  Social History Narrative  . Not on file   Social Determinants of Health   Financial Resource Strain:   . Difficulty of Paying Living Expenses: Not on file  Food Insecurity:   . Worried About Charity fundraiser in the Last Year: Not on file  . Ran Out of Food in the  Last Year: Not on file  Transportation Needs:   . Lack of Transportation (Medical): Not on file  . Lack of Transportation (Non-Medical): Not on file  Physical Activity:   . Days of Exercise per Week: Not on file  . Minutes of Exercise per Session: Not on file  Stress:   . Feeling of Stress : Not on file  Social Connections:   . Frequency of Communication with Friends and Family: Not on file  . Frequency of Social Gatherings with Friends and Family: Not on file  . Attends Religious Services: Not on file  . Active Member of Clubs or Organizations: Not on file  . Attends Archivist Meetings: Not on file  . Marital Status: Not on file  Intimate Partner Violence:   . Fear  of Current or Ex-Partner: Not on file  . Emotionally Abused: Not on file  . Physically Abused: Not on file  . Sexually Abused: Not on file   Family History  Problem Relation Age of Onset  . Hypertension Mother   . Hypertension Father   . Cancer Father     OBJECTIVE:  Vitals:   09/30/19 1241 09/30/19 1243  BP:  116/85  Pulse:  80  Resp:  17  Temp:  97.9 F (36.6 C)  TempSrc:  Oral  SpO2:  95%  Weight: 114 lb 10.2 oz (52 kg)   Height: _0  (1.626 m)      General appearance: alert; appears fatigued, but nontoxic, speaking in full sentences and managing own secretions HEENT: NCAT; Ears: EACs clear, TMs pearly gray with visible cone of light, without erythema; Eyes: PERRL, EOMI grossly; Nose: no obvious rhinorrhea; Throat: oropharynx clear, tonsils 1+ and mildly erythematous without white tonsillar exudates, uvula midline Neck: supple without LAD Lungs: CTA bilaterally without adventitious breath sounds; cough present Heart: regular rate and rhythm.  Radial pulses 2+ symmetrical bilaterally Skin: warm and dry Psychological: alert and cooperative; normal mood and affect  LABS: Results for orders placed or performed during the hospital encounter of 09/30/19 (from the past 24 hour(s))  POCT rapid strep A     Status: None   Collection Time: 09/30/19 12:48 PM  Result Value Ref Range   Rapid Strep A Screen Negative Negative     ASSESSMENT & PLAN:  1. Viral URI with cough   2. Sore throat     Meds ordered this encounter  Medications  . menthol-cetylpyridinium (CEPACOL REGULAR STRENGTH) 3 MG lozenge    Sig: Take 1 lozenge (3 mg total) by mouth as needed for sore throat.    Dispense:  100 tablet    Refill:  0  . cetirizine (ZYRTEC ALLERGY) 10 MG tablet    Sig: Take 1 tablet (10 mg total) by mouth daily.    Dispense:  30 tablet    Refill:  0  . benzonatate (TESSALON) 100 MG capsule    Sig: Take 1 capsule (100 mg total) by mouth every 8 (eight) hours.    Dispense:  30  capsule    Refill:  0  . fluticasone (FLONASE) 50 MCG/ACT nasal spray    Sig: Place 1 spray into both nostrils daily for 14 days.    Dispense:  16 g    Refill:  0  . predniSONE (DELTASONE) 10 MG tablet    Sig: Take 2 tablets (20 mg total) by mouth daily.    Dispense:  15 tablet    Refill:  0   Discharge instructions  Strep test  was negative/culture was sent, we will call you only with abnormal result Get plenty of rest and push fluids Zyrtec D prescribed. Use daily for symptomatic relief Flonase was prescribed Tessalon Perles prescribed for cough Prednisone was prescribed Cepacol was not prescribed for sore throat Drink warm or cool liquids, use throat lozenges, or popsicles to help alleviate symptoms Take OTC ibuprofen or tylenol as needed for pain Follow up with PCP if symptoms persists Return or go to ER if patient has any new or worsening symptoms such as fever, chills, nausea, vomiting, worsening sore throat, cough, abdominal pain, chest pain, changes in bowel or bladder habits, etc...  Reviewed expectations re: course of current medical issues. Questions answered. Outlined signs and symptoms indicating need for more acute intervention. Patient verbalized understanding. After Visit Summary given.      Note: This document was prepared using Dragon voice recognition software and may include unintentional dictation errors.    Emerson Monte, [place] 09/30/19 1317

## 2019-09-30 NOTE — Discharge Instructions (Signed)
Strep test was negative/culture was sent, we will call you only with abnormal result Get plenty of rest and push fluids Zyrtec D prescribed. Use daily for symptomatic relief Flonase was prescribed Tessalon Perles prescribed for cough Prednisone was prescribed Cepacol was not prescribed for sore throat Drink warm or cool liquids, use throat lozenges, or popsicles to help alleviate symptoms Take OTC ibuprofen or tylenol as needed for pain Follow up with PCP if symptoms persists Return or go to ER if patient has any new or worsening symptoms such as fever, chills, nausea, vomiting, worsening sore throat, cough, abdominal pain, chest pain, changes in bowel or bladder habits, etc..Marland Kitchen

## 2019-10-02 LAB — CULTURE, GROUP A STREP (THRC)

## 2019-10-02 LAB — ACID FAST CULTURE WITH REFLEXED SENSITIVITIES (MYCOBACTERIA): Acid Fast Culture: NEGATIVE

## 2019-10-06 ENCOUNTER — Ambulatory Visit: Payer: Medicaid Other | Admitting: Internal Medicine

## 2019-10-10 ENCOUNTER — Ambulatory Visit: Payer: Medicaid Other | Admitting: Rheumatology

## 2019-11-09 ENCOUNTER — Ambulatory Visit: Payer: Medicaid Other | Admitting: Adult Health

## 2019-12-31 ENCOUNTER — Other Ambulatory Visit: Payer: Self-pay

## 2019-12-31 ENCOUNTER — Ambulatory Visit
Admission: RE | Admit: 2019-12-31 | Discharge: 2019-12-31 | Disposition: A | Discharge status: Home / Self Care | Payer: Medicaid Other | Source: Ambulatory Visit | Attending: Family Medicine | Admitting: Family Medicine

## 2019-12-31 VITALS — BP 107/75 | HR 79 | Temp 98.0°F | Resp 20

## 2019-12-31 DIAGNOSIS — B349 Viral infection, unspecified: Secondary | ICD-10-CM

## 2019-12-31 DIAGNOSIS — J209 Acute bronchitis, unspecified: Secondary | ICD-10-CM

## 2019-12-31 MED ORDER — PROMETHAZINE-DM 6.25-15 MG/5ML PO SYRP
5.0000 mL | ORAL_SOLUTION | Freq: Four times a day (QID) | ORAL | 0 refills | Status: DC | PRN
Start: 2019-12-31 — End: 2020-05-24

## 2019-12-31 MED ORDER — PREDNISONE 20 MG PO TABS
20.0000 mg | ORAL_TABLET | Freq: Every day | ORAL | 0 refills | Status: AC
Start: 2019-12-31 — End: 2020-01-05

## 2019-12-31 NOTE — ED Triage Notes (Signed)
Pt presents with c/o cough, fever and body aches since wenesday , negative covid

## 2019-12-31 NOTE — ED Provider Notes (Signed)
[facility]    CSN: [id number] Arrival date & time: 12/31/19  1306      History   Chief Complaint Chief Complaint  Patient presents with  . Cough  . Fever    HPI Linda Calderon is a 44 y.o. female.   HPI  Patient presents today with flulike symptoms of body aches, fatigue, cough, congestion x5 days.  Patient denies known sick contacts.  Patient had a negative COVID-19 test 5 days ago.  Symptoms have persisted in spite of OTC medication.  She reports last fever today was 99.8.  She is afebrile at present.  Been taking Tylenol Cold and cough for management of symptoms.    Past Medical History:  Diagnosis Date  . ADD (attention deficit disorder)   . Chronic neck and back pain   . Pneumonia   . PTSD (post-traumatic stress disorder)   . Radiculopathy   . UTI (lower urinary tract infection)     Patient Active Problem List   Diagnosis Date Noted  . 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 Pulmonary infiltrates 07/31/2019  . Bronchiolitis 07/31/2019  . DDD (degenerative disc disease), lumbar 07/31/2019  . PTSD (post-traumatic stress disorder) 07/31/2019  . 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 Smoker 07/31/2019    Past Surgical History:  Procedure Laterality Date  . ABDOMINAL HYSTERECTOMY    . TUBAL LIGATION    . VIDEO BRONCHOSCOPY N/A 08/18/2019   Procedure: FLEXIBLE BRONCHOSCOPY WITH BRONCHOALVEOLAR LAVAGE;  Surgeon: Brand Males, MD;  Location: [facility];  Service: Cardiopulmonary;  Laterality: N/A;    OB History    Gravida  8   Para  1   Term  1   Preterm      AB  7   Living  1     SAB  7   IAB      Ectopic      Multiple      Live Births               Home Medications    Prior to Admission medications   Medication Sig Start Date End Date Taking? Authorizing Provider  benzonatate (TESSALON) 100 MG capsule Take 1 capsule (100 mg total) by mouth  every 8 (eight) hours. 09/30/19   Avegno, Darrelyn Hillock, FNP  Calcium Carb-Cholecalciferol (CALCIUM-VITAMIN D) 600-400 MG-UNIT TABS Take 1 tablet by mouth daily.     [provider]  cetirizine (ZYRTEC ALLERGY) 10 MG tablet Take 1 tablet (10 mg total) by mouth daily. 09/30/19   Avegno, Darrelyn Hillock, FNP  doxycycline (VIBRA-TABS) 100 MG tablet Take 1 tablet (100 mg total) by mouth 2 (two) times daily. 09/05/19   Brand Males, MD  fluconazole (DIFLUCAN) 100 MG tablet 2 tablets first day followed by one tablet daily for 13d 08/30/19   Brand Males, MD  fluticasone (FLONASE) 50 MCG/ACT nasal spray Place 1 spray into both nostrils daily for 14 days. 09/30/19 10/14/19  Avegno, Darrelyn Hillock, FNP  fluticasone furoate-vilanterol (BREO ELLIPTA) 100-25 MCG/INH AEPB Inhale 1 puff into the lungs daily.    [provider]  menthol-cetylpyridinium (CEPACOL REGULAR STRENGTH) 3 MG lozenge Take 1 lozenge (3 mg total) by mouth as needed for sore throat. 09/30/19   Avegno, Darrelyn Hillock, FNP  Multiple Vitamins-Minerals (MULTIVITAMIN WITH MINERALS) tablet Take 1 tablet by mouth daily.    [provider]  naproxen (NAPROSYN) 500 MG tablet Take 1 tablet (500 mg total) by mouth 2 (  two) times daily with a meal. 08/24/19   Bo Merino, MD  Nicotine 21-14-7 MG/24HR KIT Use as directed. 08/18/19   Brand Males, MD  predniSONE (DELTASONE) 10 MG tablet Take 2 tablets (20 mg total) by mouth daily. 09/30/19   Avegno, Darrelyn Hillock, FNP    Family History Family History  Problem Relation Age of Onset  . Hypertension Mother   . Hypertension Father   . Cancer Father     Social History Social History   Tobacco Use  . Smoking status: Current Every Day Smoker    Packs/day: 0.50    Years: 10.00    Pack years: 5.00    Types: Cigarettes  . Smokeless tobacco: Never Used  Vaping Use  . Vaping Use: Never used  Substance Use Topics  . Alcohol use: No  . Drug use: Yes    Types: Marijuana      Allergies   Morphine and related   Review of Systems Review of Systems Pertinent negatives listed in HPI   Physical Exam Triage Vital Signs ED Triage Vitals  Enc Vitals Group     BP 12/31/19 1329 107/75     Pulse Rate 12/31/19 1329 79     Resp 12/31/19 1329 20     Temp 12/31/19 1329 98 F (36.7 C)     Temp src --      SpO2 12/31/19 1329 95 %     Weight --      Height --      Head Circumference --      Peak Flow --      Pain Score 12/31/19 1328 5     Pain Loc --      Pain Edu? --      Excl. in [place]? --    No data found.  Updated Vital Signs BP 107/75   Pulse 79   Temp 98 F (36.7 C)   Resp 20   LMP 03/09/2011   SpO2 95%   Visual Acuity Right Eye Distance:   Left Eye Distance:   Bilateral Distance:    Right Eye Near:   Left Eye Near:    Bilateral Near:     Physical Exam General appearance: alert, Ill-appearing, no distress Head: Normocephalic, without obvious abnormality, atraumatic ENT: mucosal edema, congestion, oropharynx w/o exudate Respiratory: Respirations even , unlabored, coarse lung sound, no crackles/wheeze  Heart: rate and rhythm normal. No gallop or murmurs noted on exam  Abdomen: BS +, no distention, no rebound tenderness, or no mass Extremities: No gross deformities Skin: Skin color, texture, turgor normal. No rashes seen  Psych: Appropriate mood and affect. Neurologic: Mental status: Alert, oriented to person, place, and time, thought content appropriate.   UC Treatments / Results  Labs (all labs ordered are listed, but only abnormal results are displayed) Labs Reviewed  COVID-19, FLU A+B NAA    EKG   Radiology No results found.  Procedures Procedures (including critical care time)  Medications Ordered in UC Medications - No data to display  Initial Impression / Assessment and Plan / UC Course  I have reviewed the triage vital signs and the nursing notes.  Pertinent labs & imaging results that were available during  my care of the patient were reviewed by me and considered in my medical decision making (see chart for details).    Symptoms appear to be viral. Given history of asthma and current smoking status associated with persistent coughing concern for possible bronchitis.  Will cover with a short  course of prednisone.  Promethazine DM for cough.  Patient advised to follow-up if symptoms worsen with primary care provider.  COVID-19/flu test pending Final Clinical Impressions(s) / UC Diagnoses   Final diagnoses:  Viral illness  Acute bronchitis, unspecified organism   Discharge Instructions   None    ED Prescriptions    Medication Sig Dispense Auth. Provider   promethazine-dextromethorphan (PROMETHAZINE-DM) 6.25-15 MG/5ML syrup Take 5 mLs by mouth 4 (four) times daily as needed for cough. 140 mL Scot Jun, FNP   predniSONE (DELTASONE) 20 MG tablet Take 1 tablet (20 mg total) by mouth daily with breakfast for 5 days. 5 tablet Scot Jun, FNP     PDMP not reviewed this encounter.   Scot Jun, FNP 12/31/19 1428

## 2020-01-03 LAB — COVID-19, FLU A+B NAA
Influenza A, NAA: NOT DETECTED
Influenza B, NAA: NOT DETECTED
SARS-CoV-2, NAA: NOT DETECTED

## 2020-02-29 ENCOUNTER — Other Ambulatory Visit: Payer: Self-pay

## 2020-02-29 ENCOUNTER — Ambulatory Visit
Admission: RE | Admit: 2020-02-29 | Discharge: 2020-02-29 | Disposition: A | Discharge status: Home / Self Care | Payer: Medicaid Other | Source: Ambulatory Visit | Attending: Family Medicine | Admitting: Family Medicine

## 2020-02-29 VITALS — BP 118/80 | HR 114 | Temp 97.8°F | Resp 18

## 2020-02-29 DIAGNOSIS — J069 Acute upper respiratory infection, unspecified: Secondary | ICD-10-CM | POA: Diagnosis not present

## 2020-02-29 DIAGNOSIS — R000 Tachycardia, unspecified: Secondary | ICD-10-CM | POA: Diagnosis not present

## 2020-02-29 NOTE — ED Provider Notes (Signed)
[place]   [id number] 02/29/20 Arrival Time: 6568   CC: COVID symptoms  SUBJECTIVE: History from: patient.  Linda Calderon is a 45 y.o. female who presents with fatigue, cough and fever over the last 2-3 days. Denies sick exposure to COVID, flu or strep. Denies recent travel. Has negative history of Covid. Has completed Covid vaccines. Has not taken OTC medications for this. There are no aggravating or alleviating factors. Denies previous symptoms in the past. Denies sinus pain, rhinorrhea, SOB, wheezing, chest pain, nausea, changes in bowel or bladder habits.    ROS: As per HPI.  All other pertinent ROS negative.     Past Medical History:  Diagnosis Date  . ADD (attention deficit disorder)   . Chronic neck and back pain   . Pneumonia   . PTSD (post-traumatic stress disorder)   . Radiculopathy   . UTI (lower urinary tract infection)    Past Surgical History:  Procedure Laterality Date  . ABDOMINAL HYSTERECTOMY    . TUBAL LIGATION    . VIDEO BRONCHOSCOPY N/A 08/18/2019   Procedure: FLEXIBLE BRONCHOSCOPY WITH BRONCHOALVEOLAR LAVAGE;  Surgeon: Brand Males, MD;  Location: [facility];  Service: Cardiopulmonary;  Laterality: N/A;   Allergies  Allergen Reactions  . Morphine And Related     Dropped blood pressure, had to lay patient flat and administer IV   No current facility-administered medications on file prior to encounter.   Current Outpatient Medications on File Prior to Encounter  Medication Sig Dispense Refill  . benzonatate (TESSALON) 100 MG capsule Take 1 capsule (100 mg total) by mouth every 8 (eight) hours. 30 capsule 0  . Calcium Carb-Cholecalciferol (CALCIUM-VITAMIN D) 600-400 MG-UNIT TABS Take 1 tablet by mouth daily.     . cetirizine (ZYRTEC ALLERGY) 10 MG tablet Take 1 tablet (10 mg total) by mouth daily. 30 tablet 0  . doxycycline (VIBRA-TABS) 100 MG tablet Take 1 tablet (100 mg total) by mouth 2 (two) times daily. 10 tablet 0  . fluconazole  (DIFLUCAN) 100 MG tablet 2 tablets first day followed by one tablet daily for 13d 15 tablet 0  . fluticasone (FLONASE) 50 MCG/ACT nasal spray Place 1 spray into both nostrils daily for 14 days. 16 g 0  . fluticasone furoate-vilanterol (BREO ELLIPTA) 100-25 MCG/INH AEPB Inhale 1 puff into the lungs daily.    Marland Kitchen menthol-cetylpyridinium (CEPACOL REGULAR STRENGTH) 3 MG lozenge Take 1 lozenge (3 mg total) by mouth as needed for sore throat. 100 tablet 0  . Multiple Vitamins-Minerals (MULTIVITAMIN WITH MINERALS) tablet Take 1 tablet by mouth daily.    . naproxen (NAPROSYN) 500 MG tablet Take 1 tablet (500 mg total) by mouth 2 (two) times daily with a meal. 60 tablet 0  . Nicotine 21-14-7 MG/24HR KIT Use as directed. 1 kit 0  . promethazine-dextromethorphan (PROMETHAZINE-DM) 6.25-15 MG/5ML syrup Take 5 mLs by mouth 4 (four) times daily as needed for cough. 140 mL 0   Social History   Socioeconomic History  . Marital status: Married    Spouse name: Not on file  . Number of children: Not on file  . Years of education: Not on file  . Highest education level: Not on file  Occupational History  . Not on file  Tobacco Use  . Smoking status: Current Every Day Smoker    Packs/day: 0.50    Years: 10.00    Pack years: 5.00    Types: Cigarettes  . Smokeless tobacco: Never Used  Vaping Use  . Vaping  Use: Never used  Substance and Sexual Activity  . Alcohol use: No  . Drug use: Yes    Types: Marijuana  . Sexual activity: Yes    Birth control/protection: Surgical  Other Topics Concern  . Not on file  Social History Narrative  . Not on file   Social Determinants of Health   Financial Resource Strain: Not on file  Food Insecurity: Not on file  Transportation Needs: Not on file  Physical Activity: Not on file  Stress: Not on file  Social Connections: Not on file  Intimate Partner Violence: Not on file   Family History  Problem Relation Age of Onset  . Hypertension Mother   . Hypertension  Father   . Cancer Father     OBJECTIVE:  Vitals:   02/29/20 1337  BP: 118/80  Pulse: (!) 114  Resp: 18  Temp: 97.8 F (36.6 C)  SpO2: 95%     General appearance: alert; appears fatigued, but nontoxic; speaking in full sentences and tolerating own secretions HEENT: NCAT; Ears: EACs clear, TMs pearly gray; Eyes: PERRL.  EOM grossly intact. Sinuses: nontender; Nose: nares patent with clear rhinorrhea, Throat: oropharynx erythematous, cobblestoning present, tonsils non erythematous or enlarged, uvula midline  Neck: supple without LAD Lungs: unlabored respirations, symmetrical air entry; cough: absent; no respiratory distress; CTAB Heart: regular rate and rhythm.  Radial pulses 2+ symmetrical bilaterally Skin: warm and dry Psychological: alert and cooperative; normal mood and affect  LABS:  No results found for this or any previous visit (from the past 24 hour(s)).   ASSESSMENT & PLAN:  1. Viral URI with cough   2. Tachycardia     Continue supportive care at home COVID and flu testing ordered.  It will take between 2-3 days for test results. Someone will contact you regarding abnormal results.   Work note provided Patient should remain in quarantine until they have received Covid results.  If negative you may resume normal activities (go back to work/school) while practicing hand hygiene, social distance, and mask wearing.  If positive, patient should remain in quarantine for at least 5 days from symptom onset AND greater than 72 hours after symptoms resolution (absence of fever without the use of fever-reducing medication and improvement in respiratory symptoms), whichever is longer Get plenty of rest and push fluids Use OTC zyrtec for nasal congestion, runny nose, and/or sore throat Use OTC flonase for nasal congestion and runny nose Use medications daily for symptom relief Use OTC medications like ibuprofen or tylenol as needed fever or pain Call or go to the ED if you have  any new or worsening symptoms such as fever, worsening cough, shortness of breath, chest tightness, chest pain, turning blue, changes in mental status.  Reviewed expectations re: course of current medical issues. Questions answered. Outlined signs and symptoms indicating need for more acute intervention. Patient verbalized understanding. After Visit Summary given.         Faustino Congress, NP 02/29/20 [phone]

## 2020-02-29 NOTE — ED Triage Notes (Signed)
Pt presents with c/o fatigue, cough and fever for past few days

## 2020-02-29 NOTE — Discharge Instructions (Addendum)
Your COVID test is pending.  You should self quarantine until the test result is back.    Take Tylenol or ibuprofen as needed for fever or discomfort.  Rest and keep yourself hydrated.    Follow-up with your primary care provider if your symptoms are not improving.     

## 2020-03-01 LAB — COVID-19, FLU A+B NAA
Influenza A, NAA: NOT DETECTED
Influenza B, NAA: NOT DETECTED
SARS-CoV-2, NAA: NOT DETECTED

## 2020-03-16 ENCOUNTER — Telehealth: Payer: Medicaid Other | Admitting: Nurse Practitioner

## 2020-03-16 DIAGNOSIS — K089 Disorder of teeth and supporting structures, unspecified: Secondary | ICD-10-CM | POA: Diagnosis not present

## 2020-03-16 DIAGNOSIS — G8929 Other chronic pain: Secondary | ICD-10-CM | POA: Diagnosis not present

## 2020-03-16 MED ORDER — IBUPROFEN 600 MG PO TABS
600.0000 mg | ORAL_TABLET | Freq: Three times a day (TID) | ORAL | 0 refills | Status: DC | PRN
Start: 2020-03-16 — End: 2020-05-24

## 2020-03-16 MED ORDER — AMOXICILLIN 500 MG PO CAPS
500.0000 mg | ORAL_CAPSULE | Freq: Three times a day (TID) | ORAL | 0 refills | Status: DC
Start: 2020-03-16 — End: 2020-05-24

## 2020-03-16 NOTE — Progress Notes (Signed)
E-Visit for Dental Pain  We are sorry that you are not feeling well.  Here is how we plan to help!  Based on what you have shared with me in the questionnaire, it sounds like you have dental abscess  Ibuprofen 600mg  3 times a day for 7 days for discomfort and Amoxicillin 500mg  3 times per day for 10 daysib  It is imperative that you see a dentist within 10 days of this eVisit to determine the cause of the dental pain and be sure it is adequately treated  A toothache or tooth pain is caused when the nerve in the root of a tooth or surrounding a tooth is irritated. Dental (tooth) infection, decay, injury, or loss of a tooth are the most common causes of dental pain. Pain may also occur after an extraction (tooth is pulled out). Pain sometimes originates from other areas and radiates to the jaw, thus appearing to be tooth pain.Bacteria growing inside your mouth can contribute to gum disease and dental decay, both of which can cause pain. A toothache occurs from inflammation of the central portion of the tooth called pulp. The pulp contains nerve endings that are very sensitive to pain. Inflammation to the pulp or pulpitis may be caused by dental cavities, trauma, and infection.    HOME CARE:   For toothaches: . Over-the-counter pain medications such as acetaminophen or ibuprofen may be used. Take these as directed on the package while you arrange for a dental appointment. . Avoid very cold or hot foods, because they may make the pain worse. . You may get relief from biting on a cotton ball soaked in oil of cloves. You can get oil of cloves at most drug stores.  For jaw pain: .  Aspirin may be helpful for problems in the joint of the jaw in adults. . If pain happens every time you open your mouth widely, the temporomandibular joint (TMJ) may be the source of the pain. Yawning or taking a large bite of food may worsen the pain. An appointment with your doctor or dentist will help you find the  cause.     GET HELP RIGHT AWAY IF:  . You have a high fever or chills . If you have had a recent head or face injury and develop headache, light headedness, nausea, vomiting, or other symptoms that concern you after an injury to your face or mouth, you could have a more serious injury in addition to your dental injury. . A facial rash associated with a toothache: This condition may improve with medication. Contact your doctor for them to decide what is appropriate. . Any jaw pain occurring with chest pain: Although jaw pain is most commonly caused by dental disease, it is sometimes referred pain from other areas. People with heart disease, especially people who have had stents placed, people with diabetes, or those who have had heart surgery may have jaw pain as a symptom of heart attack or angina. If your jaw or tooth pain is associated with lightheadedness, sweating, or shortness of breath, you should see a doctor as soon as possible. . Trouble swallowing or excessive pain or bleeding from gums: If you have a history of a weakened immune system, diabetes, or steroid use, you may be more susceptible to infections. Infections can often be more severe and extensive or caused by unusual organisms. Dental and gum infections in people with these conditions may require more aggressive treatment. An abscess may need draining or IV antibiotics, for  example.  MAKE SURE YOU    Understand these instructions.  Will watch your condition.  Will get help right away if you are not doing well or get worse.  Thank you for choosing an e-visit. Your e-visit answers were reviewed by a board certified advanced clinical practitioner to complete your personal care plan. Depending upon the condition, your plan could have included both over the counter or prescription medications. Please review your pharmacy choice. Make sure the pharmacy is open so you can pick up prescription now. If there is a problem, you may  contact your provider through [employer] and have the prescription routed to another pharmacy. Your safety is important to [place]. If you have drug allergies check your prescription carefully.  For the next 24 hours you can use MyChart to ask questions about today's visit, request a non-urgent call back, or ask for a work or school excuse. You will get an email in the next two days asking about your experience. I hope that your e-visit has been valuable and will speed your recovery.   5-10 minutes spent reviewing and documenting in chart.

## 2020-05-10 ENCOUNTER — Ambulatory Visit: Payer: Self-pay

## 2020-05-18 ENCOUNTER — Other Ambulatory Visit: Payer: Self-pay | Admitting: Nurse Practitioner

## 2020-05-24 ENCOUNTER — Other Ambulatory Visit: Payer: Self-pay

## 2020-05-24 ENCOUNTER — Ambulatory Visit
Admission: RE | Admit: 2020-05-24 | Discharge: 2020-05-24 | Disposition: A | Discharge status: Home / Self Care | Payer: Medicaid Other | Source: Ambulatory Visit | Attending: Emergency Medicine | Admitting: Emergency Medicine

## 2020-05-24 ENCOUNTER — Ambulatory Visit (INDEPENDENT_AMBULATORY_CARE_PROVIDER_SITE_OTHER): Payer: Medicaid Other

## 2020-05-24 VITALS — BP 115/81 | HR 83 | Temp 98.1°F | Resp 18

## 2020-05-24 DIAGNOSIS — M25572 Pain in left ankle and joints of left foot: Secondary | ICD-10-CM

## 2020-05-24 DIAGNOSIS — M79672 Pain in left foot: Secondary | ICD-10-CM

## 2020-05-24 MED ORDER — IBUPROFEN 600 MG PO TABS: 600.0000 mg | ORAL_TABLET | Freq: Four times a day (QID) | ORAL | 0 refills | Status: DC | PRN

## 2020-05-24 NOTE — ED Triage Notes (Signed)
Pt presents with left side foot pain, has knot on top of foot that is swollen and hard, has h/o RA

## 2020-05-24 NOTE — ED Provider Notes (Signed)
HPI  SUBJECTIVE:  Linda Calderon is a 45 y.o. female who presents with 1 month of dorsal left foot pain, swelling.  Describes the pain as sore, lasting hours.  She states the pain starts in her midfoot and goes to her toes, occasionally radiating up into her ankle.  She states that her foot is intermittently cold and tingles.  No fevers, erythema, joint swelling, trauma to the foot, change in physical activity.  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  No color changes.  No antipyretic in the past 6 hours.  She has tried ibuprofen and Goody powders without much improvement in her symptoms.  Symptoms are worse with weightbearing.  She has a past medical history of rheumatoid arthritis.  Denies being on prolonged steroids.  No history of osteoporosis, ganglion cyst, diabetes, gout, history of left foot injury.  LMP: Status post hysterectomy.  PMD: [facility]   Past Medical History:  Diagnosis Date  . ADD (attention deficit disorder)   . Chronic neck and back pain   . Pneumonia   . PTSD (post-traumatic stress disorder)   . Radiculopathy   . UTI (lower urinary tract infection)     Past Surgical History:  Procedure Laterality Date  . ABDOMINAL HYSTERECTOMY    . TUBAL LIGATION    . VIDEO BRONCHOSCOPY N/A 08/18/2019   Procedure: FLEXIBLE BRONCHOSCOPY WITH BRONCHOALVEOLAR LAVAGE;  Surgeon: Brand Males, MD;  Location: [facility];  Service: Cardiopulmonary;  Laterality: N/A;    Family History  Problem Relation Age of Onset  . Hypertension Mother   . Hypertension Father   . Cancer Father     Social History   Tobacco Use  . Smoking status: Current Every Day Smoker    Packs/day: 0.50    Years: 10.00    Pack years: 5.00    Types: Cigarettes  . Smokeless tobacco: Never Used  Vaping Use  . Vaping Use: Never used  Substance Use Topics  . Alcohol use: No  . Drug use: Yes    Types: Marijuana    No current facility-administered medications for this  encounter.  Current Outpatient Medications:  .  ibuprofen (ADVIL) 600 MG tablet, Take 1 tablet (600 mg total) by mouth every 6 (six) hours as needed., Disp: 30 tablet, Rfl: 0 .  Calcium Carb-Cholecalciferol (CALCIUM-VITAMIN D) 600-400 MG-UNIT TABS, Take 1 tablet by mouth daily. , Disp: , Rfl:  .  cetirizine (ZYRTEC ALLERGY) 10 MG tablet, Take 1 tablet (10 mg total) by mouth daily., Disp: 30 tablet, Rfl: 0 .  fluticasone (FLONASE) 50 MCG/ACT nasal spray, Place 1 spray into both nostrils daily for 14 days., Disp: 16 g, Rfl: 0 .  fluticasone furoate-vilanterol (BREO ELLIPTA) 100-25 MCG/INH AEPB, Inhale 1 puff into the lungs daily., Disp: , Rfl:  .  menthol-cetylpyridinium (CEPACOL REGULAR STRENGTH) 3 MG lozenge, Take 1 lozenge (3 mg total) by mouth as needed for sore throat., Disp: 100 tablet, Rfl: 0 .  Multiple Vitamins-Minerals (MULTIVITAMIN WITH MINERALS) tablet, Take 1 tablet by mouth daily., Disp: , Rfl:  .  Nicotine 21-14-7 MG/24HR KIT, Use as directed., Disp: 1 kit, Rfl: 0  Allergies  Allergen Reactions  . Morphine And Related     Dropped blood pressure, had to lay patient flat and administer IV     ROS  As noted in HPI.   Physical Exam  BP 115/81   Pulse 83   Temp 98.1 F (36.7 C) (Oral)   Resp 18  LMP 03/09/2011   SpO2 97%   Constitutional: Well developed, well nourished, no acute distress Eyes:  EOMI, conjunctiva normal bilaterally HENT: Normocephalic, atraumatic,mucus membranes moist Respiratory: Normal inspiratory effort Cardiovascular: Normal rate GI: nondistended skin: No rash, skin intact Musculoskeletal: Left foot: Normal appearance.  No erythema.  No appreciable swelling compared to other foot.  Midfoot tender.  First, second, fifth metatarsal tender. No bruising. Skin intact. DP 2+. Refill less than 2 seconds. Sensation grossly intact. Patient able to move all toes actively.  no pain with inversion / eversion,  dorsiflexion / plantarflexion. No Tenderness along  the plantar fascia.  Calcaneus nontender, distal fibula NT, Medial malleolus NT,  Deltoid ligament NT, Lateral ligaments NT, Achilles NT. Patient able  to bear weight while in department. Neurologic: Alert & oriented x 3, no focal neuro deficits Psychiatric: Speech and behavior appropriate   ED Course   Medications - No data to display  Orders Placed This Encounter  Procedures  . DG Foot Complete Left    Standing Status:   Standing    Number of Occurrences:   1    Order Specific Question:   Reason for Exam (SYMPTOM  OR DIAGNOSIS REQUIRED)    Answer:   Midfoot tenderness, tenderness over the fifth metatarsal, first second metatarsal.  Rule out fracture, Lisfranc joint  . Apply ASO ankle    If not helpful, patient does not need to wear it.  She will wear a neoprene sleeve instead    Standing Status:   Standing    Number of Occurrences:   1    Order Specific Question:   Laterality    Answer:   Left    No results found for this or any previous visit (from the past 24 hour(s)). DG Foot Complete Left  Result Date: 05/24/2020 CLINICAL DATA:  Midfoot tenderness, tenderness over fifth metatarsal and 1/2 metatarsal, rule out fractures at Lisfranc joints EXAM: LEFT FOOT - COMPLETE 3+ VIEW COMPARISON:  07/31/2019 FINDINGS: Osseous mineralization normal. Joint space narrowing first MTP joint. Remaining joint spaces preserved. No acute fracture, dislocation, or bone destruction. IMPRESSION: Degenerative changes LEFT first MTP joint. No acute osseous abnormalities. Electronically Signed   By: Lavonia Dana M.D.   On: 05/24/2020 18:30    ED Clinical Impression  1. Left foot pain      ED Assessment/Plan  Suspect overuse syndrome.  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  Epsom salt soaks, ice or heat  whichever feels better, rest.  She is not working over the weekend.  Follow-up with Dr. Doran Durand or with podiatry at the [facility] if not better in a week with conservative therapy.  Reviewed imaging independently.  Degenerative changes left first MTP.  See radiology report for full details.  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  No fracture on x-ray. plan as above.  Discussed imaging, MDM, treatment plan, and plan for follow-up with patient.. patient agrees with plan.   Meds ordered this encounter  Medications  . ibuprofen (ADVIL) 600 MG tablet    Sig: Take 1 tablet (600 mg total) by mouth every 6 (six) hours as needed.    Dispense:  30 tablet    Refill:  0      *  This clinic note was created using [employer]. Therefore, there may be occasional mistakes despite careful proofreading.  ?    Melynda Ripple, MD 05/24/20 Einar Crow

## 2020-05-24 NOTE — Discharge Instructions (Addendum)
Your x-ray shows that you have a little arthritis at the base of your first toe, but the rest of your foot is normal.  Try the ASO or neoprene foot sleeve.  Take 600 mg of ibuprofen combined with 1000 mg of Tylenol together 3-4 times a day.  Stop all other medications.  Epson salt soaks, ice or heat, whichever feels better.  Rest for the next 2 days.  Please follow-up with Dr. Victorino Dike or with podiatry if not better in a week

## 2020-06-02 ENCOUNTER — Encounter (HOSPITAL_COMMUNITY): Payer: Self-pay | Admitting: Emergency Medicine

## 2020-06-02 ENCOUNTER — Emergency Department (HOSPITAL_COMMUNITY): Payer: Medicaid Other

## 2020-06-02 ENCOUNTER — Emergency Department (HOSPITAL_COMMUNITY)
Admission: EM | Admit: 2020-06-02 | Discharge: 2020-06-03 | Disposition: A | Discharge status: Home / Self Care | Payer: Medicaid Other | Attending: Emergency Medicine | Admitting: Emergency Medicine

## 2020-06-02 ENCOUNTER — Other Ambulatory Visit: Payer: Self-pay

## 2020-06-02 DIAGNOSIS — R0789 Other chest pain: Secondary | ICD-10-CM | POA: Insufficient documentation

## 2020-06-02 DIAGNOSIS — J189 Pneumonia, unspecified organism: Secondary | ICD-10-CM | POA: Insufficient documentation

## 2020-06-02 DIAGNOSIS — F17210 Nicotine dependence, cigarettes, uncomplicated: Secondary | ICD-10-CM | POA: Diagnosis not present

## 2020-06-02 DIAGNOSIS — R079 Chest pain, unspecified: Secondary | ICD-10-CM

## 2020-06-02 DIAGNOSIS — Z79899 Other long term (current) drug therapy: Secondary | ICD-10-CM | POA: Diagnosis not present

## 2020-06-02 LAB — CBC
HCT: 36.5 % (ref 36.0–46.0)
Hemoglobin: 11.4 g/dL — ABNORMAL LOW (ref 12.0–15.0)
MCH: 27.3 pg (ref 26.0–34.0)
MCHC: 31.2 g/dL (ref 30.0–36.0)
MCV: 87.3 fL (ref 80.0–100.0)
Platelets: 353 10*3/uL (ref 150–400)
RBC: 4.18 MIL/uL (ref 3.87–5.11)
RDW: 14.1 % (ref 11.5–15.5)
WBC: 13.2 10*3/uL — ABNORMAL HIGH (ref 4.0–10.5)
nRBC: 0 % (ref 0.0–0.2)

## 2020-06-02 NOTE — ED Triage Notes (Signed)
Pt c/o on and off central chest pain that radiates to her right shoulder blade and nausea since 0330 this morning

## 2020-06-03 ENCOUNTER — Emergency Department (HOSPITAL_COMMUNITY): Payer: Medicaid Other

## 2020-06-03 LAB — BASIC METABOLIC PANEL
Anion gap: 5 (ref 5–15)
BUN: 13 mg/dL (ref 6–20)
CO2: 23 mmol/L (ref 22–32)
Calcium: 8.1 mg/dL — ABNORMAL LOW (ref 8.9–10.3)
Chloride: 107 mmol/L (ref 98–111)
Creatinine, Ser: 0.72 mg/dL (ref 0.44–1.00)
GFR, Estimated: >60 mL/min (ref >60)
Glucose, Bld: 94 mg/dL (ref 70–99)
Potassium: 3.9 mmol/L (ref 3.5–5.1)
Sodium: 135 mmol/L (ref 135–145)

## 2020-06-03 LAB — TROPONIN I (HIGH SENSITIVITY)
Troponin I (High Sensitivity): 2 ng/L (ref <18)
Troponin I (High Sensitivity): 3 ng/L (ref <18)

## 2020-06-03 LAB — HCG, QUANTITATIVE, PREGNANCY: hCG, Beta Chain, Quant, S: 1 m[IU]/mL (ref <5)

## 2020-06-03 MED ORDER — FENTANYL CITRATE (PF) 100 MCG/2ML IJ SOLN
50.0000 ug | Freq: Once | INTRAMUSCULAR | Status: AC
  Administered 2020-06-03: 50 ug via INTRAVENOUS
  Filled 2020-06-03: qty 2

## 2020-06-03 MED ORDER — IOHEXOL 350 MG/ML SOLN
75.0000 mL | Freq: Once | INTRAVENOUS | Status: AC | PRN
  Administered 2020-06-03: 75 mL via INTRAVENOUS

## 2020-06-03 MED ORDER — DOXYCYCLINE HYCLATE 100 MG PO TABS
100.0000 mg | ORAL_TABLET | Freq: Once | ORAL | Status: AC
  Administered 2020-06-03: 100 mg via ORAL
  Filled 2020-06-03: qty 1

## 2020-06-03 MED ORDER — ACETAMINOPHEN 500 MG PO TABS
1000.0000 mg | ORAL_TABLET | Freq: Once | ORAL | Status: AC
  Administered 2020-06-03: 1000 mg via ORAL
  Filled 2020-06-03: qty 2

## 2020-06-03 MED ORDER — DOXYCYCLINE HYCLATE 100 MG PO CAPS: 100.0000 mg | ORAL_CAPSULE | Freq: Two times a day (BID) | ORAL | 0 refills | Status: DC

## 2020-06-03 NOTE — ED Provider Notes (Signed)
[facility] EMERGENCY DEPARTMENT Provider Note   CSN: [id number] Arrival date & time: 06/02/20  2206     History Chief Complaint  Patient presents with  . Chest Pain    Linda Calderon is a 45 y.o. female.  The history is provided by the patient.  Chest Pain Pain location:  R chest Pain quality: aching, radiating, sharp and stabbing   Pain radiates to:  R shoulder Pain severity:  Mild Onset quality:  Gradual Duration:  3 days Timing:  Constant Chronicity:  New Context: breathing and raising an arm   Relieved by:  None tried Worsened by:  Nothing Ineffective treatments:  None tried Associated symptoms: cough and shortness of breath   Associated symptoms: no abdominal pain, no fever and no syncope        Past Medical History:  Diagnosis Date  . ADD (attention deficit disorder)   . Chronic neck and back pain   . Pneumonia   . PTSD (post-traumatic stress disorder)   . Radiculopathy   . UTI (lower urinary tract infection)     Patient Active Problem List   Diagnosis Date Noted  . 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 Pulmonary infiltrates 07/31/2019  . Bronchiolitis 07/31/2019  . DDD (degenerative disc disease), lumbar 07/31/2019  . PTSD (post-traumatic stress disorder) 07/31/2019  . 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 Smoker 07/31/2019    Past Surgical History:  Procedure Laterality Date  . ABDOMINAL HYSTERECTOMY    . TUBAL LIGATION    . VIDEO BRONCHOSCOPY N/A 08/18/2019   Procedure: FLEXIBLE BRONCHOSCOPY WITH BRONCHOALVEOLAR LAVAGE;  Surgeon: Brand Males, MD;  Location: [facility];  Service: Cardiopulmonary;  Laterality: N/A;     OB History    Gravida  8   Para  1   Term  1   Preterm      AB  7   Living  1     SAB  7   IAB      Ectopic      Multiple      Live Births              Family History  Problem Relation Age of Onset  . Hypertension  Mother   . Hypertension Father   . Cancer Father     Social History   Tobacco Use  . Smoking status: Current Every Day Smoker    Packs/day: 0.50    Years: 10.00    Pack years: 5.00    Types: Cigarettes  . Smokeless tobacco: Never Used  Vaping Use  . Vaping Use: Never used  Substance Use Topics  . Alcohol use: No  . Drug use: Yes    Types: Marijuana    Comment: last use 06/01/20    Home Medications Prior to Admission medications   Medication Sig Start Date End Date Taking? Authorizing Provider  doxycycline (VIBRAMYCIN) 100 MG capsule Take 1 capsule (100 mg total) by mouth 2 (two) times daily. One po bid x 7 days 06/03/20  Yes Zakhari Fogel, Corene Cornea, MD  Calcium Carb-Cholecalciferol (CALCIUM-VITAMIN D) 600-400 MG-UNIT TABS Take 1 tablet by mouth daily.     [provider]  cetirizine (ZYRTEC ALLERGY) 10 MG tablet Take 1 tablet (10 mg total) by mouth daily. 09/30/19   Avegno, Darrelyn Hillock, FNP  fluticasone (FLONASE) 50 MCG/ACT nasal spray Place 1 spray into both nostrils daily for 14 days. 09/30/19 10/14/19  Avegno, Darrelyn Hillock, FNP  fluticasone furoate-vilanterol (  BREO ELLIPTA) 100-25 MCG/INH AEPB Inhale 1 puff into the lungs daily.    [provider]  ibuprofen (ADVIL) 600 MG tablet Take 1 tablet (600 mg total) by mouth every 6 (six) hours as needed. 05/24/20   Melynda Ripple, MD  menthol-cetylpyridinium (CEPACOL REGULAR STRENGTH) 3 MG lozenge Take 1 lozenge (3 mg total) by mouth as needed for sore throat. 09/30/19   Avegno, Darrelyn Hillock, FNP  Multiple Vitamins-Minerals (MULTIVITAMIN WITH MINERALS) tablet Take 1 tablet by mouth daily.    [provider]  Nicotine 21-14-7 MG/24HR KIT Use as directed. 08/18/19   Brand Males, MD    Allergies    Morphine and related  Review of Systems   Review of Systems  Constitutional: Negative for fever.  Respiratory: Positive for cough and shortness of breath.   Cardiovascular: Positive for chest pain. Negative for syncope.   Gastrointestinal: Negative for abdominal pain.  All other systems reviewed and are negative.   Physical Exam Updated Vital Signs BP 103/70   Pulse (!) 104   Temp 98.4 F (36.9 C)   Resp (!) 21   Ht _0  (1.626 m)   Wt 44.9 kg   LMP 03/09/2011   SpO2 (!) 88%   BMI 16.99 kg/m   Physical Exam Vitals and nursing note reviewed.  Constitutional:      Appearance: She is well-developed.  HENT:     Head: Normocephalic and atraumatic.     Mouth/Throat:     Mouth: Mucous membranes are moist.     Pharynx: Oropharynx is clear.  Eyes:     Pupils: Pupils are equal, round, and reactive to light.  Cardiovascular:     Rate and Rhythm: Normal rate and regular rhythm.  Pulmonary:     Effort: No respiratory distress.     Breath sounds: No stridor. Decreased breath sounds present. No wheezing or rhonchi.  Chest:     Chest wall: No mass.  Abdominal:     General: Abdomen is flat. There is no distension.  Musculoskeletal:        General: No swelling or tenderness. Normal range of motion.     Cervical back: Normal range of motion.     Right lower leg: No edema.     Left lower leg: No edema.  Skin:    General: Skin is warm and dry.  Neurological:     General: No focal deficit present.     Mental Status: She is alert.  Psychiatric:        Mood and Affect: Mood normal.     ED Results / Procedures / Treatments   Labs (all labs ordered are listed, but only abnormal results are displayed) Labs Reviewed  BASIC METABOLIC PANEL - Abnormal; Notable for the following components:      Result Value   Calcium 8.1 (*)    All other components within normal limits  CBC - Abnormal; Notable for the following components:   WBC 13.2 (*)    Hemoglobin 11.4 (*)    All other components within normal limits  HCG, QUANTITATIVE, PREGNANCY  TROPONIN I (HIGH SENSITIVITY)  TROPONIN I (HIGH SENSITIVITY)    EKG EKG Interpretation  Date/Time:  Sunday Jun 02 2020 22:14:56 EDT Ventricular Rate:   94 PR Interval:  134 QRS Duration: 78 QT Interval:  314 QTC Calculation: 392 R Axis:   83 Text Interpretation: Normal sinus rhythm Right atrial enlargement Cannot rule out Anterior infarct , age undetermined Abnormal ECG Confirmed by Merrily Pew [phone])  on 06/02/2020 11:06:36 PM   Radiology DG Chest 2 View  Result Date: 06/03/2020 CLINICAL DATA:  Initial evaluation for acute chest pain, cough. EXAM: CHEST - 2 VIEW COMPARISON:  Prior radiograph from 09/05/2016 FINDINGS: Cardiac and mediastinal silhouettes are within normal limits. Lungs are mildly hyperinflated. Parenchymal opacity partially silhouetting the right heart border, consistent with acute infiltrate, concerning for pneumonia. This appears to involve the right upper and middle lobes on lateral view. No other focal airspace disease. No edema or effusion. No pneumothorax. Scoliosis noted.  No acute osseous finding. IMPRESSION: Acute right upper and middle lobe infiltrate, concerning for pneumonia. Follow-up examination in 4-6 weeks following a course of antibiotic therapy recommended to ensure resolution, and to ensure no underlying malignancy is present. Electronically Signed   By: Jeannine Boga M.D.   On: 06/03/2020 01:45   CT Angio Chest PE W and/or Wo Contrast  Result Date: 06/03/2020 CLINICAL DATA:  On and off central air pain/on and off central chest pain radiating to the right shoulder since this morning EXAM: CT ANGIOGRAPHY CHEST WITH CONTRAST TECHNIQUE: Multidetector CT imaging of the chest was performed using the standard protocol during bolus administration of intravenous contrast. Multiplanar CT image reconstructions and MIPs were obtained to evaluate the vascular anatomy. CONTRAST:  48m OMNIPAQUE IOHEXOL 350 MG/ML SOLN COMPARISON:  07/10/2019 FINDINGS: Cardiovascular: Satisfactory opacification of the pulmonary arteries to the segmental level. No evidence of pulmonary embolism when allowing for intermittent motion  artifact. Normal heart size. No pericardial effusion. Mediastinum/Nodes: Mild reactive nodal prominence.  Goiter. Lungs/Pleura: Dense consolidation in the anterior segment right upper lobe. There are additional patchy areas of interval airspace disease spread bilaterally. Emphysema and clusters of reticulonodular opacity. In the setting of acute lung disease it is difficult to reassess dominant pulmonary nodules described previously. There is a chronic appearing nodule in the subpleural left upper lobe measuring 8 mm on 6:46, which is progressive. Also larger is a subpleural nodule in the right upper lobe anteriorly on 6:57, now 7 mm. No edema, effusion, or pneumothorax. Upper Abdomen: Left upper pole renal cyst. Musculoskeletal: No acute finding Review of the MIP images confirms the above findings. IMPRESSION: 1. Multifocal pneumonia with segmental consolidation in the right upper lobe. 2. Chronic lung disease. There are progressive pulmonary nodules in the bilateral lung measuring up to 8 mm. Nodule assessment is difficult in the setting of acute disease, recommend noncontrast chest CT in 3-6 months in this patient with emphysema. 3. Negative for pulmonary embolism. Electronically Signed   By: JMonte FantasiaM.D.   On: 06/03/2020 04:23    Procedures Procedures   Medications Ordered in ED Medications  iohexol (OMNIPAQUE) 350 MG/ML injection 75 mL (75 mLs Intravenous Contrast Given 06/03/20 0120)  fentaNYL (SUBLIMAZE) injection 50 mcg (50 mcg Intravenous Given 06/03/20 0400)  acetaminophen (TYLENOL) tablet 1,000 mg (1,000 mg Oral Given 06/03/20 0400)  doxycycline (VIBRA-TABS) tablet 100 mg (100 mg Oral Given 06/03/20 0449)    ED Course  I have reviewed the triage vital signs and the nursing notes.  Pertinent labs & imaging results that were available during my care of the patient were reviewed by me and considered in my medical decision making (see chart for details).    MDM Rules/Calculators/A&P                          Xray looks like pneumonia on my read but no real pneumonia symptoms so will ct to eval for  PE.  CT was just pneumonia.  Will treat for same.  Patient ambulated without difficulty.  No dyspnea.  Her oxygen saturation stayed above 95%.  Patient will have some intermittent episodes of 89 to 91% while at rest I think this is probably from splinting secondary to the discomfort.  When she is taking deep breaths her oxygen is close to 100. Will dc w/ pcp follow up to ensure improvement.   Final Clinical Impression(s) / ED Diagnoses Final diagnoses:  Nonspecific chest pain  Community acquired pneumonia of right upper lobe of lung    Rx / DC Orders ED Discharge Orders         Ordered    doxycycline (VIBRAMYCIN) 100 MG capsule  2 times daily        06/03/20 0501           Ozro Russett, Corene Cornea, MD 06/03/20 [phone]

## 2020-06-03 NOTE — ED Notes (Signed)
Pt ambulated around nurses station. o2 Saturation 96-97% Pt had no complaints of shortness of breath just tiredness

## 2020-06-29 ENCOUNTER — Telehealth: Payer: Medicaid Other | Admitting: Orthopedic Surgery

## 2020-06-29 DIAGNOSIS — K0889 Other specified disorders of teeth and supporting structures: Secondary | ICD-10-CM

## 2020-06-29 MED ORDER — AMOXICILLIN 500 MG PO CAPS: 500.0000 mg | ORAL_CAPSULE | Freq: Three times a day (TID) | ORAL | 0 refills | Status: AC

## 2020-06-29 MED ORDER — NAPROXEN 500 MG PO TBEC: 500.0000 mg | DELAYED_RELEASE_TABLET | Freq: Two times a day (BID) | ORAL | 0 refills | Status: AC

## 2020-06-29 NOTE — Progress Notes (Signed)
E-Visit for Dental Pain  We are sorry that you are not feeling well.  Here is how we plan to help!  Based on what you have shared with me in the questionnaire, it sounds like you have an infected tooth/gum.  naprosyn 500mg  2 times per day for 7 days for discomfort and Amoxicillin 500mg  3 times per day for 10 days  It is imperative that you see a dentist within 10 days of this eVisit to determine the cause of the dental pain and be sure it is adequately treated  A toothache or tooth pain is caused when the nerve in the root of a tooth or surrounding a tooth is irritated. Dental (tooth) infection, decay, injury, or loss of a tooth are the most common causes of dental pain. Pain may also occur after an extraction (tooth is pulled out). Pain sometimes originates from other areas and radiates to the jaw, thus appearing to be tooth pain.Bacteria growing inside your mouth can contribute to gum disease and dental decay, both of which can cause pain. A toothache occurs from inflammation of the central portion of the tooth called pulp. The pulp contains nerve endings that are very sensitive to pain. Inflammation to the pulp or pulpitis may be caused by dental cavities, trauma, and infection.    HOME CARE:   For toothaches: Over-the-counter pain medications such as acetaminophen or ibuprofen may be used. Take these as directed on the package while you arrange for a dental appointment. Avoid very cold or hot foods, because they may make the pain worse. You may get relief from biting on a cotton ball soaked in oil of cloves. You can get oil of cloves at most drug stores.  For jaw pain:  Aspirin may be helpful for problems in the joint of the jaw in adults. If pain happens every time you open your mouth widely, the temporomandibular joint (TMJ) may be the source of the pain. Yawning or taking a large bite of food may worsen the pain. An appointment with your doctor or dentist will help you find the cause.      GET HELP RIGHT AWAY IF:  You have a high fever or chills If you have had a recent head or face injury and develop headache, light headedness, nausea, vomiting, or other symptoms that concern you after an injury to your face or mouth, you could have a more serious injury in addition to your dental injury. A facial rash associated with a toothache: This condition may improve with medication. Contact your doctor for them to decide what is appropriate. Any jaw pain occurring with chest pain: Although jaw pain is most commonly caused by dental disease, it is sometimes referred pain from other areas. People with heart disease, especially people who have had stents placed, people with diabetes, or those who have had heart surgery may have jaw pain as a symptom of heart attack or angina. If your jaw or tooth pain is associated with lightheadedness, sweating, or shortness of breath, you should see a doctor as soon as possible. Trouble swallowing or excessive pain or bleeding from gums: If you have a history of a weakened immune system, diabetes, or steroid use, you may be more susceptible to infections. Infections can often be more severe and extensive or caused by unusual organisms. Dental and gum infections in people with these conditions may require more aggressive treatment. An abscess may need draining or IV antibiotics, for example.  MAKE SURE YOU   Understand these  instructions. Will watch your condition. Will get help right away if you are not doing well or get worse.  Thank you for choosing an e-visit. Your e-visit answers were reviewed by a board certified advanced clinical practitioner to complete your personal care plan. Depending upon the condition, your plan could have included both over the counter or prescription medications. Please review your pharmacy choice. Make sure the pharmacy is open so you can pick up prescription now. If there is a problem, you may contact your provider through  [employer] and have the prescription routed to another pharmacy. Your safety is important to [place]. If you have drug allergies check your prescription carefully.  For the next 24 hours you can use MyChart to ask questions about today's visit, request a non-urgent call back, or ask for a work or school excuse. You will get an email in the next two days asking about your experience. I hope that your e-visit has been valuable and will speed your recovery.  Greater than 5 minutes, yet less than 10 minutes of time have been spent researching, coordinating and implementing care for this patient today.

## 2020-08-12 ENCOUNTER — Telehealth: Payer: Medicaid Other

## 2020-08-23 ENCOUNTER — Ambulatory Visit
Admission: RE | Admit: 2020-08-23 | Discharge: 2020-08-23 | Disposition: A | Discharge status: Home / Self Care | Payer: Medicaid Other | Source: Ambulatory Visit | Attending: Emergency Medicine | Admitting: Emergency Medicine

## 2020-08-23 VITALS — BP 100/67 | HR 100 | Temp 98.0°F | Resp 18

## 2020-08-23 DIAGNOSIS — J069 Acute upper respiratory infection, unspecified: Secondary | ICD-10-CM

## 2020-08-23 MED ORDER — BENZONATATE 100 MG PO CAPS: 100.0000 mg | ORAL_CAPSULE | Freq: Three times a day (TID) | ORAL | 0 refills | Status: DC

## 2020-08-23 NOTE — ED Provider Notes (Signed)
[place]   [id number] 08/23/20 Arrival Time: 3151   CC: COVID symptoms  SUBJECTIVE: History from: patient.  Linda Calderon is a 45 y.o. female who presents with fever, cough, headache x 1 day.  Denies sick exposure to COVID, flu or strep.  Has NOT tried OTC medications without relief.  Denies aggravating factors.  Denies previous covid infection in the past.   Denies SOB, wheezing, chest pain, nausea, changes in bowel or bladder habits.     ROS: As per HPI.  All other pertinent ROS negative.     Past Medical History:  Diagnosis Date   ADD (attention deficit disorder)    Chronic neck and back pain    Pneumonia    PTSD (post-traumatic stress disorder)    Radiculopathy    UTI (lower urinary tract infection)    Past Surgical History:  Procedure Laterality Date   ABDOMINAL HYSTERECTOMY     TUBAL LIGATION     VIDEO BRONCHOSCOPY N/A 08/18/2019   Procedure: FLEXIBLE BRONCHOSCOPY WITH BRONCHOALVEOLAR LAVAGE;  Surgeon: Brand Males, MD;  Location: [facility];  Service: Cardiopulmonary;  Laterality: N/A;   Allergies  Allergen Reactions   Morphine And Related     Dropped blood pressure, had to lay patient flat and administer IV   No current facility-administered medications on file prior to encounter.   Current Outpatient Medications on File Prior to Encounter  Medication Sig Dispense Refill   Calcium Carb-Cholecalciferol (CALCIUM-VITAMIN D) 600-400 MG-UNIT TABS Take 1 tablet by mouth daily.      cetirizine (ZYRTEC ALLERGY) 10 MG tablet Take 1 tablet (10 mg total) by mouth daily. 30 tablet 0   fluticasone (FLONASE) 50 MCG/ACT nasal spray Place 1 spray into both nostrils daily for 14 days. 16 g 0   fluticasone furoate-vilanterol (BREO ELLIPTA) 100-25 MCG/INH AEPB Inhale 1 puff into the lungs daily.     ibuprofen (ADVIL) 600 MG tablet Take 1 tablet (600 mg total) by mouth every 6 (six) hours as needed. 30 tablet 0   menthol-cetylpyridinium (CEPACOL REGULAR STRENGTH)  3 MG lozenge Take 1 lozenge (3 mg total) by mouth as needed for sore throat. 100 tablet 0   Multiple Vitamins-Minerals (MULTIVITAMIN WITH MINERALS) tablet Take 1 tablet by mouth daily.     Nicotine 21-14-7 MG/24HR KIT Use as directed. 1 kit 0   Social History   Socioeconomic History   Marital status: Married    Spouse name: Not on file   Number of children: Not on file   Years of education: Not on file   Highest education level: Not on file  Occupational History   Not on file  Tobacco Use   Smoking status: Every Day    Packs/day: 0.50    Years: 10.00    Pack years: 5.00    Types: Cigarettes   Smokeless tobacco: Never  Vaping Use   Vaping Use: Never used  Substance and Sexual Activity   Alcohol use: No   Drug use: Yes    Types: Marijuana    Comment: last use 06/01/20   Sexual activity: Yes    Birth control/protection: Surgical  Other Topics Concern   Not on file  Social History Narrative   Not on file   Social Determinants of Health   Financial Resource Strain: Not on file  Food Insecurity: Not on file  Transportation Needs: Not on file  Physical Activity: Not on file  Stress: Not on file  Social Connections: Not on file  Intimate Partner Violence:  Not on file   Family History  Problem Relation Age of Onset   Hypertension Mother    Hypertension Father    Cancer Father     OBJECTIVE:  Vitals:   08/23/20 1907  BP: 100/67  Pulse: 100  Resp: 18  Temp: 98 F (36.7 C)  TempSrc: Oral  SpO2: 96%    General appearance: alert; well-appearing, nontoxic; speaking in full sentences and tolerating own secretions HEENT: NCAT; Ears: EACs clear, TMs pearly gray; Eyes: PERRL.  EOM grossly intact.Nose: nares patent without rhinorrhea, Throat: oropharynx clear, tonsils non erythematous or enlarged, uvula midline  Neck: supple without LAD Lungs: unlabored respirations, symmetrical air entry; cough: absent; no respiratory distress; CTAB Heart: regular rate and rhythm.   Skin: warm and dry Psychological: alert and cooperative; normal mood and affect   ASSESSMENT & PLAN:  1. Viral URI with cough     Meds ordered this encounter  Medications   benzonatate (TESSALON) 100 MG capsule    Sig: Take 1 capsule (100 mg total) by mouth every 8 (eight) hours.    Dispense:  21 capsule    Refill:  0    Order Specific Question:   Supervising Provider    Answer:   Raylene Everts [[id number]]   COVID testing ordered.  It will take between 5-7 days for test results.  Someone will contact you regarding abnormal results.    In the meantime: You should remain isolated in your home for 5 days from symptom onset AND greater than 72 hours after symptoms resolution (absence of fever without the use of fever-reducing medication and improvement in respiratory symptoms), whichever is longer Get plenty of rest and push fluids Tessalon Perles prescribed for cough Use OTC zyrtec for nasal congestion, runny nose, and/or sore throat Use OTC flonase for nasal congestion and runny nose Use medications daily for symptom relief Use OTC medications like ibuprofen or tylenol as needed fever or pain Call or go to the ED if you have any new or worsening symptoms such as fever, worsening cough, shortness of breath, chest tightness, chest pain, turning blue, changes in mental status, etc...   Reviewed expectations re: course of current medical issues. Questions answered. Outlined signs and symptoms indicating need for more acute intervention. Patient verbalized understanding. After Visit Summary given.          Lestine Box, PA-C 08/23/20 1918

## 2020-08-23 NOTE — ED Triage Notes (Signed)
Fever, cough, headache that started yesterday .  Neg at home covid test.

## 2020-08-23 NOTE — Discharge Instructions (Addendum)

## 2020-08-26 LAB — NOVEL CORONAVIRUS, NAA: SARS-CoV-2, NAA: DETECTED — AB

## 2020-09-23 ENCOUNTER — Ambulatory Visit: Payer: Medicaid Other

## 2020-10-30 ENCOUNTER — Telehealth: Payer: Self-pay

## 2020-10-30 NOTE — Telephone Encounter (Signed)
NOTES SCANNED TO REFERRAL 

## 2020-11-29 ENCOUNTER — Ambulatory Visit: Payer: Medicaid Other | Admitting: Cardiology

## 2020-12-02 ENCOUNTER — Encounter: Payer: Self-pay | Admitting: Cardiology

## 2020-12-28 ENCOUNTER — Ambulatory Visit: Payer: Medicaid Other

## 2021-01-11 IMAGING — CT CT ABD-PELV W/ CM
3 of 5 series · 15 of 46 positions shown, 16 images · IV contrast (omnipaque)
Comparison: CT 10/17/2015

CLINICAL DATA: Right lower quadrant abdominal pain with nausea and
diarrhea for 2 days

EXAM:
CT ABDOMEN AND PELVIS WITH CONTRAST
TECHNIQUE: Multidetector CT imaging of the abdomen and pelvis was performed
using the standard protocol following bolus administration of
intravenous contrast.
CONTRAST:  100mL OMNIPAQUE IOHEXOL 300 MG/ML  SOLN

[Series 3: abdomen 5.0 · axial · 0.67mm/px · z∈[+804,+1104]mm · 7 of 80 slices shown, 8 images]
[im 10/80  soft-tissue]
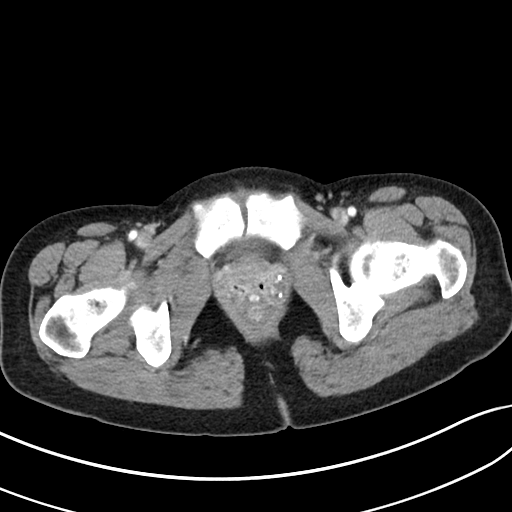
[im 10/80  bone]
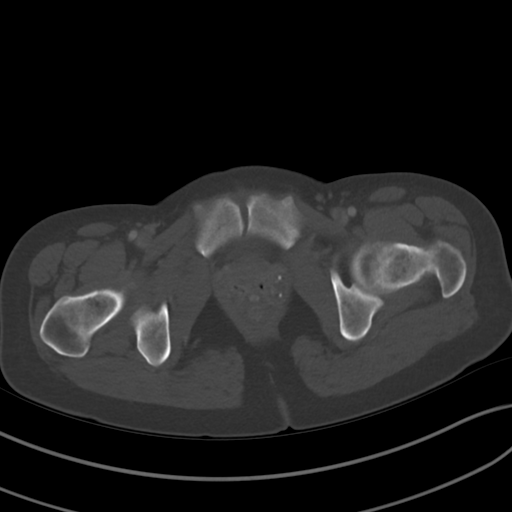
[im 20/80  soft-tissue]
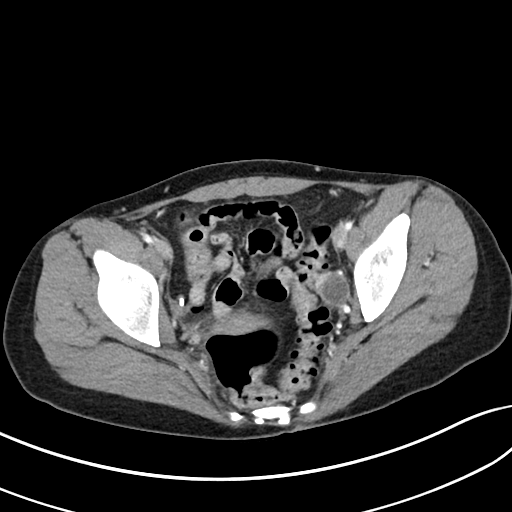
[im 30/80  soft-tissue]
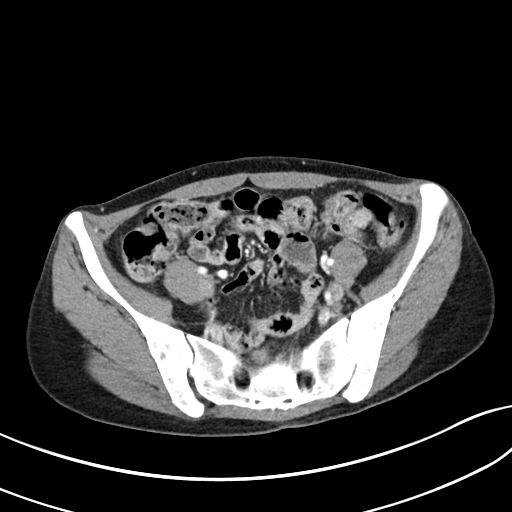
[im 40/80  soft-tissue]
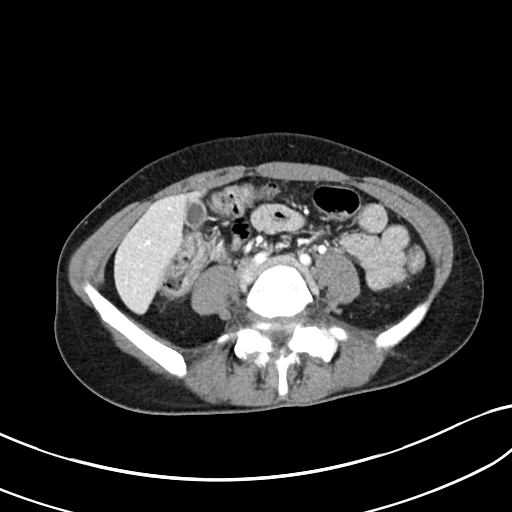
[im 50/80  soft-tissue]
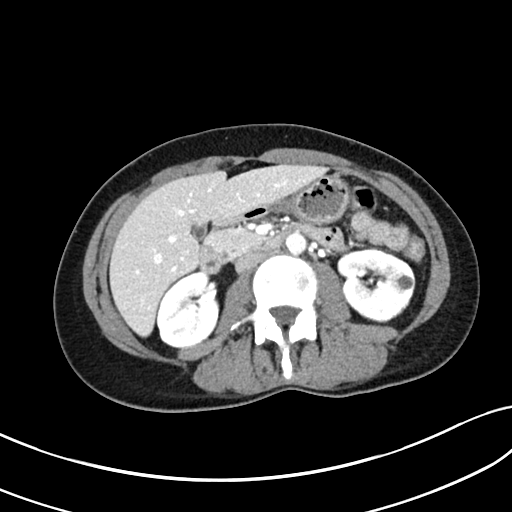
[im 60/80  soft-tissue]
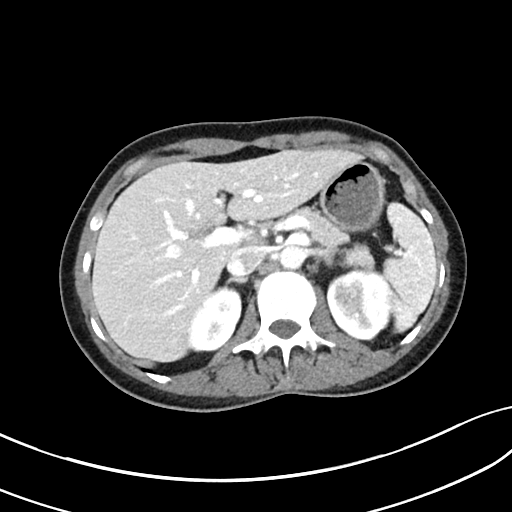
[im 70/80  soft-tissue]
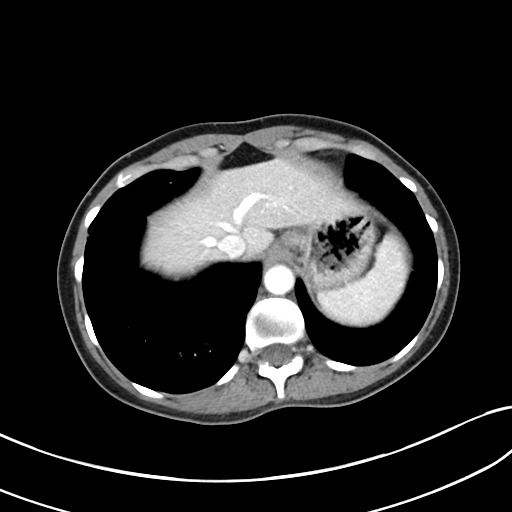

[Series 5: lung · axial · 0.67mm/px · z∈[+796,+986]mm · 5 of 200 slices shown]
[im 21/200  bone]
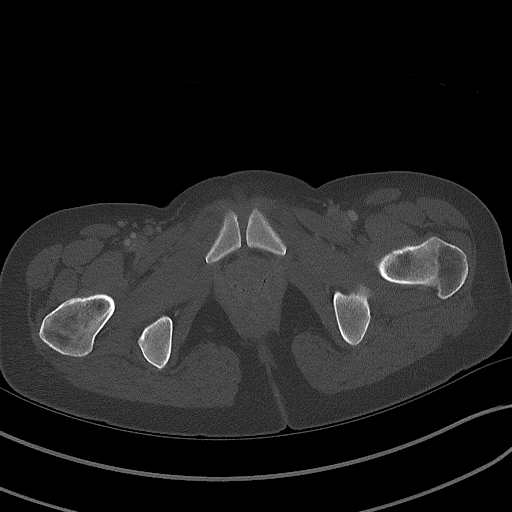
[im 42/200  bone]
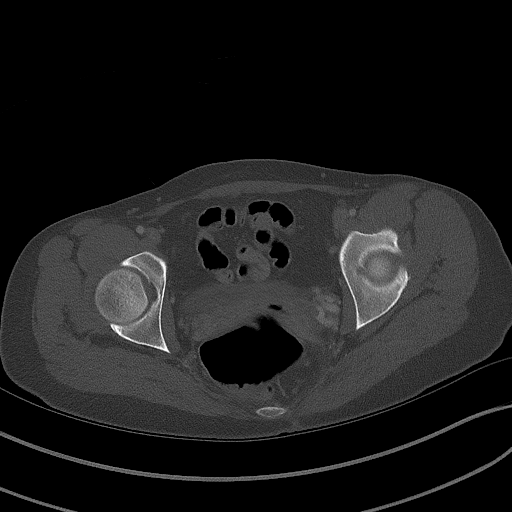
[im 63/200  bone]
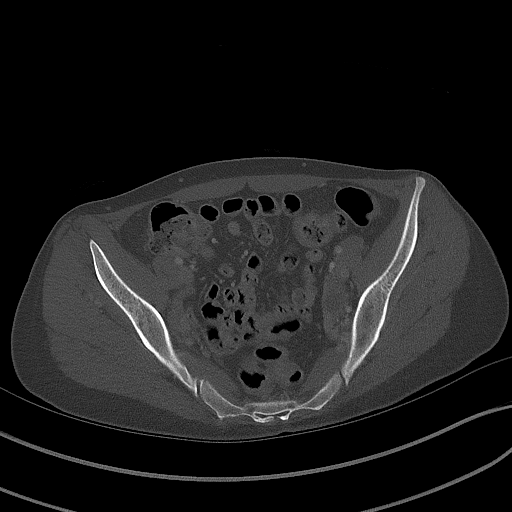
[im 84/200  bone]
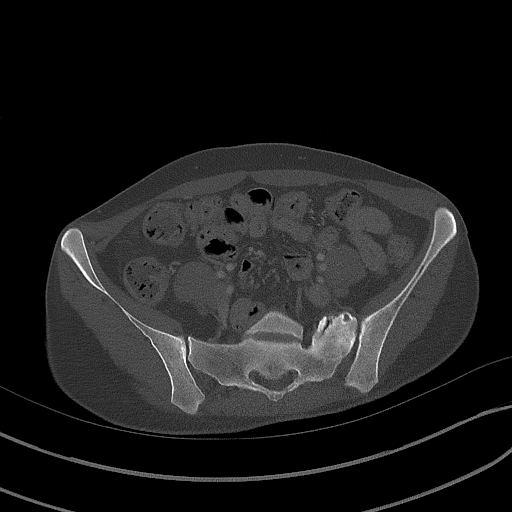
[im 116/200  bone]
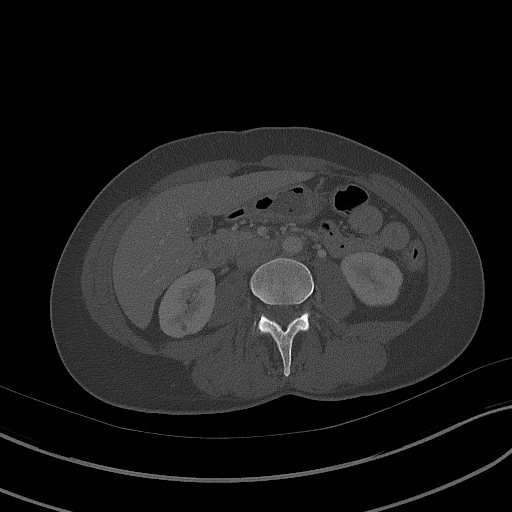

[Series 6: abdomen 3.0 mpr cor · coronal · 0.60mm/px · 3 of 73 slices shown]
[im 25/73  soft-tissue]
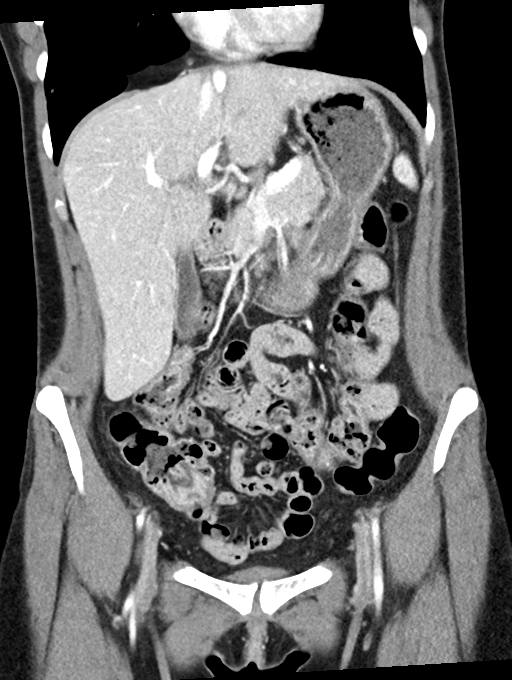
[im 33/73  soft-tissue]
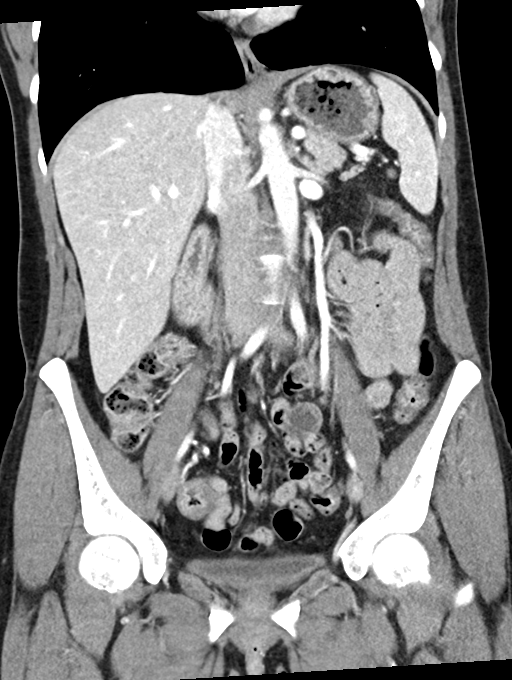
[im 41/73  soft-tissue]
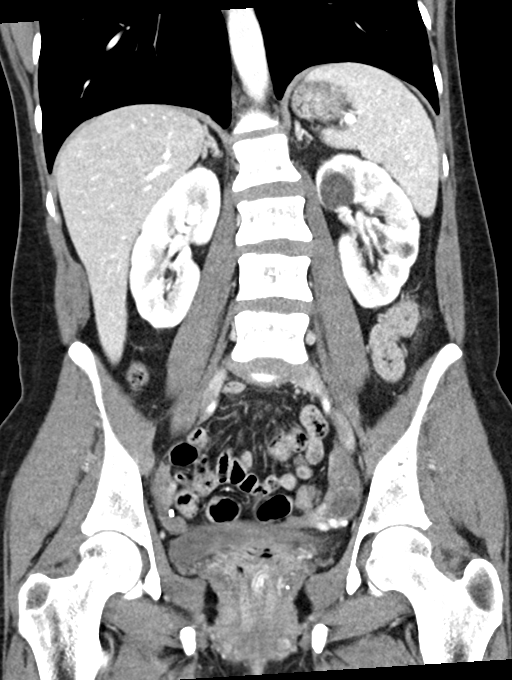

[15 of 46 positions shown; findings below may reference images not displayed]

FINDINGS: Lower chest: Chronic centrilobular ground-glass nodularity within
the middle lobe and lingula. Bilateral lower lobes are clear with
right lower lobe inferior accessory fissure. Normal heart size. No
pericardial effusion.

Hepatobiliary: Tiny subcentimeter hypoattenuating focus along the
anterior left lobe liver ([DATE]) too small to fully characterize on
CT imaging but statistically likely benign. no worrisome focal liver
lesions. Smooth liver surface contour. Normal hepatic attenuation.
Normal gallbladder. No visible calcified gallstones. Normal biliary
tree.

Pancreas: Unremarkable. No pancreatic ductal dilatation or
surrounding inflammatory changes.

Spleen: Normal in size without focal abnormality.

Adrenals/Urinary Tract: Normal adrenal glands. Kidneys are normally
located with symmetric enhancement and excretion. 2.4 cm fluid
attenuation cyst seen in the upper pole right kidney with additional
smaller 1.2 cm 1 cm cyst in the interpolar left kidney. Additional
smaller subcentimeter hypoattenuating foci in both kidneys too small
to fully characterize on CT imaging but statistically likely benign.
No suspicious renal lesion, urolithiasis or hydronephrosis. Urinary
bladder is largely decompressed at the time of exam and therefore
poorly evaluated by CT imaging. No gross bladder abnormality is
seen.

Stomach/Bowel: Distal esophagus, stomach and duodenal sweep are
unremarkable. No small bowel wall thickening or dilatation. A normal
appendix is visualized. There is some mild pancolonic mural
thickening with mucosal hyperemia with minimal if any significant
pericolonic stranding. No evidence of obstruction.

Vascular/Lymphatic: No significant vascular findings are present. No
enlarged abdominal or pelvic lymph nodes.

Reproductive: Uterus is surgically absent. Retained ovarian tissue.
1.9 cm dominant follicle in the left ovary. Small follicles in the
right ovary. No concerning adnexal lesions.

Other: No abdominopelvic free fluid or free gas. No bowel containing
hernias.

Musculoskeletal: No acute osseous abnormality or suspicious osseous
lesion. Levocurvature of the lumbar spine with an apex at the L3
level. There is partial lumbarization of the left L5 transverse
process with marked arthrosis at the articulation with the superior
sacral ala. Grade 1 anterolisthesis L4 on L5 with facet arthropathy
but no clear pars defect/spondylolysis.
IMPRESSION: 1. Mild pancolonic mural thickening with mucosal hyperemia. Findings
are nonspecific but may represent a mild colitis of infectious or
inflammatory etiology in the appropriate clinical setting.
2. No other acute abdominopelvic finding to provide cause for
patient's symptoms. Specifically, the appendix is normal.
3. Chronic centrilobular ground-glass nodularity within the middle
lobe and lingula. Nodularity has been present for numerous years.
This pattern of disease is often seen with atypical mycobacterial
infection, mycobacterium avium complex. Consider pulmonology
consultation on an outpatient basis if not previously obtained.
4. Pseudoarthrosis of the left L5 transverse process and adjacent
sacral ala. Could correlate for clinical features of Bertolotti
syndrome.
5. Grade 1 anterolisthesis L4 on L5 with facet arthropathy but no
clear pars defect/spondylolysis.

## 2021-02-14 ENCOUNTER — Ambulatory Visit (INDEPENDENT_AMBULATORY_CARE_PROVIDER_SITE_OTHER): Payer: Medicaid Other

## 2021-02-14 ENCOUNTER — Other Ambulatory Visit: Payer: Self-pay

## 2021-02-14 ENCOUNTER — Ambulatory Visit
Admission: RE | Admit: 2021-02-14 | Discharge: 2021-02-14 | Disposition: A | Discharge status: Home / Self Care | Payer: Medicaid Other | Source: Ambulatory Visit | Attending: Emergency Medicine | Admitting: Emergency Medicine

## 2021-02-14 VITALS — BP 106/78 | HR 100 | Temp 97.9°F | Resp 18

## 2021-02-14 DIAGNOSIS — J189 Pneumonia, unspecified organism: Secondary | ICD-10-CM | POA: Diagnosis not present

## 2021-02-14 DIAGNOSIS — R0602 Shortness of breath: Secondary | ICD-10-CM | POA: Diagnosis not present

## 2021-02-14 MED ORDER — ALBUTEROL SULFATE HFA 108 (90 BASE) MCG/ACT IN AERS: 1.0000 | INHALATION_SPRAY | RESPIRATORY_TRACT | 0 refills | Status: DC | PRN

## 2021-02-14 MED ORDER — PROMETHAZINE-DM 6.25-15 MG/5ML PO SYRP: 5.0000 mL | ORAL_SOLUTION | Freq: Four times a day (QID) | ORAL | 0 refills | Status: DC | PRN

## 2021-02-14 MED ORDER — AEROCHAMBER PLUS MISC: 2 refills | Status: DC

## 2021-02-14 MED ORDER — FLUTICASONE PROPIONATE 50 MCG/ACT NA SUSP: 2.0000 | Freq: Every day | NASAL | 0 refills | Status: DC

## 2021-02-14 MED ORDER — IBUPROFEN 600 MG PO TABS: 600.0000 mg | ORAL_TABLET | Freq: Four times a day (QID) | ORAL | 0 refills | Status: DC | PRN

## 2021-02-14 MED ORDER — AMOXICILLIN 500 MG PO TABS: 1000.0000 mg | ORAL_TABLET | Freq: Three times a day (TID) | ORAL | 0 refills | Status: AC

## 2021-02-14 NOTE — ED Provider Notes (Signed)
HPI  SUBJECTIVE:  Linda Calderon is a 46 y.o. female who presents with 3 weeks of an upper respiratory infection with a cough, headache, nasal congestion, rhinorrhea.  She reports wheezing, shortness of breath, dyspnea on exertion, sharp, seconds long left sided chest pain that is present only with deep inspiration, coughing and bending in certain ways.  She states this feels similar to her previous pneumonia.  No fevers above 100.4, body aches, sinus pain or pressure.  There is no exertional component to her chest pain, no accompanying nausea, diaphoresis, radiation of this pain up her neck, down her arm, through to her back.  No antibiotics in the past 3 months.  She took an antipyretic within 6 hours of evaluation.  She has been taking ibuprofen 400 mg twice daily, Tylenol Cold and flu, and Goody powders with improvement in her symptoms.  Symptoms are worse with cough, movement, deep inspiration.  She is waking up at night coughing.  She has a past medical history of pneumonia, rheumatoid arthritis, currently not on any DMARDs or prednisone, and she smokes.  No history of pulmonary disease, hypertension, MI, hypercholesterolemia, PAD/PVD, CVA, coronary disease, BMI above 30.  Family history negative for MI.  LMP: Status post hysterectomy.  PMD: [facility].   Past Medical History:  Diagnosis Date   ADD (attention deficit disorder)    Chronic neck and back pain    Pneumonia    PTSD (post-traumatic stress disorder)    Radiculopathy    UTI (lower urinary tract infection)     Past Surgical History:  Procedure Laterality Date   ABDOMINAL HYSTERECTOMY     TUBAL LIGATION     VIDEO BRONCHOSCOPY N/A 08/18/2019   Procedure: FLEXIBLE BRONCHOSCOPY WITH BRONCHOALVEOLAR LAVAGE;  Surgeon: Brand Males, MD;  Location: [facility];  Service: Cardiopulmonary;  Laterality: N/A;    Family History  Problem Relation Age of Onset   Hypertension Mother    Hypertension Father    Cancer Father      Social History   Tobacco Use   Smoking status: Every Day    Packs/day: 0.50    Years: 10.00    Pack years: 5.00    Types: Cigarettes   Smokeless tobacco: Never  Vaping Use   Vaping Use: Never used  Substance Use Topics   Alcohol use: No   Drug use: Yes    Types: Marijuana    Comment: last use 06/01/20    No current facility-administered medications for this encounter.  Current Outpatient Medications:    albuterol (VENTOLIN HFA) 108 (90 Base) MCG/ACT inhaler, Inhale 1-2 puffs into the lungs every 4 (four) hours as needed for wheezing or shortness of breath., Disp: 1 each, Rfl: 0   ALPRAZolam (XANAX) 1 MG tablet, Take 1 mg by mouth 2 (two) times daily., Disp: , Rfl:    amoxicillin (AMOXIL) 500 MG tablet, Take 2 tablets (1,000 mg total) by mouth 3 (three) times daily for 5 days., Disp: 30 tablet, Rfl: 0   amphetamine-dextroamphetamine (ADDERALL) 20 MG tablet, Take 20 mg by mouth daily., Disp: , Rfl:    fluticasone (FLONASE) 50 MCG/ACT nasal spray, Place 2 sprays into both nostrils daily., Disp: 16 g, Rfl: 0   ibuprofen (ADVIL) 600 MG tablet, Take 1 tablet (600 mg total) by mouth every 6 (six) hours as needed., Disp: 30 tablet, Rfl: 0   promethazine-dextromethorphan (PROMETHAZINE-DM) 6.25-15 MG/5ML syrup, Take 5 mLs by mouth 4 (four) times daily as needed for cough., Disp: 118 mL, Rfl:  0   Spacer/Aero-Holding Chambers (AEROCHAMBER PLUS) inhaler, Use with inhaler, Disp: 1 each, Rfl: 2   Calcium Carb-Cholecalciferol (CALCIUM-VITAMIN D) 600-400 MG-UNIT TABS, Take 1 tablet by mouth daily. , Disp: , Rfl:    fluticasone furoate-vilanterol (BREO ELLIPTA) 100-25 MCG/INH AEPB, Inhale 1 puff into the lungs daily., Disp: , Rfl:    menthol-cetylpyridinium (CEPACOL REGULAR STRENGTH) 3 MG lozenge, Take 1 lozenge (3 mg total) by mouth as needed for sore throat., Disp: 100 tablet, Rfl: 0   Multiple Vitamins-Minerals (MULTIVITAMIN WITH MINERALS) tablet, Take 1 tablet by mouth daily., Disp: , Rfl:     Nicotine 21-14-7 MG/24HR KIT, Use as directed., Disp: 1 kit, Rfl: 0  Allergies  Allergen Reactions   Morphine And Related     Dropped blood pressure, had to lay patient flat and administer IV     ROS  As noted in HPI.   Physical Exam  BP 106/78 (BP Location: Right Arm)    Pulse 100    Temp 97.9 F (36.6 C) (Oral)    Resp 18    LMP 03/09/2011    SpO2 94%   Constitutional: Well developed, well nourished, no acute distress Eyes:  EOMI, conjunctiva normal bilaterally HENT: Normocephalic, atraumatic,mucus membranes moist.  Positive nasal congestion.  No sinus tenderness Respiratory: Normal inspiratory effort, fair air movement.  Lungs clear bilaterally.  Positive reproducible left chest wall tenderness Cardiovascular: Borderline regular tachycardia, no murmurs, rubs, gallops. GI: nondistended skin: No rash, skin intact Musculoskeletal: no deformities Neurologic: Alert & oriented x 3, no focal neuro deficits Psychiatric: Speech and behavior appropriate   ED Course   Medications - No data to display  Orders Placed This Encounter  Procedures   DG Chest 2 View    Standing Status:   Standing    Number of Occurrences:   1    Order Specific Question:   Reason for Exam (SYMPTOM  OR DIAGNOSIS REQUIRED)    Answer:   cough, pain, SOB    No results found for this or any previous visit (from the past 24 hour(s)). DG Chest 2 View  Result Date: 02/14/2021 CLINICAL DATA:  Shortness of breath EXAM: CHEST - 2 VIEW COMPARISON:  Chest x-ray dated Jun 02, 2020 FINDINGS: Cardiac and mediastinal contours are within normal limits. Emphysema. Bilateral nodular opacities and bronchial wall thickening. No pleural effusion or pneumothorax. IMPRESSION: Bilateral nodular opacities and bronchial wall thickening, likely infectious. Recommend CT for further evaluation. Electronically Signed   By: Yetta Glassman M.D.   On: 02/14/2021 14:57    ED Clinical Impression  1. Community acquired pneumonia,  unspecified laterality      ED Assessment/Plan  Reviewed imaging independently.  Bilateral nodular opacities and bronchial wall thickening, likely infectious, recommends CT.  See radiology report for full details.  Presentation consistent with a pneumonia. Suspect chest pain is from coughing versus pleural irritation from pneumonia.  Feel that she is very low risk for ACS.  Her chest pain is reproducible, she has no other cardiac risk factors other than smoking, she states this chest pain feels identical to previous chest pain when she had pneumonia.  Deferring EKG today.  Will send home with regularly scheduled albuterol with a spacer, Promethazine DM, Flonase, saline nasal irrigation, Tylenol/ibuprofen, amoxacillin 1 g 3 times daily for 5 days for presumed pneumonia.  She will need to follow-up with her primary care provider in a week or 2 for repeat chest x-ray plus or minus CT.  Work note for 2 days.  Discussed imaging, MDM, treatment plan, and plan for follow-up with patient.  patient agrees with plan.   Meds ordered this encounter  Medications   amoxicillin (AMOXIL) 500 MG tablet    Sig: Take 2 tablets (1,000 mg total) by mouth 3 (three) times daily for 5 days.    Dispense:  30 tablet    Refill:  0   fluticasone (FLONASE) 50 MCG/ACT nasal spray    Sig: Place 2 sprays into both nostrils daily.    Dispense:  16 g    Refill:  0   ibuprofen (ADVIL) 600 MG tablet    Sig: Take 1 tablet (600 mg total) by mouth every 6 (six) hours as needed.    Dispense:  30 tablet    Refill:  0   albuterol (VENTOLIN HFA) 108 (90 Base) MCG/ACT inhaler    Sig: Inhale 1-2 puffs into the lungs every 4 (four) hours as needed for wheezing or shortness of breath.    Dispense:  1 each    Refill:  0   Spacer/Aero-Holding Chambers (AEROCHAMBER PLUS) inhaler    Sig: Use with inhaler    Dispense:  1 each    Refill:  2    Please educate patient on use   promethazine-dextromethorphan (PROMETHAZINE-DM) 6.25-15  MG/5ML syrup    Sig: Take 5 mLs by mouth 4 (four) times daily as needed for cough.    Dispense:  118 mL    Refill:  0      *This clinic note was created using [employer]. Therefore, there may be occasional mistakes despite careful proofreading.  ?    Melynda Ripple, MD 02/15/21 [phone]

## 2021-02-14 NOTE — Discharge Instructions (Addendum)
2 puffs from your albuterol inhaler using your spacer every 4 hours for 2 days, then every 6 hours for 2 days, then as needed.  You may back off the albuterol if you start to feel better.  Promethazine DM for cough.  Flonase, saline nasal irrigation with a [facility] Med rinse and distilled water as often as you want.  Finish the antibiotics, even if you feel better.  Take 600 mg of ibuprofen combined with 1000 mg of Tylenol together 3-4 times a day as needed for body aches, headaches, etc.  Follow-up with your doctor for repeat chest x-ray, plus or minus CT.

## 2021-02-14 NOTE — ED Triage Notes (Signed)
Cough x 3 weeks.  States chest started to hurt 2 days ago and hurts worse when she coughs.  Feels SOB.  States she's worried she may have pneumonia.

## 2021-03-03 ENCOUNTER — Emergency Department
Admission: EM | Admit: 2021-03-03 | Discharge: 2021-03-03 | Disposition: A | Discharge status: Home / Self Care | Payer: Medicaid Other | Attending: Emergency Medicine | Admitting: Emergency Medicine

## 2021-03-03 ENCOUNTER — Emergency Department: Payer: Medicaid Other

## 2021-03-03 ENCOUNTER — Other Ambulatory Visit: Payer: Self-pay

## 2021-03-03 DIAGNOSIS — M7918 Myalgia, other site: Secondary | ICD-10-CM

## 2021-03-03 DIAGNOSIS — M25511 Pain in right shoulder: Secondary | ICD-10-CM | POA: Insufficient documentation

## 2021-03-03 DIAGNOSIS — Y92410 Unspecified street and highway as the place of occurrence of the external cause: Secondary | ICD-10-CM | POA: Insufficient documentation

## 2021-03-03 MED ORDER — CYCLOBENZAPRINE HCL 5 MG PO TABS: 5.0000 mg | ORAL_TABLET | Freq: Three times a day (TID) | ORAL | 0 refills | Status: DC | PRN

## 2021-03-03 MED ORDER — CYCLOBENZAPRINE HCL 10 MG PO TABS
5.0000 mg | ORAL_TABLET | Freq: Once | ORAL | Status: AC
  Administered 2021-03-03: 5 mg via ORAL
  Filled 2021-03-03: qty 1

## 2021-03-03 MED ORDER — NAPROXEN 500 MG PO TABS
500.0000 mg | ORAL_TABLET | Freq: Two times a day (BID) | ORAL | 0 refills | Status: AC
Start: 2021-03-03 — End: 2021-03-18

## 2021-03-03 MED ORDER — ACETAMINOPHEN 325 MG PO TABS
650.0000 mg | ORAL_TABLET | Freq: Once | ORAL | Status: AC
  Administered 2021-03-03: 650 mg via ORAL
  Filled 2021-03-03: qty 2
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
## 2021-03-10 ENCOUNTER — Telehealth: Payer: Medicaid Other | Admitting: Physician Assistant

## 2021-03-10 NOTE — Progress Notes (Signed)
The patient no-showed for appointment despite this provider sending direct link, reaching out via phone with no response and waiting for at least 10 minutes from appointment time for patient to join. They will be marked as a NS for this appointment/time.  ? ?Savita Runner Cody Agapita Savarino, PA-C ? ? ? ?

## 2021-03-13 ENCOUNTER — Encounter (HOSPITAL_COMMUNITY): Payer: Self-pay | Admitting: Radiology

## 2021-05-20 ENCOUNTER — Emergency Department (HOSPITAL_COMMUNITY)
Admission: EM | Admit: 2021-05-20 | Discharge: 2021-05-21 | Disposition: A | Discharge status: Home / Self Care | Payer: Medicaid Other | Attending: Emergency Medicine | Admitting: Emergency Medicine

## 2021-05-20 ENCOUNTER — Other Ambulatory Visit: Payer: Self-pay

## 2021-05-20 DIAGNOSIS — M069 Rheumatoid arthritis, unspecified: Secondary | ICD-10-CM

## 2021-05-20 DIAGNOSIS — M0689 Other specified rheumatoid arthritis, multiple sites: Secondary | ICD-10-CM | POA: Diagnosis not present

## 2021-05-20 DIAGNOSIS — M79642 Pain in left hand: Secondary | ICD-10-CM | POA: Diagnosis present

## 2021-05-20 NOTE — ED Triage Notes (Signed)
Pt presents with new knots of right ankle and left pointer finger. Pt has history RA. Pt complaining of extreme pain ?

## 2021-05-21 MED ORDER — PREDNISONE 50 MG PO TABS
60.0000 mg | ORAL_TABLET | Freq: Once | ORAL | Status: DC
  Filled 2021-05-21: qty 1

## 2021-05-21 MED ORDER — OXYCODONE-ACETAMINOPHEN 5-325 MG PO TABS
1.0000 | ORAL_TABLET | Freq: Once | ORAL | Status: DC
  Filled 2021-05-21: qty 1

## 2021-05-21 MED ORDER — OXYCODONE-ACETAMINOPHEN 5-325 MG PO TABS: 1.0000 | ORAL_TABLET | ORAL | 0 refills | Status: DC | PRN

## 2021-05-21 MED ORDER — PREDNISONE 10 MG PO TABS: ORAL_TABLET | ORAL | 0 refills | Status: DC

## 2021-05-21 NOTE — ED Provider Notes (Signed)
?[facility] EMER[facility]ENC[facility] DEPARTMENT ?Provider Note ? ? ?CSN: [id number] ?Arrival date & time: 05/20/21  2220 ? ?  ? ?History ? ?No chief complaint on file. ? ? ?Linda Calderon is a 46 y.o. female. ? ?The history is provided by the patient.  ?She has history of rheumatoid arthritis, attention deficit disorder and comes in because of painful nodules in her hands and feet and ankles.  She has noticed these for the last month and did see her primary care provider who put her on a 5-day course of prednisone which gave temporary relief, but the nodules have come back. ?  ?Home Medications ?Prior to Admission medications   ?Medication Sig Start Date End Date Taking? Authorizing Provider  ?albuterol (VENTOLIN HFA) 108 (90 Base) MCG/ACT inhaler Inhale 1-2 puffs into the lungs every 4 (four) hours as needed for wheezing or shortness of breath. 02/14/21   Mortenson, Ashley, MD  ?ALPRAZolam (XANAX) 1 MG tablet Take 1 mg by mouth 2 (two) times daily.    [provider]  ?amphetamine-dextroamphetamine (ADDERALL) 20 MG tablet Take 20 mg by mouth daily.    [provider]  ?Calcium Carb-Cholecalciferol (CALCIUM-VITAMIN D) 600-400 MG-UNIT TABS Take 1 tablet by mouth daily.     [provider]  ?cyclobenzaprine (FLEXERIL) 5 MG tablet Take 1 tablet (5 mg total) by mouth 3 (three) times daily as needed. 03/03/21   Menshew, Jenise V Bacon, PA-C  ?fluticasone (FLONASE) 50 MCG/ACT nasal spray Place 2 sprays into both nostrils daily. 02/14/21   Mortenson, Ashley, MD  ?fluticasone furoate-vilanterol (BREO ELLIPTA) 100-25 MCG/INH AEPB Inhale 1 puff into the lungs daily.    [provider]  ?ibuprofen (ADVIL) 600 MG tablet Take 1 tablet (600 mg total) by mouth every 6 (six) hours as needed. 02/14/21   Mortenson, Ashley, MD  ?menthol-cetylpyridinium (CEPACOL REGULAR STRENGTH) 3 MG lozenge Take 1 lozenge (3 mg total) by mouth as needed for sore throat. 09/30/19   Avegno, Komlanvi S, FNP  ?Multiple Vitamins-Minerals  (MULTIVITAMIN WITH MINERALS) tablet Take 1 tablet by mouth daily.    [provider]  ?Nicotine 21-14-7 MG/24HR KIT Use as directed. 08/18/19   Ramaswamy, Murali, MD  ?promethazine-dextromethorphan (PROMETHAZINE-DM) 6.25-15 MG/5ML syrup Take 5 mLs by mouth 4 (four) times daily as needed for cough. 02/14/21   Mortenson, Ashley, MD  ?Spacer/Aero-Holding Chambers (AEROCHAMBER PLUS) inhaler Use with inhaler 02/14/21   Mortenson, Ashley, MD  ?   ? ?Allergies    ?Morphine and related   ? ?Review of Systems   ?Review of Systems  ?All other systems reviewed and are negative. ? ?Physical Exam ?Updated Vital Signs ?BP 121/77 (BP Location: Right Arm)   Pulse (!) 102   Temp 97.9 ?F (36.6 ?C) (Oral)   Resp 18   Ht 5' 4" (1.626 m)   Wt 44.5 kg   LMP 03/09/2011   SpO2 97%   BMI 16.82 kg/m?  ?Physical Exam ?Vitals and nursing note reviewed.  ?46 year old female, resting comfortably and in no acute distress. Vital signs are significant for borderline elevated heart rate. Oxygen saturation is 97%, which is normal. ?Head is normocephalic and atraumatic. PERRLA, EOMI. Oropharynx is clear. ?Neck is nontender and supple without adenopathy or JVD. ?Back is nontender and there is no CVA tenderness. ?Lungs are clear without rales, wheezes, or rhonchi. ?Chest is nontender. ?Heart has regular rate and rhythm without murmur. ?Abdomen is soft, flat, nontender without masses or hepatosplenomegaly and peristalsis is normoactive. ?Extremities: Deformities are noted of   the fingers consistent with rheumatoid arthritis.  Tender, erythematous nodules are noted to for her fingers, wrists, left ankle, both feet consistent with rheumatoid nodules. ?Skin is warm and dry without rash. ?Neurologic: Mental status is normal, cranial nerves are intact, moves all extremities equally. ? ?ED Results / Procedures / Treatments   ? ?Procedures ?Procedures  ? ? ?Medications Ordered in ED ?Medications  ?predniSONE (DELTASONE) tablet 60 mg (has no  administration in time range)  ?oxyCODONE-acetaminophen (PERCOCET/ROXICET) 5-325 MG per tablet 1 tablet (has no administration in time range)  ? ? ?ED Course/ Medical Decision Making/ A&P ?  ?                        ?Medical Decision Making ? ?Flare of rheumatoid arthritis.  Patient is currently on leflunomide, but it is not helping her.  She clearly needs to be on a different immunosuppressive agent.  She is scheduled to see her rheumatologist later this month.  She will be put on prednisone to try to get symptomatic relief in the interim.  She is given a dose of prednisone in the ED and given prescription for 20 mg daily for 5 days, then 10 mg daily.  She is also given a take-home pack of oxycodone-acetaminophen for pain relief. ? ?Final Clinical Impression(s) / ED Diagnoses ?Final diagnoses:  ?Rheumatoid arthritis flare (HCC)  ? ? ?Rx / DC Orders ?ED Discharge Orders   ? ?      Ordered  ?  predniSONE (DELTASONE) 10 MG tablet       ? 05/21/21 0332  ?  oxyCODONE-acetaminophen (PERCOCET) 5-325 MG tablet  Every 4 hours PRN       ? 05/21/21 0332  ? ?  ?  ? ?  ? ? ?  ?Delora Fuel, MD ?65/78/46 [phone] ? ?

## 2021-06-19 ENCOUNTER — Ambulatory Visit (INDEPENDENT_AMBULATORY_CARE_PROVIDER_SITE_OTHER): Payer: Medicaid Other

## 2021-06-19 ENCOUNTER — Ambulatory Visit
Admission: RE | Admit: 2021-06-19 | Discharge: 2021-06-19 | Disposition: A | Discharge status: Home / Self Care | Payer: Medicaid Other | Source: Ambulatory Visit | Attending: Nurse Practitioner | Admitting: Nurse Practitioner

## 2021-06-19 VITALS — BP 129/86 | HR 94 | Temp 97.8°F | Resp 16

## 2021-06-19 DIAGNOSIS — S93601A Unspecified sprain of right foot, initial encounter: Secondary | ICD-10-CM

## 2021-06-19 DIAGNOSIS — M79641 Pain in right hand: Secondary | ICD-10-CM | POA: Diagnosis not present

## 2021-06-19 NOTE — ED Provider Notes (Signed)
[facility]    CSN: [id number] Arrival date & time: 06/19/21  1200      History   Chief Complaint Chief Complaint  Patient presents with   Foot Pain    Stings tingles and is swollen red and hurts - Entered by patient   Appointment    1200    HPI Linda Calderon is a 46 y.o. female.   The history is provided by the patient.   Patient presents with pain in the right foot.  Patient states she twisted the right foot 3 days ago.  She states that she has been able to be off the foot for the last day, but states that she had to go back to work today and has pain with ambulation and weightbearing.  She states that the pain is under the big toe of the right foot.  She also complains of tingling and swelling.  She has taken ibuprofen for the pain.  Denies any previous injury or trauma, or radiation of pain into the ankle. 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 Authorizing Provider  albuterol (VENTOLIN HFA) 108 (90 Base) MCG/ACT inhaler Inhale 1-2 puffs into the lungs every 4 (four) hours as needed for wheezing or shortness of breath. 02/14/21   Melynda Ripple, MD  ALPRAZolam Duanne Moron) 1 MG tablet Take 1 mg by mouth 2 (two) times daily.    [provider]  amphetamine-dextroamphetamine (ADDERALL) 20 MG tablet Take 20 mg by mouth daily.    [provider]  Calcium Carb-Cholecalciferol (CALCIUM-VITAMIN D) 600-400 MG-UNIT TABS Take 1 tablet by mouth daily.     [provider]  cyclobenzaprine (FLEXERIL) 5 MG tablet Take 1 tablet (5 mg total) by mouth 3 (three) times daily as needed. 03/03/21   Menshew, Dannielle Karvonen, PA-C  fluticasone (FLONASE) 50 MCG/ACT nasal spray Place 2 sprays into both nostrils daily. 02/14/21   Melynda Ripple, MD  fluticasone furoate-vilanterol (BREO ELLIPTA) 100-25 MCG/INH AEPB Inhale 1 puff into the lungs daily.    [provider]  ibuprofen (ADVIL) 600 MG tablet Take 1 tablet (600 mg total) by mouth every 6 (six) hours as needed. 02/14/21   Melynda Ripple, MD  menthol-cetylpyridinium (CEPACOL REGULAR STRENGTH) 3 MG lozenge Take 1 lozenge (3 mg total) by mouth as needed for sore throat. 09/30/19   Avegno, Darrelyn Hillock, FNP  Multiple Vitamins-Minerals (MULTIVITAMIN WITH MINERALS) tablet Take 1 tablet by mouth daily.    [provider]  Nicotine 21-14-7 MG/24HR KIT Use as directed. 08/18/19   Brand Males, MD  oxyCODONE-acetaminophen (PERCOCET) 5-325 MG tablet Take 1 tablet by mouth every 4 (four) hours as needed for moderate pain. 07/20/22   Delora Fuel, MD  predniSONE (DELTASONE) 10 MG tablet Take two tablets a day for five days, then one tablet a day 05/27/07   Delora Fuel, MD  promethazine-dextromethorphan (PROMETHAZINE-DM) 6.25-15 MG/5ML syrup Take 5 mLs  by mouth 4 (four) times daily as needed for cough. 02/14/21   Melynda Ripple, MD  Spacer/Aero-Holding Chambers (AEROCHAMBER PLUS) inhaler Use with inhaler 02/14/21   Melynda Ripple, MD    Family History Family History  Problem Relation Age of Onset   Hypertension Mother    Hypertension Father    Cancer Father     Social History Social History   Tobacco Use   Smoking status: Every Day    Packs/day: 0.50    Years: 10.00    Pack years: 5.00    Types: Cigarettes   Smokeless tobacco: Never  Vaping Use   Vaping Use: Never used  Substance Use Topics   Alcohol use: No   Drug use: Yes    Types: Marijuana    Comment: last use 06/01/20     Allergies   Morphine and related   Review of Systems Review of Systems Per HPI  Physical Exam Triage Vital Signs ED Triage Vitals  Enc Vitals Group     BP 06/19/21 1219 129/86     Pulse Rate 06/19/21 1219 94     Resp 06/19/21 1219 16     Temp 06/19/21 1219 97.8 F (36.6 C)     Temp Source 06/19/21 1219 Temporal     SpO2 06/19/21 1219 97 %     Weight --      Height --      Head Circumference --      Peak Flow --      Pain Score 06/19/21 1221 7     Pain Loc --      Pain Edu? --      Excl. in [place]? --    No data found.  Updated Vital Signs BP 129/86 (BP Location: Right Arm)   Pulse 94   Temp 97.8 F (36.6 C) (Temporal)   Resp 16   LMP 03/09/2011   SpO2 97%   Visual Acuity Right Eye Distance:   Left Eye Distance:   Bilateral Distance:    Right Eye Near:   Left Eye Near:    Bilateral Near:     Physical Exam Vitals and nursing note reviewed.  HENT:     Head: Normocephalic.  Eyes:     Pupils: Pupils are equal, round, and reactive to light.  Musculoskeletal:     Cervical back: Normal range of motion.     Right ankle: Normal.     Right foot: Decreased range of motion. Normal capillary refill. Swelling and tenderness present. No deformity. Normal pulse.     Comments: Swelling noted to the first metatarsal of the  right foot with decreased range of  motion and pain.  No bruising or ecchymosis observed.  Skin:    General: Skin is warm and dry.     UC Treatments / Results  Labs (all labs ordered are listed, but only abnormal results are displayed) Labs Reviewed - No data to display  EKG   Radiology DG Foot Complete Right  Result Date: 06/19/2021 CLINICAL DATA:  Pain and swelling and first metatarsophalangeal joint after rolling foot 3 days ago. EXAM: RIGHT FOOT COMPLETE - 3+ VIEW COMPARISON:  Right foot radiographs 07/01/2019 FINDINGS: Minimal lateral great toe metatarsophalangeal joint peripheral degenerative spurring, unchanged. Mild soft tissue swelling around the great toe metatarsophalangeal joint. Joint spaces are maintained. No acute fracture is seen. No dislocation. The cortices are intact. IMPRESSION: 1. Mild soft tissue swelling around the great toe metatarsophalangeal joint. No acute fracture. 2. Minimal degenerative spurring of the great toe metatarsophalangeal joint, unchanged. Electronically Signed   By: Yvonne Kendall M.D.   On: 06/19/2021 12:59    Procedures Procedures (including critical care time)  Medications Ordered in UC Medications - No data to display  Initial Impression / Assessment and Plan / UC Course  I have reviewed the triage vital signs and the nursing notes.  Pertinent labs & imaging results that were available during my care of the patient were reviewed by me and considered in my medical decision making (see chart for details).  Patient presents with pain in the right foot after she twisted it approximately 3 days ago.  Pain is located to the first metatarsal.  She also has swelling to that area.  X-rays confirm there is no fracture or dislocation.  Symptoms are consistent with a sprain at this time.  Patient was provided Ace wrap and postop shoe for her symptoms.  Supportive care recommendations were provided to the patient.  Patient advised to continue  over-the-counter medication to help with pain and inflammation.  Patient was advised if symptoms do not improve within the next 2 to 4 weeks, recommend following up with [facility] [place].  Work note was also provided. Final Clinical Impressions(s) / UC Diagnoses   Final diagnoses:  Sprain of right foot, initial encounter     Discharge Instructions      Your x-ray is negative for fracture or dislocation. Wear the postop shoe until your symptoms improve. RICE therapy, rest, ice, compression, and elevation.  Apply ice for 20 minutes, remove for 1 hour, then repeat. Continue ibuprofen as needed for pain and inflammation. If your symptoms do not improve within the next 2 to 4 weeks, please follow-up with [facility] [place].  You can reach them at [phone]. Follow-up as needed.     ED Prescriptions   None    PDMP not reviewed this encounter.   Tish Men, NP 06/19/21 1320

## 2021-06-19 NOTE — ED Triage Notes (Signed)
Pt reports pain, swelling and tingling in the right foot after she twisted 3 days ago. Ibuprofen gives no relief.

## 2021-06-19 NOTE — Discharge Instructions (Signed)
Your x-ray is negative for fracture or dislocation. Wear the postop shoe until your symptoms improve. RICE therapy, rest, ice, compression, and elevation.  Apply ice for 20 minutes, remove for 1 hour, then repeat. Continue ibuprofen as needed for pain and inflammation. If your symptoms do not improve within the next 2 to 4 weeks, please follow-up with [facility] [place].  You can reach them at [phone]. Follow-up as needed.

## 2021-08-04 ENCOUNTER — Ambulatory Visit
Admission: EM | Admit: 2021-08-04 | Discharge: 2021-08-04 | Disposition: A | Discharge status: Home / Self Care | Payer: Medicaid Other | Attending: Nurse Practitioner | Admitting: Nurse Practitioner

## 2021-08-04 DIAGNOSIS — M069 Rheumatoid arthritis, unspecified: Secondary | ICD-10-CM | POA: Diagnosis not present

## 2021-08-04 MED ORDER — PREDNISONE 10 MG (21) PO TBPK: ORAL_TABLET | Freq: Every day | ORAL | 0 refills | Status: AC

## 2021-08-04 NOTE — Discharge Instructions (Signed)
Take medication as prescribed. Increase fluids and allow for plenty of rest. As discussed, you will need to follow-up with your rheumatologist to discuss patient options, dosing, and current symptoms. You may also follow-up with your PCP if symptoms do not improve.

## 2021-08-04 NOTE — ED Provider Notes (Signed)
[facility]    CSN: [id number] Arrival date & time: 08/04/21  1333      History   Chief Complaint Chief Complaint  Patient presents with   Joint Pain   Joint Swelling    HPI Linda Calderon is a 46 y.o. female.   HPI  Patient presents for complaints of joint swelling in her hands and feet.  Symptoms have been present for the past 3 days.  Patient has a known history of rheumatoid arthritis.  She is under the care of rheumatologist , Dr. Veneta Penton, with [facility].  Patient states she is experienced increased swelling in her hands, and feet.  States that she was unable to work over the last several days due to the pain in her feet.  She also notices increased "knots" in her hands and on her ankles and feet.  Patient states that her Arava dose was recently doubled.  She states she has not noticed any improvement of her symptoms.  She states "my rheumatologist only wants to see me every 4 months".  She has not taken any other medication for her symptoms.  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 Authorizing Provider  predniSONE (STERAPRED UNI-PAK 21 TAB) 10 MG (21) TBPK tablet Take by mouth daily for 6 days. Take 6 tablets with breakfast on day 1. Take 5 tablets with breakfast on day 2. Take 4 tablets with breakfast on day 3. Take 3 tablets with breakfast on day 4. Take 2 tablets with breakfast on day 5. Take 1 tablet with breakfast on day 6. 08/04/21 08/10/21 Yes Jaimya Feliciano-Warren, Alda Lea, NP  albuterol (VENTOLIN HFA) 108 (90 Base) MCG/ACT inhaler Inhale 1-2 puffs into the lungs every 4 (four) hours as needed for wheezing or shortness of breath. 02/14/21   Melynda Ripple, MD  ALPRAZolam Duanne Moron) 1 MG tablet Take 1 mg by mouth 2 (two) times daily.    [provider]  amphetamine-dextroamphetamine (ADDERALL) 20 MG tablet Take 20 mg by mouth daily.    [provider]  Calcium Carb-Cholecalciferol (CALCIUM-VITAMIN D) 600-400 MG-UNIT TABS Take 1 tablet by mouth daily.     [provider]  cyclobenzaprine (FLEXERIL) 5 MG tablet Take 1 tablet (5 mg total) by mouth 3 (three) times daily as needed. 03/03/21   Menshew, Dannielle Karvonen, PA-C  fluticasone (FLONASE) 50 MCG/ACT nasal spray Place 2 sprays into both nostrils daily. 02/14/21   Melynda Ripple, MD  fluticasone furoate-vilanterol (BREO ELLIPTA) 100-25 MCG/INH AEPB Inhale 1 puff into the lungs daily.    [provider]  ibuprofen (ADVIL) 600 MG tablet Take 1 tablet (600 mg total) by mouth every 6 (six) hours as needed. 02/14/21   Melynda Ripple, MD  menthol-cetylpyridinium (CEPACOL REGULAR STRENGTH) 3 MG lozenge Take 1 lozenge (3 mg total) by mouth as needed for sore throat. 09/30/19   Avegno, Darrelyn Hillock, FNP  Multiple Vitamins-Minerals  (MULTIVITAMIN WITH MINERALS) tablet Take 1 tablet by mouth daily.    [provider]  Nicotine 21-14-7 MG/24HR KIT Use as directed. 08/18/19   Brand Males, MD  oxyCODONE-acetaminophen (PERCOCET) 5-325 MG tablet Take 1 tablet by mouth every 4 (four) hours as needed for moderate pain. 05/28/14   Delora Fuel, MD  promethazine-dextromethorphan (PROMETHAZINE-DM) 6.25-15 MG/5ML syrup Take 5 mLs by mouth 4 (four) times daily as needed for cough. 02/14/21   Melynda Ripple, MD  Spacer/Aero-Holding Chambers (AEROCHAMBER PLUS) inhaler Use with inhaler 02/14/21   Melynda Ripple, MD    Family History Family History  Problem Relation Age of Onset   Hypertension Mother    Hypertension Father    Cancer Father     Social History Social History   Tobacco Use   Smoking status: Every Day    Packs/day: 0.50    Years: 10.00    Total pack years: 5.00    Types: Cigarettes   Smokeless tobacco: Never  Vaping Use   Vaping Use: Never used  Substance Use Topics   Alcohol use: No   Drug use: Yes    Types: Marijuana    Comment: last use 06/01/20     Allergies   Morphine and related   Review of Systems Review of Systems Per HPI  Physical Exam Triage Vital Signs ED Triage Vitals  Enc Vitals Group     BP 08/04/21 1357 107/78     Pulse Rate 08/04/21 1357 92     Resp 08/04/21 1357 18     Temp 08/04/21 1357 97.8 F (36.6 C)     Temp src --      SpO2 08/04/21 1357 93 %     Weight --      Height --      Head Circumference --      Peak Flow --      Pain Score 08/04/21 1356 8     Pain Loc --      Pain Edu? --      Excl. in [place]? --    No data found.  Updated Vital Signs BP 107/78   Pulse 92   Temp 97.8 F (36.6 C)   Resp 18   LMP 03/09/2011   SpO2 93% Comment: denies sob  Visual Acuity Right Eye Distance:   Left Eye Distance:   Bilateral Distance:    Right Eye Near:   Left Eye Near:    Bilateral Near:     Physical Exam Vitals and nursing note reviewed.   Constitutional:      Appearance: Normal appearance.  HENT:     Head: Normocephalic.  Cardiovascular:  Rate and Rhythm: Normal rate and regular rhythm.     Pulses: Normal pulses.     Heart sounds: Normal heart sounds.  Pulmonary:     Effort: Pulmonary effort is normal.     Breath sounds: Normal breath sounds.  Abdominal:     General: Bowel sounds are normal.     Palpations: Abdomen is soft.     Tenderness: There is no abdominal tenderness.  Musculoskeletal:     Right hand: Swelling, deformity and tenderness present. Decreased range of motion. Normal capillary refill. Normal pulse.     Left hand: Swelling, deformity and tenderness present. Decreased range of motion. Normal capillary refill. Normal pulse.     Comments: Erythema, swelling noted to the metacarpohalangeal joints of both hands.  Deformity noted to the index and middle fingers of the right hand.  Tender, erythematous nodules are noted to for her wrists, left ankle, and feet.  Skin:    General: Skin is warm and dry.  Neurological:     General: No focal deficit present.     Mental Status: She is alert and oriented to person, place, and time.  Psychiatric:        Mood and Affect: Mood normal.        Behavior: Behavior normal.      UC Treatments / Results  Labs (all labs ordered are listed, but only abnormal results are displayed) Labs Reviewed - No data to display  EKG   Radiology No results found.  Procedures Procedures (including critical care time)  Medications Ordered in UC Medications - No data to display  Initial Impression / Assessment and Plan / UC Course  I have reviewed the triage vital signs and the nursing notes.  Pertinent labs & imaging results that were available during my care of the patient were reviewed by me and considered in my medical decision making (see chart for details).  Patient presents with joint pain and swelling has been present for the past 3 days.  On exam, patient has  rheumatoid nodules noted to her hands, wrists, feet, and ankles.  Symptoms are consistent with rheumatoid arthritis flare.  Will start patient on prednisone to help with her symptoms temporarily.  Patient was advised to follow-up with her rheumatologist or primary care physician to discuss other treatment options if the Jolee Ewing is not helping her symptoms.  Patient was provided work note for 3 days.  Follow-up as needed. Final Clinical Impressions(s) / UC Diagnoses   Final diagnoses:  Rheumatoid arthritis flare ([place])     Discharge Instructions      Take medication as prescribed. Increase fluids and allow for plenty of rest. As discussed, you will need to follow-up with your rheumatologist to discuss patient options, dosing, and current symptoms. You may also follow-up with your PCP if symptoms do not improve.      ED Prescriptions     Medication Sig Dispense Auth. Provider   predniSONE (STERAPRED UNI-PAK 21 TAB) 10 MG (21) TBPK tablet Take by mouth daily for 6 days. Take 6 tablets with breakfast on day 1. Take 5 tablets with breakfast on day 2. Take 4 tablets with breakfast on day 3. Take 3 tablets with breakfast on day 4. Take 2 tablets with breakfast on day 5. Take 1 tablet with breakfast on day 6. 21 tablet Loris Winrow-Warren, Alda Lea, NP      PDMP not reviewed this encounter.   Tish Men, NP 08/04/21 1429

## 2021-08-04 NOTE — ED Triage Notes (Signed)
Pt presents with joint pain and swelling since Saturday night. Pt has RA but states this feels different

## 2021-09-06 ENCOUNTER — Ambulatory Visit
Admission: RE | Admit: 2021-09-06 | Discharge: 2021-09-06 | Disposition: A | Discharge status: Home / Self Care | Payer: Medicaid Other | Source: Ambulatory Visit

## 2021-09-06 VITALS — BP 109/79 | HR 73 | Temp 97.8°F | Resp 16

## 2021-09-06 DIAGNOSIS — M069 Rheumatoid arthritis, unspecified: Secondary | ICD-10-CM | POA: Diagnosis not present

## 2021-09-06 MED ORDER — DEXAMETHASONE SODIUM PHOSPHATE 10 MG/ML IJ SOLN
10.0000 mg | INTRAMUSCULAR | Status: AC
Start: 2021-09-06 — End: 2021-09-06
  Administered 2021-09-06: 10 mg via INTRAMUSCULAR

## 2021-09-06 MED ORDER — PREDNISONE 50 MG PO TABS: ORAL_TABLET | ORAL | 0 refills | Status: DC

## 2021-09-06 NOTE — Discharge Instructions (Addendum)
  Take medication as prescribed. Increase fluids and allow for plenty of rest. As discussed, follow-up with your rheumatologist as scheduled. You may also follow-up with your PCP if symptoms do not improve. Follow-up as needed.

## 2021-09-06 NOTE — ED Provider Notes (Signed)
[facility]    CSN: [id number] Arrival date & time: 09/06/21  1220      History   Chief Complaint Chief Complaint  Patient presents with   Hand Problem    Rheumatoid arthritis flare up in hands and wrist - Entered by patient    HPI Linda Calderon is a 46 y.o. female.   The history is provided by the patient.   Patient presents for complaints of joint swelling in her bilateral hands.  Symptoms have been present for the past 3 days.  Patient has a known history of rheumatoid arthritis.  She is currently scheduled to start seeing [facility] rheumatology in September.  Patient reports swelling is greater in the right hand versus the left.  She complains of pain, tenderness, decreased grip strength.  She has been taking Tylenol for her symptoms.  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 ([place])    Pulmonary infiltrates 07/31/2019   Bronchiolitis 07/31/2019   DDD (degenerative disc disease), lumbar 07/31/2019   PTSD (post-traumatic stress disorder) 07/31/2019   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 Authorizing Provider  acetaminophen (TYLENOL 8 HOUR ARTHRITIS PAIN) 650 MG CR tablet Take 650 mg by mouth every 8 (eight) hours as needed for pain.   Yes [provider]  predniSONE (DELTASONE) 50 MG tablet Take 1 tablet daily with breakfast for 5 days. 09/06/21  Yes Haidynn Almendarez-Warren, Alda Lea, NP  albuterol (VENTOLIN HFA) 108 (90 Base) MCG/ACT inhaler Inhale 1-2 puffs into the lungs every 4 (four) hours as needed for wheezing or shortness of breath. 02/14/21   Melynda Ripple, MD  ALPRAZolam Duanne Moron) 1 MG tablet Take 1 mg by mouth 2 (two) times daily.    [provider]  amphetamine-dextroamphetamine (ADDERALL) 20 MG tablet Take 20 mg by mouth daily.    [provider]  Calcium Carb-Cholecalciferol (CALCIUM-VITAMIN D) 600-400 MG-UNIT TABS Take 1 tablet by mouth daily.     [provider]  cyclobenzaprine (FLEXERIL) 5 MG tablet Take 1 tablet (5 mg total) by mouth 3 (three) times daily as needed. 03/03/21   Menshew, Dannielle Karvonen,  PA-C  fluticasone (FLONASE) 50 MCG/ACT nasal spray Place 2 sprays into both nostrils daily. 02/14/21   Melynda Ripple, MD  fluticasone furoate-vilanterol (BREO ELLIPTA) 100-25 MCG/INH AEPB Inhale 1 puff into the lungs daily.    [provider]  ibuprofen (ADVIL) 600 MG tablet Take 1 tablet (600 mg total) by mouth every 6 (six) hours as needed. 02/14/21   Melynda Ripple, MD  menthol-cetylpyridinium (CEPACOL REGULAR STRENGTH) 3 MG lozenge Take 1 lozenge (3 mg total) by mouth as needed for sore throat. 09/30/19   Avegno, Darrelyn Hillock, FNP  Multiple Vitamins-Minerals (MULTIVITAMIN WITH MINERALS) tablet Take 1 tablet by mouth daily.    [provider]  Nicotine 21-14-7 MG/24HR KIT Use as directed. 08/18/19   Brand Males, MD  oxyCODONE-acetaminophen (PERCOCET)  5-325 MG tablet Take 1 tablet by mouth every 4 (four) hours as needed for moderate pain. 07/20/14   Delora Fuel, MD  promethazine-dextromethorphan (PROMETHAZINE-DM) 6.25-15 MG/5ML syrup Take 5 mLs by mouth 4 (four) times daily as needed for cough. 02/14/21   Melynda Ripple, MD  Spacer/Aero-Holding Chambers (AEROCHAMBER PLUS) inhaler Use with inhaler 02/14/21   Melynda Ripple, MD    Family History Family History  Problem Relation Age of Onset   Hypertension Mother    Hypertension Father    Cancer Father     Social History Social History   Tobacco Use   Smoking status: Every Day    Packs/day: 0.50    Years: 10.00    Total pack years: 5.00    Types: Cigarettes   Smokeless tobacco: Never  Vaping Use   Vaping Use: Never used  Substance Use Topics   Alcohol use: No   Drug use: Yes    Types: Marijuana    Comment: last use 06/01/20     Allergies   Morphine and related   Review of Systems Review of Systems Per HPI  Physical Exam Triage Vital Signs ED Triage Vitals  Enc Vitals Group     BP 09/06/21 1232 109/79     Pulse Rate 09/06/21 1232 73     Resp 09/06/21 1232 16     Temp 09/06/21 1232 97.8 F (36.6 C)     Temp Source 09/06/21 1232 Oral     SpO2 09/06/21 1232 93 %     Weight --      Height --      Head Circumference --      Peak Flow --      Pain Score 09/06/21 1233 7     Pain Loc --      Pain Edu? --      Excl. in [place]? --    No data found.  Updated Vital Signs BP 109/79 (BP Location: Right Arm)   Pulse 73   Temp 97.8 F (36.6 C) (Oral)   Resp 16   LMP 03/09/2011   SpO2 93%   Visual Acuity Right Eye Distance:   Left Eye Distance:   Bilateral Distance:    Right Eye Near:   Left Eye Near:    Bilateral Near:     Physical Exam Physical Exam Vitals and nursing note reviewed.  Constitutional:      Appearance: Normal appearance.  HENT:     Head: Normocephalic.  Cardiovascular:     Rate and Rhythm: Normal rate and regular rhythm.      Pulses: Normal pulses.     Heart sounds: Normal heart sounds.  Pulmonary:     Effort: Pulmonary effort is normal.  Breath sounds: Normal breath sounds.  Abdominal:     General: Bowel sounds are normal.     Palpations: Abdomen is soft.     Tenderness: There is no abdominal tenderness.  Musculoskeletal:     Right hand: Swelling, deformity and tenderness present. Decreased range of motion. Normal capillary refill. Normal pulse.     Left hand: Swelling, deformity and tenderness present. Decreased range of motion. Normal capillary refill. Normal pulse.     Comments: Erythema, swelling noted to the metacarpohalangeal joints of both hands.  Deformity noted to the index and middle fingers of the right hand. Tender, erythematous nodules are noted the bilateral hands. Skin:    General: Skin is warm and dry.  Neurological:     General: No focal deficit present.     Mental Status: She is alert and oriented to person, place, and time.  Psychiatric:        Mood and Affect: Mood normal.        Behavior: Behavior normal.     UC Treatments / Results  Labs (all labs ordered are listed, but only abnormal results are displayed) Labs Reviewed - No data to display  EKG   Radiology No results found.  Procedures Procedures (including critical care time)  Medications Ordered in UC Medications  dexamethasone (DECADRON) injection 10 mg (10 mg Intramuscular Given 09/06/21 1256)    Initial Impression / Assessment and Plan / UC Course  I have reviewed the triage vital signs and the nursing notes.  Pertinent labs & imaging results that were available during my care of the patient were reviewed by me and considered in my medical decision making (see chart for details).  Patient presents with joint pain and swelling has been present for the past 3 days.  On exam, patient has rheumatoid nodules noted to her hands. Symptoms are consistent with rheumatoid arthritis flare.  Patient was last seen for the  same or similar symptoms in July.  Decadron 10 mg IM injection given in the clinic today.  Will start patient on prednisone to help with her symptoms temporarily.  Patient was advised to follow-up with her rheumatologist or primary care physician as scheduled.  Follow-up as needed. Final Clinical Impressions(s) / UC Diagnoses   Final diagnoses:  Flare of rheumatoid arthritis [facility])     Discharge Instructions         Take medication as prescribed. Increase fluids and allow for plenty of rest. As discussed, follow-up with your rheumatologist as scheduled. You may also follow-up with your PCP if symptoms do not improve. Follow-up as needed.       ED Prescriptions     Medication Sig Dispense Auth. Provider   predniSONE (DELTASONE) 50 MG tablet Take 1 tablet daily with breakfast for 5 days. 5 tablet Tannah Dreyfuss-Warren, Alda Lea, NP      PDMP not reviewed this encounter.   Tish Men, NP 09/06/21 1300

## 2021-09-06 NOTE — ED Triage Notes (Signed)
Pt reports swelling and pain in right hand and right wrist x 3 days due to Rheumatoid arthritis flare up. Tylenol gives no relief.

## 2021-10-02 ENCOUNTER — Ambulatory Visit
Admission: EM | Admit: 2021-10-02 | Discharge: 2021-10-02 | Disposition: A | Discharge status: Home / Self Care | Payer: Medicaid Other | Attending: Family Medicine | Admitting: Family Medicine

## 2021-10-02 ENCOUNTER — Encounter: Payer: Self-pay | Admitting: Emergency Medicine

## 2021-10-02 ENCOUNTER — Other Ambulatory Visit: Payer: Self-pay

## 2021-10-02 DIAGNOSIS — Z20822 Contact with and (suspected) exposure to covid-19: Secondary | ICD-10-CM | POA: Diagnosis not present

## 2021-10-02 DIAGNOSIS — R197 Diarrhea, unspecified: Secondary | ICD-10-CM | POA: Diagnosis present

## 2021-10-02 DIAGNOSIS — J069 Acute upper respiratory infection, unspecified: Secondary | ICD-10-CM | POA: Diagnosis present

## 2021-10-02 DIAGNOSIS — M069 Rheumatoid arthritis, unspecified: Secondary | ICD-10-CM | POA: Diagnosis present

## 2021-10-02 LAB — RESP PANEL BY RT-PCR (FLU A&B, COVID) ARPGX2
Influenza A by PCR: NEGATIVE
Influenza B by PCR: NEGATIVE
SARS Coronavirus 2 by RT PCR: NEGATIVE

## 2021-10-02 MED ORDER — METHYLPREDNISOLONE SODIUM SUCC 125 MG IJ SOLR
80.0000 mg | Freq: Once | INTRAMUSCULAR | Status: AC
  Administered 2021-10-02: 80 mg via INTRAMUSCULAR

## 2021-10-02 NOTE — ED Provider Notes (Signed)
[facility]    CSN: [id number] Arrival date & time: 10/02/21  1418      History   Chief Complaint Chief Complaint  Patient presents with   Generalized Body Aches    Chills, nausea, diarrhea - Entered by patient    HPI Linda Calderon is a 46 y.o. female.   Patient presenting today with 1 day history of generalized weakness, dizziness, diarrhea, nausea, vomiting, cough, sore throat.  Denies chest pain, shortness of breath, abdominal pain, nausea vomiting or diarrhea.  Taking Tylenol Cold and sinus with minimal relief.  Has a Breo inhaler for history of pulmonary infiltrates and inflammation.  Also having a rheumatoid arthritis flare in her hands and requesting a steroid shot as she tends to come in for these her flares with some relief.    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 Authorizing Provider  acetaminophen (TYLENOL 8 HOUR ARTHRITIS PAIN) 650 MG CR tablet Take 650 mg by mouth every 8 (eight) hours as needed for pain.    [provider]  albuterol (VENTOLIN HFA) 108 (90 Base) MCG/ACT inhaler Inhale 1-2 puffs into the lungs every 4 (four) hours as needed for wheezing or shortness of breath. 02/14/21   Melynda Ripple, MD  ALPRAZolam Duanne Moron) 1 MG tablet Take 1 mg by mouth 2 (two) times daily.    [provider]  amphetamine-dextroamphetamine (ADDERALL) 20 MG tablet Take 20 mg by mouth daily.    [provider]  Calcium Carb-Cholecalciferol (CALCIUM-VITAMIN D) 600-400 MG-UNIT TABS Take 1 tablet by mouth daily.     [provider]  cyclobenzaprine (FLEXERIL) 5 MG tablet Take 1 tablet (5 mg total) by mouth 3 (three) times daily as needed. 03/03/21   Menshew, Dannielle Karvonen, PA-C  fluticasone (FLONASE) 50 MCG/ACT nasal spray Place 2 sprays into both nostrils daily. 02/14/21   Melynda Ripple, MD  fluticasone furoate-vilanterol (BREO ELLIPTA) 100-25 MCG/INH AEPB Inhale 1 puff into the lungs daily.    [provider]  ibuprofen (ADVIL) 600 MG tablet Take  1 tablet (600 mg total) by mouth every 6 (six) hours as needed. 02/14/21   Melynda Ripple, MD  menthol-cetylpyridinium (CEPACOL REGULAR STRENGTH) 3 MG lozenge Take 1 lozenge (3 mg total) by mouth as needed for sore throat. 09/30/19   Avegno, Darrelyn Hillock, FNP  Multiple Vitamins-Minerals (MULTIVITAMIN WITH MINERALS) tablet Take 1 tablet by mouth daily.    [provider]  Nicotine 21-14-7 MG/24HR KIT Use as directed. 08/18/19   Brand Males, MD  oxyCODONE-acetaminophen (PERCOCET) 5-325 MG tablet Take 1 tablet by mouth every 4 (four) hours as needed for moderate pain. 03/24/55   Delora Fuel, MD  predniSONE (DELTASONE) 50 MG tablet Take 1 tablet daily with breakfast for 5 days. 09/06/21   Leath-Warren, Alda Lea, NP   promethazine-dextromethorphan (PROMETHAZINE-DM) 6.25-15 MG/5ML syrup Take 5 mLs by mouth 4 (four) times daily as needed for cough. 02/14/21   Melynda Ripple, MD  Spacer/Aero-Holding Chambers (AEROCHAMBER PLUS) inhaler Use with inhaler 02/14/21   Melynda Ripple, MD    Family History Family History  Problem Relation Age of Onset   Hypertension Mother    Hypertension Father    Cancer Father     Social History Social History   Tobacco Use   Smoking status: Every Day    Packs/day: 0.50    Years: 10.00    Total pack years: 5.00    Types: Cigarettes   Smokeless tobacco: Never  Vaping Use   Vaping Use: Never used  Substance Use Topics   Alcohol use: No   Drug use: Yes    Types: Marijuana    Comment: last use 06/01/20     Allergies   Morphine and related   Review of Systems Review of Systems PER HPI  Physical Exam Triage Vital Signs ED Triage Vitals [10/02/21 1458]  Enc Vitals Group     BP (!) 163/105     Pulse Rate 85     Resp 20     Temp 98 F (36.7 C)     Temp Source Oral     SpO2 95 %     Weight      Height      Head Circumference      Peak Flow      Pain Score 5     Pain Loc      Pain Edu?      Excl. in [place]?    No data found.  Updated Vital Signs BP (!) 163/105 Comment: third attempt, two different cuffs. pt complain of headache and denies hx of HTN.  Pulse 85   Temp 98 F (36.7 C) (Oral)   Resp 20   LMP 03/09/2011   SpO2 95%   Visual Acuity Right Eye Distance:   Left Eye Distance:   Bilateral Distance:    Right Eye Near:   Left Eye Near:    Bilateral Near:     Physical Exam Vitals and nursing note reviewed.  Constitutional:      Appearance: Normal appearance. She is not ill-appearing.  HENT:     Head: Atraumatic.     Right Ear: Tympanic membrane and external ear normal.     Left Ear: Tympanic membrane and external ear normal.     Nose: Rhinorrhea present.     Mouth/Throat:     Mouth: Mucous membranes are moist.     Pharynx:  Posterior oropharyngeal erythema present.  Eyes:     Extraocular Movements: Extraocular movements intact.     Conjunctiva/sclera: Conjunctivae normal.  Cardiovascular:  Rate and Rhythm: Normal rate and regular rhythm.     Heart sounds: Normal heart sounds.  Pulmonary:     Effort: Pulmonary effort is normal.     Breath sounds: Normal breath sounds. No wheezing or rales.  Musculoskeletal:        General: Normal range of motion.     Cervical back: Normal range of motion and neck supple.     Comments: Bilateral hand inflammation, deformities in the fingers, secondary to rheumatoid arthritis  Skin:    General: Skin is warm and dry.  Neurological:     Mental Status: She is alert and oriented to person, place, and time.     Motor: No weakness.     Gait: Gait normal.  Psychiatric:        Mood and Affect: Mood normal.        Thought Content: Thought content normal.        Judgment: Judgment normal.    UC Treatments / Results  Labs (all labs ordered are listed, but only abnormal results are displayed) Labs Reviewed  RESP PANEL BY RT-PCR (FLU A&B, COVID) ARPGX2    EKG   Radiology No results found.  Procedures Procedures (including critical care time)  Medications Ordered in UC Medications  methylPREDNISolone sodium succinate (SOLU-MEDROL) 125 mg/2 mL injection 80 mg (has no administration in time range)    Initial Impression / Assessment and Plan / UC Course  I have reviewed the triage vital signs and the nursing notes.  Pertinent labs & imaging results that were available during my care of the patient were reviewed by me and considered in my medical decision making (see chart for details).     Suspect viral upper respiratory infection, respiratory panel pending, treat with Phenergan DM, supportive over-the-counter medications and home care.  Regarding her RA flare, will give IM Solu-Medrol and follow-up with PCP.  Work note given.  Return for worsening  symptoms.  Final Clinical Impressions(s) / UC Diagnoses   Final diagnoses:  Viral URI  Diarrhea, unspecified type  Rheumatoid arthritis flare [facility])   Discharge Instructions   None    ED Prescriptions   None    PDMP not reviewed this encounter.   Volney American, [place] 10/02/21 1528

## 2021-10-02 NOTE — ED Triage Notes (Signed)
Pt reports generalized weakness, dizziness, diarrhea, emesis since last night. Denies any known fevers but reports intermittent chills.

## 2021-10-15 ENCOUNTER — Ambulatory Visit: Payer: Medicaid Other

## 2021-10-21 ENCOUNTER — Ambulatory Visit
Admission: RE | Admit: 2021-10-21 | Discharge: 2021-10-21 | Disposition: A | Discharge status: Home / Self Care | Payer: Medicaid Other | Source: Ambulatory Visit | Attending: Family Medicine | Admitting: Family Medicine

## 2021-10-21 VITALS — BP 159/110 | HR 70 | Temp 97.9°F | Resp 18

## 2021-10-21 DIAGNOSIS — M79642 Pain in left hand: Secondary | ICD-10-CM | POA: Diagnosis not present

## 2021-10-21 DIAGNOSIS — M069 Rheumatoid arthritis, unspecified: Secondary | ICD-10-CM

## 2021-10-21 DIAGNOSIS — M79641 Pain in right hand: Secondary | ICD-10-CM

## 2021-10-21 MED ORDER — METHYLPREDNISOLONE SODIUM SUCC 125 MG IJ SOLR
60.0000 mg | Freq: Once | INTRAMUSCULAR | Status: AC
  Administered 2021-10-21: 60 mg via INTRAMUSCULAR

## 2021-10-21 MED ORDER — PREDNISONE 50 MG PO TABS: ORAL_TABLET | ORAL | 0 refills | Status: DC

## 2021-10-21 NOTE — ED Provider Notes (Signed)
[facility]    CSN: [id number] Arrival date & time: 10/21/21  1149      History   Chief Complaint Chief Complaint  Patient presents with   Hand Problem    Bad RA Flareup in hands and wrists - Entered by patient    HPI Linda Calderon is a 46 y.o. female.   Patient presenting today with 3-day history of pain, swelling, bruising, stiffness to bilateral hands.  Denies any known injury, history of severe uncontrolled rheumatoid arthritis and states she always comes in for flares and gets a steroid shot and prednisone which is the only thing that seems to help.  She states she has been referred to rheumatology in the past but they never followed up with her so she does not have a rheumatologist at this time.  Not on any maintenance medications.    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 Authorizing Provider  acetaminophen (TYLENOL 8 HOUR ARTHRITIS PAIN) 650 MG CR tablet Take 650 mg by mouth every 8 (eight) hours as needed for pain.    [provider]  albuterol (VENTOLIN HFA) 108 (90 Base) MCG/ACT inhaler Inhale 1-2 puffs into the lungs every 4 (four) hours as needed for wheezing or shortness of breath. 02/14/21   Melynda Ripple, MD  ALPRAZolam Duanne Moron) 1 MG tablet Take 1 mg by mouth 2 (two) times daily.    [provider]  amphetamine-dextroamphetamine (ADDERALL) 20 MG tablet Take 20 mg by mouth daily.    [provider]  Calcium Carb-Cholecalciferol (CALCIUM-VITAMIN D) 600-400 MG-UNIT TABS Take 1 tablet by mouth daily.     [provider]  cyclobenzaprine (FLEXERIL) 5 MG tablet Take 1 tablet (5 mg total) by mouth 3 (three) times daily as needed. 03/03/21   Menshew, Dannielle Karvonen, PA-C  fluticasone (FLONASE) 50 MCG/ACT nasal spray Place 2 sprays into both nostrils daily. 02/14/21   Melynda Ripple, MD  fluticasone furoate-vilanterol (BREO ELLIPTA) 100-25 MCG/INH AEPB Inhale 1 puff into the lungs daily.    [provider]  ibuprofen (ADVIL) 600 MG tablet Take 1 tablet (600 mg total) by mouth every 6 (six) hours as needed. 02/14/21   Melynda Ripple, MD  menthol-cetylpyridinium (CEPACOL REGULAR STRENGTH) 3 MG lozenge Take 1 lozenge (3 mg total) by mouth as needed for sore throat. 09/30/19   Avegno, Darrelyn Hillock, FNP  Multiple Vitamins-Minerals (MULTIVITAMIN WITH MINERALS) tablet Take 1 tablet by mouth daily.    [provider]  Nicotine 21-14-7 MG/24HR KIT Use as directed. 08/18/19   Brand Males, MD  oxyCODONE-acetaminophen (PERCOCET) 5-325 MG tablet Take 1 tablet by mouth every 4 (four) hours as needed for moderate pain. 08/20/93   Delora Fuel, MD  predniSONE (DELTASONE) 50 MG tablet Take 1 tablet daily with breakfast for 5 days. 10/21/21   Volney American, PA-C  promethazine-dextromethorphan (PROMETHAZINE-DM) 6.25-15 MG/5ML syrup Take 5 mLs by mouth 4 (four) times daily as needed for cough. 02/14/21   Melynda Ripple, MD  Spacer/Aero-Holding Chambers (AEROCHAMBER PLUS) inhaler Use with inhaler 02/14/21   Melynda Ripple, MD    Family History Family History  Problem Relation Age of Onset   Hypertension Mother    Hypertension Father    Cancer Father     Social History Social History   Tobacco Use   Smoking status: Every Day    Packs/day: 0.50    Years: 10.00    Total pack years: 5.00    Types: Cigarettes   Smokeless tobacco: Never  Vaping Use   Vaping Use: Never used  Substance Use Topics   Alcohol use: No   Drug use: Yes    Types: Marijuana    Comment: last use 06/01/20     Allergies   Morphine and related   Review of Systems Review of Systems Per HPI  Physical Exam Triage Vital Signs ED Triage Vitals  Enc Vitals Group     BP 10/21/21 1402 (!) 159/110     Pulse Rate 10/21/21 1402 70     Resp 10/21/21 1402 18     Temp 10/21/21 1402 97.9 F (36.6 C)     Temp Source 10/21/21 1402 Oral     SpO2 10/21/21 1402 98 %     Weight --      Height --      Head Circumference --      Peak Flow --      Pain Score 10/21/21 1401 8     Pain Loc --      Pain Edu? --      Excl. in [place]? --    No data found.  Updated Vital Signs BP (!) 159/110 (BP Location: Right Arm)   Pulse 70   Temp 97.9 F (36.6 C) (Oral)   Resp 18   LMP 03/09/2011   SpO2 98%   Visual Acuity Right Eye Distance:   Left Eye Distance:   Bilateral Distance:    Right Eye Near:   Left Eye Near:    Bilateral Near:     Physical Exam Vitals and nursing note reviewed.  Constitutional:      Appearance: Normal appearance. She is not ill-appearing.  HENT:     Head: Atraumatic.  Eyes:     Extraocular Movements: Extraocular movements intact.     Conjunctiva/sclera: Conjunctivae normal.  Cardiovascular:     Rate and Rhythm: Normal rate  and regular rhythm.     Heart sounds: Normal heart sounds.  Pulmonary:     Effort: Pulmonary effort is normal.     Breath sounds:  Normal breath sounds.  Musculoskeletal:        General: Swelling, tenderness and deformity present. Normal range of motion.     Cervical back: Normal range of motion and neck supple.     Comments: Chronic deformities to hands from rheumatoid arthritis, diffuse edema, stiffness and painful range of motion on exam  Skin:    General: Skin is warm and dry.  Neurological:     Mental Status: She is alert and oriented to person, place, and time.  Psychiatric:        Mood and Affect: Mood normal.        Thought Content: Thought content normal.        Judgment: Judgment normal.      UC Treatments / Results  Labs (all labs ordered are listed, but only abnormal results are displayed) Labs Reviewed - No data to display  EKG   Radiology No results found.  Procedures Procedures (including critical care time)  Medications Ordered in UC Medications  methylPREDNISolone sodium succinate (SOLU-MEDROL) 125 mg/2 mL injection 60 mg (60 mg Intramuscular Given 10/21/21 1427)    Initial Impression / Assessment and Plan / UC Course  I have reviewed the triage vital signs and the nursing notes.  Pertinent labs & imaging results that were available during my care of the patient were reviewed by me and considered in my medical decision making (see chart for details).     Treat with IM Solu-Medrol, prednisone, close rheumatology follow-up.  Resources given for this and discussed to reach out to the office she was referred to for scheduling if unable to direct schedule at this location.  Final Clinical Impressions(s) / UC Diagnoses   Final diagnoses:  Pain in both hands  Rheumatoid arthritis involving both hands, unspecified whether rheumatoid factor present [facility])   Discharge Instructions   None    ED Prescriptions     Medication Sig Dispense Auth. Provider    predniSONE (DELTASONE) 50 MG tablet Take 1 tablet daily with breakfast for 5 days. 5 tablet Volney American, [place]      PDMP not reviewed this encounter.   Volney American, [place] 10/21/21 1434

## 2021-10-21 NOTE — ED Triage Notes (Signed)
Pt reports pain, swelling and bruising in hands x 3 days. Reports due to arthritis

## 2021-11-04 ENCOUNTER — Other Ambulatory Visit: Payer: Self-pay

## 2021-11-04 ENCOUNTER — Encounter (HOSPITAL_COMMUNITY): Payer: Self-pay

## 2021-11-04 ENCOUNTER — Emergency Department (HOSPITAL_COMMUNITY)
Admission: EM | Admit: 2021-11-04 | Discharge: 2021-11-04 | Disposition: A | Discharge status: Home / Self Care | Payer: Medicaid Other | Attending: Emergency Medicine | Admitting: Emergency Medicine

## 2021-11-04 DIAGNOSIS — S60861A Insect bite (nonvenomous) of right wrist, initial encounter: Secondary | ICD-10-CM | POA: Insufficient documentation

## 2021-11-04 DIAGNOSIS — M79641 Pain in right hand: Secondary | ICD-10-CM | POA: Diagnosis not present

## 2021-11-04 DIAGNOSIS — W57XXXA Bitten or stung by nonvenomous insect and other nonvenomous arthropods, initial encounter: Secondary | ICD-10-CM | POA: Diagnosis not present

## 2021-11-04 DIAGNOSIS — M79642 Pain in left hand: Secondary | ICD-10-CM | POA: Diagnosis not present

## 2021-11-04 MED ORDER — DEXAMETHASONE SODIUM PHOSPHATE 10 MG/ML IJ SOLN
10.0000 mg | Freq: Once | INTRAMUSCULAR | Status: AC
  Administered 2021-11-04: 10 mg via INTRAMUSCULAR
  Filled 2021-11-04: qty 1

## 2021-11-04 MED ORDER — CEPHALEXIN 500 MG PO CAPS: 500.0000 mg | ORAL_CAPSULE | Freq: Three times a day (TID) | ORAL | 0 refills | Status: DC

## 2021-11-04 MED ORDER — TRIAMCINOLONE ACETONIDE 0.1 % EX CREA: 1.0000 | TOPICAL_CREAM | Freq: Two times a day (BID) | CUTANEOUS | 0 refills | Status: DC

## 2021-11-04 MED ORDER — CEPHALEXIN 500 MG PO CAPS
500.0000 mg | ORAL_CAPSULE | Freq: Once | ORAL | Status: AC
  Administered 2021-11-04: 500 mg via ORAL
  Filled 2021-11-04: qty 1

## 2021-11-04 MED ORDER — OXYCODONE-ACETAMINOPHEN 5-325 MG PO TABS
1.0000 | ORAL_TABLET | Freq: Once | ORAL | Status: AC
  Administered 2021-11-04: 1 via ORAL
  Filled 2021-11-04: qty 1

## 2021-11-04 NOTE — Discharge Instructions (Signed)
You have received a steroid injection today for your RA.  I recommend that you take the antibiotic as directed until finished.  I have also prescribed a steroid cream to apply to the affected area.  You may take over-the-counter Benadryl 1 capsule every 4-6 hours if needed for itching.  Follow-up with your primary care provider for recheck.

## 2021-11-04 NOTE — ED Provider Notes (Signed)
[facility] EMERGENCY DEPARTMENT Provider Note   CSN: [id number] Arrival date & time: 11/04/21  1022       History  Chief Complaint  Patient presents with   Hand Pain   Wrist Pain    Linda Calderon is a 46 y.o. female.   Hand Pain Pertinent negatives include no chest pain, no headaches and no shortness of breath.  Wrist Pain Pertinent negatives include no chest pain, no headaches and no shortness of breath.        Linda Calderon is a 46 y.o. female with past medical history history of rheumatoid arthritis who presents to the Emergency Department complaining of bilateral hand pain and RA "flare" x1 week.  She noticed a "knot to her medial right wrist last evening.  She states the area is itchy.  She believes that she was bitten by some type of insect.  She also is having wrist pain and swelling secondary to her RA flare.  She is followed by rheumatology but reports increasing flares.  No injury.  She denies fever, chills, numbness or weakness of her hands or wrist.   Home Medications Prior to Admission medications   Medication Sig Start Date End Date Taking? Authorizing Provider  acetaminophen (TYLENOL 8 HOUR ARTHRITIS PAIN) 650 MG CR tablet Take 650 mg by mouth every 8 (eight) hours as needed for pain.    [provider]  albuterol (VENTOLIN HFA) 108 (90 Base) MCG/ACT inhaler Inhale 1-2 puffs into the lungs every 4 (four) hours as needed for wheezing or shortness of breath. 02/14/21   Melynda Ripple, MD  ALPRAZolam Duanne Moron) 1 MG tablet Take 1 mg by mouth 2 (two) times daily.    [provider]  amphetamine-dextroamphetamine (ADDERALL) 20 MG tablet Take 20 mg by mouth daily.    [provider]  Calcium Carb-Cholecalciferol (CALCIUM-VITAMIN D) 600-400 MG-UNIT TABS Take 1 tablet by mouth daily.     [provider]  cyclobenzaprine (FLEXERIL) 5 MG tablet Take 1 tablet (5 mg total) by mouth 3 (three) times daily as needed. 03/03/21   Menshew,  Dannielle Karvonen, PA-C  fluticasone (FLONASE) 50 MCG/ACT nasal spray Place 2 sprays into both nostrils daily. 02/14/21   Melynda Ripple, MD  fluticasone furoate-vilanterol (BREO ELLIPTA) 100-25 MCG/INH AEPB Inhale 1 puff into the lungs daily.    [provider]  ibuprofen (ADVIL) 600 MG tablet Take 1 tablet (600 mg total) by mouth every 6 (six) hours as needed. 02/14/21   Melynda Ripple, MD  menthol-cetylpyridinium (CEPACOL REGULAR STRENGTH) 3 MG lozenge Take 1 lozenge (3 mg total) by mouth as needed for sore throat. 09/30/19   Avegno, Darrelyn Hillock, FNP  Multiple Vitamins-Minerals (MULTIVITAMIN WITH MINERALS) tablet Take 1 tablet by mouth daily.    [provider]  Nicotine 21-14-7 MG/24HR KIT Use as directed. 08/18/19   Brand Males, MD  oxyCODONE-acetaminophen (PERCOCET) 5-325 MG tablet Take 1 tablet by mouth every 4 (four) hours as needed for moderate pain. 03/19/49   Delora Fuel, MD  predniSONE (DELTASONE) 50 MG tablet Take 1 tablet daily with breakfast for 5 days. 10/21/21   Volney American, PA-C  promethazine-dextromethorphan (PROMETHAZINE-DM) 6.25-15 MG/5ML syrup Take 5 mLs by mouth 4 (four) times daily as needed for cough. 02/14/21   Melynda Ripple, MD  Spacer/Aero-Holding Chambers (AEROCHAMBER PLUS) inhaler Use with inhaler 02/14/21   Melynda Ripple, MD      Allergies    Morphine and related    Review of Systems  Review of Systems  Constitutional:  Negative for chills and fever.  Respiratory:  Negative for shortness of breath.   Cardiovascular:  Negative for chest pain.  Musculoskeletal:  Positive for arthralgias.  Skin:  Positive for color change and wound.  Neurological:  Negative for weakness, numbness and headaches.    Physical Exam Updated Vital Signs BP (!) 145/105 (BP Location: Left Arm)   Pulse 84   Temp 98.3 F (36.8 C) (Oral)   Resp 18   Ht '5\' 4"'  (1.626 m)   Wt 47.6 kg   LMP 03/09/2011   SpO2 93%   BMI 18.02 kg/m  Physical  Exam Vitals and nursing note reviewed.  Constitutional:      General: She is not in acute distress.    Appearance: Normal appearance. She is normal weight.  Cardiovascular:     Rate and Rhythm: Normal rate and regular rhythm.     Pulses: Normal pulses.  Pulmonary:     Effort: Pulmonary effort is normal.  Musculoskeletal:        General: Tenderness present.  Skin:    General: Skin is warm.     Findings: Erythema present. No bruising.     Comments: 2 cm indurated area to the medial right wrist.  2 small central pustules present.  No fluctuance.  Mild surrounding erythema without lymphangitis.  Multiple enlarged joints of the fingers of bilateral hands.  No erythema.    Neurological:     Mental Status: She is alert.     ED Results / Procedures / Treatments   Labs (all labs ordered are listed, but only abnormal results are displayed) Labs Reviewed - No data to display  EKG None  Radiology No results found.  Procedures Procedures    Medications Ordered in ED Medications  dexamethasone (DECADRON) injection 10 mg (has no administration in time range)  cephALEXin (KEFLEX) capsule 500 mg (has no administration in time range)    ED Course/ Medical Decision Making/ A&P                           Medical Decision Making Patient here with known history of RA and frequent flares.  She is followed by neurology but states her current treatment regimen is not helping.  Here for itching and nodule of the right wrist with possible insect bite.  On exam, patient uncomfortable appearing has multiple inflamed nodules of the bilateral hands.  No lymphangitis.  Neurovascularly intact.  Differential would include but not limited to insect bite, abscess, RA flare  Amount and/or Complexity of Data Reviewed Discussion of management or test interpretation with external provider(s): Patient does have a indurated nodule to the medial aspect of the right wrist there is 2 small pustules  centrally located.  No lymphangitis.  There is a small amount of surrounding erythema.  I suspect this is developing abscess.  No fluctuance at this time to indicate I&D.  She is also having an RA flare.  I will give IM Decadron here and start oral antibiotics.  She agrees to warm compresses and close outpatient follow-up.  Return precautions were discussed.   Risk Prescription drug management.           Final Clinical Impression(s) / ED Diagnoses Final diagnoses:  Insect bite of right wrist, initial encounter  Bilateral hand pain    Rx / DC Orders ED Discharge Orders     None  Kem Parkinson, PA-C 11/06/21 1615    Milton Ferguson, MD 11/07/21 [phone]

## 2021-11-04 NOTE — ED Triage Notes (Signed)
RA flareup x1 week in bilateral hands. Pt reports a knot developed on right wrist and it started to itch. Pt states she is unsure if something bit her.

## 2022-01-09 ENCOUNTER — Ambulatory Visit
Admission: EM | Admit: 2022-01-09 | Discharge: 2022-01-09 | Disposition: A | Discharge status: Home / Self Care | Payer: Medicaid Other | Attending: Nurse Practitioner | Admitting: Nurse Practitioner

## 2022-01-09 DIAGNOSIS — M069 Rheumatoid arthritis, unspecified: Secondary | ICD-10-CM | POA: Diagnosis not present

## 2022-01-09 MED ORDER — METHYLPREDNISOLONE SODIUM SUCC 125 MG IJ SOLR
60.0000 mg | Freq: Once | INTRAMUSCULAR | Status: AC
  Administered 2022-01-09: 60 mg via INTRAMUSCULAR

## 2022-01-09 MED ORDER — PREDNISONE 50 MG PO TABS
ORAL_TABLET | ORAL | 0 refills | Status: DC
Start: 2022-01-09 — End: 2022-01-25

## 2022-01-09 NOTE — Discharge Instructions (Addendum)
Take medication as prescribed. May apply ice to help with pain and swelling of the hands as needed. May take over-the-counter Tylenol arthritis strength as needed for pain or discomfort. Please follow-up with your rheumatologist regarding continued flares of your RA. Follow-up as needed.   

## 2022-01-09 NOTE — ED Triage Notes (Signed)
Pain and burning in both hands, that started today. Took ibuprofen but hasn't helped with pain.

## 2022-01-09 NOTE — ED Provider Notes (Signed)
[facility]    CSN: [id number] Arrival date & time: 01/09/22  1737      History   Chief Complaint Chief Complaint  Patient presents with   Hand Problem    EXTREMELY BAD RA FLAREUP, need prednisone shot and prescription asap - Entered by patient   Hand Pain    HPI Linda Calderon is a 46 y.o. female.   The history is provided by the patient.    Patient presenting today with 3-day history of pain, swelling, bruising, stiffness to bilateral hands. Denies any known injury, history of severe uncontrolled rheumatoid arthritis and states she always comes in for flares and gets a steroid shot and prednisone which is the only thing that seems to help.  Patient states that she received an Humira injection 1 day ago, but she continues to have a flare.  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 Authorizing Provider  acetaminophen (TYLENOL 8 HOUR ARTHRITIS PAIN) 650 MG CR tablet Take 650 mg by mouth every 8 (eight)  hours as needed for pain.   Yes [provider]  ALPRAZolam Duanne Moron) 1 MG tablet Take 1 mg by mouth 2 (two) times daily.   Yes [provider]  amphetamine-dextroamphetamine (ADDERALL) 20 MG tablet Take 20 mg by mouth daily.   Yes [provider]  Multiple Vitamins-Minerals (MULTIVITAMIN WITH MINERALS) tablet Take 1 tablet by mouth daily.   Yes [provider]  predniSONE (DELTASONE) 50 MG tablet Take one tablet daily with breakfast for 5 days. 01/09/22  Yes Alexsis Branscom-Warren, Alda Lea, NP  albuterol (VENTOLIN HFA) 108 (90 Base) MCG/ACT inhaler Inhale 1-2 puffs into the lungs every 4 (four) hours as needed for wheezing or shortness of breath. 02/14/21   Melynda Ripple, MD  Calcium Carb-Cholecalciferol (CALCIUM-VITAMIN D) 600-400 MG-UNIT TABS Take 1 tablet by mouth daily.     [provider]  cephALEXin (KEFLEX) 500 MG capsule Take 1 capsule (500 mg total) by mouth 3 (three) times daily. 11/04/21   Triplett, Tammy, PA-C  cyclobenzaprine (FLEXERIL) 5 MG tablet Take 1 tablet (5 mg total) by mouth 3 (three) times daily as needed. 03/03/21   Menshew, Dannielle Karvonen, PA-C  fluticasone (FLONASE) 50 MCG/ACT nasal spray Place 2 sprays into both nostrils daily. 02/14/21  Melynda Ripple, MD  fluticasone furoate-vilanterol (BREO ELLIPTA) 100-25 MCG/INH AEPB Inhale 1 puff into the lungs daily.    [provider]  ibuprofen (ADVIL) 600 MG tablet Take 1 tablet (600 mg total) by mouth every 6 (six) hours as needed. 02/14/21   Melynda Ripple, MD  menthol-cetylpyridinium (CEPACOL REGULAR STRENGTH) 3 MG lozenge Take 1 lozenge (3 mg total) by mouth as needed for sore throat. 09/30/19   Emerson Monte, FNP  Nicotine 21-14-7 MG/24HR KIT Use as directed. 08/18/19   Brand Males, MD  oxyCODONE-acetaminophen (PERCOCET) 5-325 MG tablet Take 1 tablet by mouth every 4 (four) hours as needed for moderate pain. 04/21/13   Delora Fuel, MD  promethazine-dextromethorphan  (PROMETHAZINE-DM) 6.25-15 MG/5ML syrup Take 5 mLs by mouth 4 (four) times daily as needed for cough. 02/14/21   Melynda Ripple, MD  Spacer/Aero-Holding Chambers (AEROCHAMBER PLUS) inhaler Use with inhaler 02/14/21   Melynda Ripple, MD  triamcinolone cream (KENALOG) 0.1 % Apply 1 Application topically 2 (two) times daily. 11/04/21   Kem Parkinson, PA-C    Family History Family History  Problem Relation Age of Onset   Hypertension Mother    Hypertension Father    Cancer Father     Social History Social History   Tobacco Use   Smoking status: Every Day    Packs/day: 0.50    Years: 10.00    Total pack years: 5.00    Types: Cigarettes   Smokeless tobacco: Never  Vaping Use   Vaping Use: Never used  Substance Use Topics   Alcohol use: No   Drug use: Yes    Types: Marijuana    Comment: last use 06/01/20     Allergies   Morphine and related   Review of Systems Review of Systems   Physical Exam Triage Vital Signs ED Triage Vitals  Enc Vitals Group     BP 01/09/22 1954 (!) 137/90     Pulse Rate 01/09/22 1954 94     Resp 01/09/22 1954 18     Temp 01/09/22 1954 97.9 F (36.6 C)     Temp Source 01/09/22 1954 Oral     SpO2 01/09/22 1954 94 %     Weight --      Height --      Head Circumference --      Peak Flow --      Pain Score 01/09/22 1953 8     Pain Loc --      Pain Edu? --      Excl. in [place]? --    No data found.  Updated Vital Signs BP (!) 137/90 (BP Location: Right Arm)   Pulse 94   Temp 97.9 F (36.6 C) (Oral)   Resp 18   LMP 03/09/2011   SpO2 94%   Visual Acuity Right Eye Distance:   Left Eye Distance:   Bilateral Distance:    Right Eye Near:   Left Eye Near:    Bilateral Near:     Physical Exam Vitals and nursing note reviewed.  Constitutional:      General: She is not in acute distress.    Appearance: Normal appearance.  HENT:     Head: Normocephalic.  Eyes:     Extraocular Movements: Extraocular movements intact.     Pupils:  Pupils are equal, round, and reactive to light.  Cardiovascular:     Pulses: Normal pulses.     Heart sounds: Normal heart sounds.  Pulmonary:     Effort: Pulmonary effort  is normal. No respiratory distress.     Breath sounds: Normal breath sounds. No stridor. No wheezing, rhonchi or rales.  Abdominal:     General: Bowel sounds are normal.     Palpations: Abdomen is soft.  Musculoskeletal:     Cervical back: Normal range of motion.     Comments: Erythema, swelling noted to the metacarpohalangeal joints of the bilateral hands.  Deformity noted to the index and middle fingers of the right hand. Tender, erythematous nodules are noted the bilateral hands.  Lymphadenopathy:     Cervical: No cervical adenopathy.  Neurological:     General: No focal deficit present.     Mental Status: She is alert and oriented to person, place, and time.  Psychiatric:        Mood and Affect: Mood normal.        Behavior: Behavior normal.      UC Treatments / Results  Labs (all labs ordered are listed, but only abnormal results are displayed) Labs Reviewed - No data to display  EKG   Radiology No results found.  Procedures Procedures (including critical care time)  Medications Ordered in UC Medications  methylPREDNISolone sodium succinate (SOLU-MEDROL) 125 mg/2 mL injection 60 mg (60 mg Intramuscular Given 01/09/22 2024)    Initial Impression / Assessment and Plan / UC Course  I have reviewed the triage vital signs and the nursing notes.  Pertinent labs & imaging results that were available during my care of the patient were reviewed by me and considered in my medical decision making (see chart for details).  The patient is well-appearing, she is in no acute distress, vital signs are stable.  Symptoms are consistent with a RA flare.  Patient presents with joint pain and swelling has been present for the past 3 days.  On exam, patient has rheumatoid nodules noted to her hands.  Medrol 60 mg  IM was administered in the clinic.  Will start patient on prednisone 50 mg for 5 days.  Patient was advised to follow-up with her rheumatologist for continued RA flares. Follow-up as needed.    Final Clinical Impressions(s) / UC Diagnoses   Final diagnoses:  Rheumatoid arthritis flare ([place])     Discharge Instructions      Take medication as prescribed. May apply ice to help with pain and swelling of the hands as needed. May take over-the-counter Tylenol arthritis strength as needed for pain or discomfort. Please follow-up with your rheumatologist regarding continued flares of your RA. Follow-up as needed.      ED Prescriptions     Medication Sig Dispense Auth. Provider   predniSONE (DELTASONE) 50 MG tablet Take one tablet daily with breakfast for 5 days. 5 tablet Naryiah Schley-Warren, Alda Lea, NP      PDMP not reviewed this encounter.   Tish Men, NP 01/09/22 2045

## 2022-01-16 ENCOUNTER — Telehealth: Payer: Self-pay | Admitting: Urgent Care

## 2022-01-16 DIAGNOSIS — U071 COVID-19: Secondary | ICD-10-CM

## 2022-01-16 NOTE — Progress Notes (Signed)
Thank you for the details you included in the comment boxes. Those details are very helpful in determining the best course of treatment for you and help [place] to provide the best care. Because you are positive for covid, we recommend that you convert this visit to a video visit in order for the provider to better assess what is going on. We are unable to treat covid positive patients through an evisit. The provider will be able to give you a more accurate diagnosis and treatment plan if we can more freely discuss your symptoms and with the addition of a virtual examination.   If you convert to a video visit, we will bill your insurance (similar to an office visit) and you will not be charged for this e-Visit. You will be able to stay at home and speak with the first available [facility] advanced practice provider. The link to do a video visit is in the drop down Menu tab of your Welcome screen in MyChart.

## 2022-01-17 ENCOUNTER — Telehealth: Payer: Medicaid Other

## 2022-01-25 ENCOUNTER — Ambulatory Visit
Admission: EM | Admit: 2022-01-25 | Discharge: 2022-01-25 | Disposition: A | Discharge status: Home / Self Care | Payer: Medicaid Other

## 2022-01-25 DIAGNOSIS — M069 Rheumatoid arthritis, unspecified: Secondary | ICD-10-CM

## 2022-01-25 MED ORDER — PREDNISONE 50 MG PO TABS: ORAL_TABLET | ORAL | 0 refills | Status: DC

## 2022-01-25 MED ORDER — METHYLPREDNISOLONE SODIUM SUCC 125 MG IJ SOLR
60.0000 mg | Freq: Once | INTRAMUSCULAR | Status: AC
  Administered 2022-01-25: 60 mg via INTRAMUSCULAR

## 2022-01-25 NOTE — Discharge Instructions (Addendum)
Take medication as prescribed. May apply ice to help with pain and swelling of the hands as needed. May take over-the-counter Tylenol arthritis strength as needed for pain or discomfort. Please follow-up with your rheumatologist regarding continued flares of your RA. Follow-up as needed.   

## 2022-01-25 NOTE — ED Provider Notes (Signed)
[facility]    CSN: [id number] Arrival date & time: 01/25/22  1310      History   Chief Complaint Chief Complaint  Patient presents with   Hand Pain    HPI Linda Calderon is a 47 y.o. female.   The history is provided by the patient.   Patient presents for complaints of joint swelling in her bilateral hands.  Symptoms have been present for the past 3 days.  Patient has a known history of rheumatoid arthritis.  Patient reports swelling is greater in the right hand versus the left.  She complains of pain, tenderness, decreased grip strength.  She has been taking Tylenol for her symptoms.  Patient reports she has been taking her medications for her RA consistently, but she continues to experience symptoms.  Patient was seen in this clinic for the same or similar symptoms on 01/09/2022.  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 Authorizing Provider  Adalimumab 40 MG/0.4ML PNKT Inject into the skin. 12/12/21  Yes [provider]  buprenorphine (SUBUTEX) 8 MG SUBL SL tablet Place 8 mg under the tongue 3 (three) times daily. 08/22/21  Yes [provider]  ibuprofen (ADVIL) 200 MG tablet Take 200 mg by mouth every 6 (six) hours as needed.   Yes [provider]  predniSONE (DELTASONE) 50 MG tablet Take 1 tablet daily with breakfast for the next 5 days. 01/25/22  Yes Kaitlynn Tramontana-Warren, Alda Lea, NP  acetaminophen (TYLENOL 8 HOUR ARTHRITIS PAIN) 650 MG CR tablet Take 650 mg by mouth every 8 (eight) hours as needed for pain.    [provider]  ALPRAZolam Duanne Moron) 1 MG tablet Take 1 mg by mouth 2 (two) times daily.    [provider]  amphetamine-dextroamphetamine (ADDERALL) 20 MG tablet Take 20 mg by mouth daily.    [provider]  cephALEXin (KEFLEX) 500 MG capsule Take 1 capsule (500 mg total) by mouth 3 (three) times daily. 11/04/21   Triplett, Tammy, PA-C  cyclobenzaprine (FLEXERIL) 5 MG tablet Take 1  tablet (5 mg total) by mouth 3 (three) times daily as needed. 03/03/21   Menshew, Dannielle Karvonen, PA-C  Multiple Vitamins-Minerals (MULTIVITAMIN WITH MINERALS) tablet Take 1 tablet by mouth daily.    [provider]  oxyCODONE-acetaminophen (PERCOCET) 5-325 MG tablet Take 1 tablet by mouth every 4 (four) hours as needed for moderate pain. 07/25/71   Delora Fuel, MD  triamcinolone cream (KENALOG) 0.1 % Apply 1 Application topically 2 (two) times daily. 11/04/21   Kem Parkinson, PA-C    Family History Family History  Problem Relation Age of Onset   Hypertension Mother    Hypertension Father    Cancer Father     Social History Social History   Tobacco Use   Smoking status: Every Day    Packs/day: 0.50    Years: 10.00    Total pack years: 5.00    Types:  Cigarettes   Smokeless tobacco: Never  Vaping Use   Vaping Use: Never used  Substance Use Topics   Alcohol use: No   Drug use: Yes    Types: Marijuana    Comment: last use 06/01/20     Allergies   Morphine and related   Review of Systems Review of Systems Per HPI  Physical Exam Triage Vital Signs ED Triage Vitals  Enc Vitals Group     BP 01/25/22 1505 (!) 135/97     Pulse Rate 01/25/22 1505 69     Resp 01/25/22 1505 17     Temp 01/25/22 1505 (!) 97.4 F (36.3 C)     Temp Source 01/25/22 1505 Oral     SpO2 01/25/22 1505 93 %     Weight --      Height --      Head Circumference --      Peak Flow --      Pain Score 01/25/22 1507 8     Pain Loc --      Pain Edu? --      Excl. in [place]? --    No data found.  Updated Vital Signs BP (!) 135/97 (BP Location: Right Arm)   Pulse 69   Temp (!) 97.4 F (36.3 C) (Oral)   Resp 17   LMP 03/09/2011   SpO2 93%   Visual Acuity Right Eye Distance:   Left Eye Distance:   Bilateral Distance:    Right Eye Near:   Left Eye Near:    Bilateral Near:     Physical Exam Vitals and nursing note reviewed.  Constitutional:      General: She is not in acute distress.    Appearance: She is well-developed.  Cardiovascular:     Rate and Rhythm: Normal rate and regular rhythm.     Heart sounds: Normal heart sounds.  Pulmonary:     Effort: Pulmonary effort is normal.     Breath sounds: Normal breath sounds.  Abdominal:     General: Bowel sounds are normal. There is no distension.     Palpations: Abdomen is soft.     Tenderness: There is no abdominal tenderness. There is no guarding or rebound.  Genitourinary:    Vagina: Normal. No vaginal discharge.  Musculoskeletal:     Right hand: Swelling, deformity and tenderness present. Decreased range of motion. Normal capillary refill. Normal pulse.     Left hand: Swelling, deformity and tenderness present. Decreased range of motion. Normal capillary refill. Normal pulse.     Cervical  back: Normal range of motion.  Comments:  Erythema, swelling noted to the metacarpohalangeal joints of the bilateral hands.  Deformity noted to the index and middle fingers of the right hand. Tender, erythematous nodules are noted the bilateral hands.   Lymphadenopathy:     Cervical: No cervical adenopathy.  Skin:    General: Skin is warm and dry.     Findings: No erythema or rash.  Neurological:     Mental Status: She is alert and oriented to person, place, and time.     Cranial Nerves: No cranial nerve deficit.  Psychiatric:        Behavior: Behavior normal.      UC Treatments / Results  Labs (all labs ordered are listed, but only abnormal results are displayed) Labs Reviewed - No data to display  EKG   Radiology No results found.  Procedures Procedures (including critical care time)  Medications Ordered in UC Medications  methylPREDNISolone sodium succinate (SOLU-MEDROL) 125 mg/2 mL injection 60 mg (has no administration in time range)    Initial Impression / Assessment and Plan / UC Course  I have reviewed the triage vital signs and the nursing notes.  Pertinent labs & imaging results that were available during my care of the patient were reviewed by me and considered in my medical decision making (see chart for details).  The patient is well-appearing, she is in no acute distress, vital signs are stable.  Symptoms are consistent with a RA flare.  Patient presents with joint pain and swelling has been present for the past 3 days.  On exam, patient has rheumatoid nodules noted to her hands.  Medrol 60 mg IM was administered in the clinic.  Will start patient on prednisone 50 mg for 5 days.  Asked with patient to follow-up with rheumatology for continued and more frequent RA flares.  Patient was advised to follow-up with her rheumatologist for continued RA flares. Follow-up as needed.    Final Clinical Impressions(s) / UC Diagnoses   Final diagnoses:  Rheumatoid  arthritis flare ([place])     Discharge Instructions      Take medication as prescribed. May apply ice to help with pain and swelling of the hands as needed. May take over-the-counter Tylenol arthritis strength as needed for pain or discomfort. Please follow-up with your rheumatologist regarding continued flares of your RA. Follow-up as needed.     ED Prescriptions     Medication Sig Dispense Auth. Provider   predniSONE (DELTASONE) 50 MG tablet Take 1 tablet daily with breakfast for the next 5 days. 5 tablet Odelia Graciano-Warren, Alda Lea, NP      PDMP not reviewed this encounter.   Tish Men, NP 01/25/22 1527

## 2022-01-25 NOTE — ED Triage Notes (Signed)
Pt reports pain and swelling in hands and wrist x 3 days. Reports she had rheumatoids arthritis. Tylenol, ibuprofen, heating pads gives no relief.

## 2022-01-26 ENCOUNTER — Ambulatory Visit: Payer: Medicaid Other

## 2022-01-28 ENCOUNTER — Telehealth: Payer: Self-pay | Admitting: Emergency Medicine

## 2022-01-28 NOTE — Telephone Encounter (Signed)
Pt called and requested work note be sent to [employer] Consulted NP and reported pt could return to work on 1/9.Work note electronically sent.

## 2022-02-05 ENCOUNTER — Ambulatory Visit
Admission: RE | Admit: 2022-02-05 | Discharge: 2022-02-05 | Disposition: A | Discharge status: Home / Self Care | Payer: Medicaid Other | Source: Ambulatory Visit | Attending: Nurse Practitioner | Admitting: Nurse Practitioner

## 2022-02-05 VITALS — BP 144/99 | HR 84 | Temp 97.9°F | Resp 20

## 2022-02-05 DIAGNOSIS — M069 Rheumatoid arthritis, unspecified: Secondary | ICD-10-CM

## 2022-02-05 HISTORY — DX: Unspecified osteoarthritis, unspecified site: M19.90

## 2022-02-05 MED ORDER — PREDNISONE 10 MG (21) PO TBPK: ORAL_TABLET | ORAL | 0 refills | Status: AC

## 2022-02-05 MED ORDER — METHYLPREDNISOLONE SODIUM SUCC 125 MG IJ SOLR
60.0000 mg | Freq: Once | INTRAMUSCULAR | Status: AC
  Administered 2022-02-05: 60 mg via INTRAMUSCULAR

## 2022-02-05 NOTE — ED Provider Notes (Signed)
[facility]    CSN: [id number] Arrival date & time: 02/05/22  1239      History   Chief Complaint Chief Complaint  Patient presents with   Hand Problem    RA flare up - Entered by patient    HPI Linda Calderon is a 47 y.o. female.   Patient presents today for flare of rheumatoid arthritis.  Reports symptoms have been ongoing for the past few days.  Reports her right wrist is swollen and joints in the right hand are swollen.  It is painful to move her wrist back-and-forth and to move her hand at all.  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  Reports she has a history of rheumatoid arthritis, is currently taking Humira and Leflunomide.  Reports she last saw her rheumatologist in December and leflunomide was increased.  She does not feel like this is helping.  Reports anytime she tries to talk to her rheumatologist about it, they " do not listen."  Patient denies fevers, bodyaches or chills.  No cough, sore throat, or congestion.  No new muscle pain or joint aches.  No red or swollen joints.    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 Authorizing Provider  predniSONE (STERAPRED UNI-PAK 21 TAB) 10 MG (21) TBPK tablet Take 6 tablets (60 mg total) by mouth daily for 1 day, THEN 5 tablets (50 mg total) daily for 1 day, THEN 4 tablets (40 mg total) daily for 1 day, THEN 3 tablets (30 mg total) daily for 1 day, THEN 2 tablets (20 mg total) daily for 1 day, THEN 1 tablet (10 mg total) daily for 1 day. 02/05/22 02/11/22 Yes Eulogio Bear, NP  acetaminophen (TYLENOL 8 HOUR ARTHRITIS PAIN) 650 MG CR tablet Take 650 mg by mouth every 8 (eight) hours as needed for pain.    [provider]  Adalimumab 40 MG/0.4ML PNKT Inject into the skin. 12/12/21   [provider]  ALPRAZolam Duanne Moron) 1 MG tablet  Take 1 mg by mouth 2 (two) times daily.    [provider]  amphetamine-dextroamphetamine (ADDERALL) 20 MG tablet Take 20 mg by mouth daily.    [provider]  buprenorphine (SUBUTEX) 8 MG SUBL SL tablet Place 8 mg under the tongue 3 (three) times daily. 08/22/21   [provider]  ibuprofen (ADVIL) 200 MG tablet Take 200 mg by mouth every 6 (six) hours as needed.    [provider]  Multiple Vitamins-Minerals (MULTIVITAMIN WITH MINERALS) tablet Take 1 tablet by mouth daily.    [provider]    Family History Family History  Problem Relation Age of Onset   Hypertension Mother    Hypertension Father    Cancer Father     Social History Social History   Tobacco Use   Smoking status: Every Day    Packs/day: 0.50    Years: 10.00    Total pack years: 5.00    Types: Cigarettes   Smokeless tobacco: Never  Vaping Use   Vaping Use: Never  used  Substance Use Topics   Alcohol use: No   Drug use: Yes    Types: Marijuana    Comment: last use 06/01/20     Allergies   Morphine and related   Review of Systems Review of Systems Per HPI  Physical Exam Triage Vital Signs ED Triage Vitals  Enc Vitals Group     BP 02/05/22 1303 (!) 144/99     Pulse Rate 02/05/22 1303 84     Resp 02/05/22 1303 20     Temp 02/05/22 1303 97.9 F (36.6 C)     Temp Source 02/05/22 1303 Oral     SpO2 02/05/22 1303 94 %     Weight --      Height --      Head Circumference --      Peak Flow --      Pain Score 02/05/22 1305 7     Pain Loc --      Pain Edu? --      Excl. in [place]? --    No data found.  Updated Vital Signs BP (!) 144/99 (BP Location: Right Arm)   Pulse 84   Temp 97.9 F (36.6 C) (Oral)   Resp 20   LMP 03/09/2011   SpO2 94%   Visual Acuity Right Eye Distance:   Left Eye Distance:   Bilateral Distance:    Right Eye Near:   Left Eye Near:    Bilateral Near:     Physical Exam Vitals and nursing note reviewed.  Constitutional:      General: She is not in acute distress.    Appearance: Normal appearance. She is not toxic-appearing.  HENT:     Mouth/Throat:     Mouth: Mucous membranes are moist.     Pharynx: Oropharynx is clear.  Pulmonary:     Effort: Pulmonary effort is normal. No respiratory distress.  Musculoskeletal:     Right wrist: Swelling, tenderness and bony tenderness present. No effusion. Normal range of motion. Normal pulse.     Right hand: Deformity, tenderness and bony tenderness present. No swelling. Decreased range of motion. Normal sensation. Normal capillary refill.  Skin:    General: Skin is warm and dry.     Capillary Refill: Capillary refill takes less than 2 seconds.     Coloration: Skin is not jaundiced or pale.     Findings: No erythema.  Neurological:     Mental Status: She  is alert and oriented to person, place, and time.  Psychiatric:        Behavior: Behavior is cooperative.       UC Treatments / Results  Labs (all labs ordered are listed, but only abnormal results are displayed) Labs Reviewed - No data to display  EKG   Radiology No results found.  Procedures Procedures (including critical care time)  Medications Ordered in UC Medications  methylPREDNISolone sodium succinate (SOLU-MEDROL) 125 mg/2 mL injection 60 mg (60 mg Intramuscular Given 02/05/22 1338)    Initial Impression / Assessment and Plan / UC Course  I have reviewed the triage vital signs and the nursing notes.  Pertinent labs & imaging results that were available during my care of the patient were reviewed by me and considered in my medical decision making (see chart for details).   Patient is well-appearing, normotensive, afebrile, not tachycardic, not tachypneic, oxygenating well on room air.    Flare of rheumatoid arthritis ([place]) Treated with Solu-Medrol 60 mg IM today in urgent care along with prednisone taper Recommended close follow-up with rheumatologist and contact info given for a new rheumatologist if she would like a second opinion on her treatment plan Note given for work  The patient was given the opportunity to ask questions.  All questions answered to their satisfaction.  The patient is in agreement to this plan.    Final Clinical Impressions(s) / UC Diagnoses   Final diagnoses:  Flare of rheumatoid arthritis ([place])     Discharge Instructions      We have given you a shot of Solu-Medrol today which is a steroid and should help with the pain in your wrist and hand.  Please start the oral prednisone tomorrow to help with inflammation and pain in your right wrist and hand.  Please follow-up with your rheumatologist; I have also provided contact info for a rheumatologist in [place] if you desire to reach out to a new office    ED Prescriptions     Medication Sig Dispense Auth. Provider   predniSONE (STERAPRED UNI-PAK 21 TAB) 10 MG (21) TBPK tablet Take 6  tablets (60 mg total) by mouth daily for 1 day, THEN 5 tablets (50 mg total) daily for 1 day, THEN 4 tablets (40 mg total) daily for 1 day, THEN 3 tablets (30 mg total) daily for 1 day, THEN 2 tablets (20 mg total) daily for 1 day, THEN 1 tablet (10 mg total) daily for 1 day. 21 tablet Eulogio Bear, NP      I have reviewed the PDMP during this encounter.   Eulogio Bear, NP 02/05/22 1650

## 2022-02-05 NOTE — Discharge Instructions (Signed)
We have given you a shot of Solu-Medrol today which is a steroid and should help with the pain in your wrist and hand.  Please start the oral prednisone tomorrow to help with inflammation and pain in your right wrist and hand.  Please follow-up with your rheumatologist; I have also provided contact info for a rheumatologist in [place] if you desire to reach out to a new office

## 2022-02-05 NOTE — ED Triage Notes (Signed)
Pt reports an arthritis flare up in her right wrist and hand  x 2 days ago.

## 2022-02-25 ENCOUNTER — Other Ambulatory Visit: Payer: Self-pay

## 2022-02-25 ENCOUNTER — Ambulatory Visit
Admission: RE | Admit: 2022-02-25 | Discharge: 2022-02-25 | Disposition: A | Discharge status: Home / Self Care | Payer: Medicaid Other | Source: Ambulatory Visit | Attending: Nurse Practitioner | Admitting: Nurse Practitioner

## 2022-02-25 VITALS — BP 130/95 | HR 85 | Temp 98.3°F | Resp 20

## 2022-02-25 DIAGNOSIS — M069 Rheumatoid arthritis, unspecified: Secondary | ICD-10-CM | POA: Diagnosis not present

## 2022-02-25 MED ORDER — PREDNISONE 10 MG (21) PO TBPK: ORAL_TABLET | ORAL | 0 refills | Status: AC

## 2022-02-25 MED ORDER — METHYLPREDNISOLONE SODIUM SUCC 125 MG IJ SOLR
60.0000 mg | Freq: Once | INTRAMUSCULAR | Status: AC
  Administered 2022-02-25: 60 mg via INTRAMUSCULAR

## 2022-02-25 NOTE — Discharge Instructions (Addendum)
We have given you a shot of Solu-Medrol today to help with the inflammation in your joints.  Please start the oral prednisone tomorrow.  Please follow-up with your specialist and primary care provider as planned.

## 2022-02-25 NOTE — ED Provider Notes (Signed)
[facility]    CSN: [id number] Arrival date & time: 02/25/22  1203      History   Chief Complaint Chief Complaint  Patient presents with   Hand Problem    RA FLAREUP - Entered by patient    HPI Linda Calderon is a 47 y.o. female.   Patient presents today for 1 week history of rheumatoid arthritis flare.  Reports both of her wrists are swollen as well as the joints in her hands and fingers.  It is painful to move her hands and fingers at all.  Reports that she currently takes Humira and leflunomide for her rheumatoid arthritis.  Reports her leflunomide was recently increased, however she does not like this is helping.  Reports that her rheumatologist will not see her more frequently than every 3 months and she is currently waiting for a referral for a second opinion with rheumatologist at [facility].  Patient denies fevers or chills since the pain began.  No cough or sore throat.    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 Authorizing Provider  predniSONE (STERAPRED UNI-PAK 21 TAB) 10 MG (21) TBPK tablet Take 6 tablets (60 mg total) by mouth daily for 1 day, THEN 5 tablets (50 mg total) daily for 1 day, THEN 4 tablets (40 mg total) daily for 1 day, THEN 3 tablets (30 mg total) daily for 1 day, THEN 2 tablets (20 mg total) daily for 1 day, THEN 1 tablet (10 mg total) daily for 1 day. 02/25/22 03/03/22 Yes Eulogio Bear, NP  acetaminophen (TYLENOL 8 HOUR ARTHRITIS PAIN) 650 MG CR tablet Take 650 mg by mouth every 8 (eight) hours as needed for pain.    [provider]  Adalimumab 40 MG/0.4ML PNKT Inject into the skin. 12/12/21   [provider]  ALPRAZolam Duanne Moron) 1 MG tablet Take 1 mg by mouth 2 (two) times daily.    [provider]  amphetamine-dextroamphetamine (ADDERALL) 20 MG tablet Take 20 mg by mouth daily.  [provider]  buprenorphine (SUBUTEX) 8 MG SUBL SL tablet Place 8 mg under the tongue 3 (three) times daily. 08/22/21   [provider]  ibuprofen (ADVIL) 200 MG tablet Take 200 mg by mouth every 6 (six) hours as needed.    [provider]  Multiple Vitamins-Minerals (MULTIVITAMIN WITH MINERALS) tablet Take 1 tablet by mouth daily.    [provider]    Family History Family History  Problem Relation Age of Onset   Hypertension Mother    Hypertension Father    Cancer Father     Social History Social History   Tobacco Use   Smoking status: Every Day    Packs/day: 0.50    Years: 10.00    Total pack years: 5.00    Types: Cigarettes   Smokeless tobacco: Never  Vaping Use   Vaping Use: Never used  Substance Use Topics   Alcohol use: No   Drug use: Yes    Types: Marijuana    Comment: last use 06/01/20     Allergies    Morphine and related   Review of Systems Review of Systems Per HPI  Physical Exam Triage Vital Signs ED Triage Vitals  Enc Vitals Group     BP 02/25/22 1239 (!) 130/95     Pulse Rate 02/25/22 1239 85     Resp 02/25/22 1239 20     Temp 02/25/22 1239 98.3 F (36.8 C)     Temp Source 02/25/22 1239 Oral     SpO2 02/25/22 1239 93 %     Weight --      Height --      Head Circumference --      Peak Flow --      Pain Score 02/25/22 1237 7     Pain Loc --      Pain Edu? --      Excl. in [place]? --    No data found.  Updated Vital Signs BP (!) 130/95 (BP Location: Right Arm)   Pulse 85   Temp 98.3 F (36.8 C) (Oral)   Resp 20   LMP 03/09/2011   SpO2 93%   Visual Acuity Right Eye Distance:   Left Eye Distance:   Bilateral Distance:    Right Eye Near:   Left Eye Near:    Bilateral Near:     Physical Exam Vitals and nursing note reviewed.  Constitutional:      General: She is not in acute distress.    Appearance: Normal appearance. She is not toxic-appearing.  HENT:     Mouth/Throat:     Mouth: Mucous membranes are moist.     Pharynx: Oropharynx is clear.  Pulmonary:     Effort: Pulmonary effort is normal. No respiratory distress.  Musculoskeletal:     Right wrist: Swelling, tenderness and bony tenderness present. No effusion. Normal range of motion. Normal pulse.     Left wrist: Swelling, tenderness and bony tenderness present. No effusion. Normal range of motion. Normal pulse.     Right hand: Deformity, tenderness and bony tenderness present. No swelling. Decreased range of motion. Normal sensation. Normal capillary refill.     Left hand: Deformity, tenderness and bony tenderness present. No swelling. Decreased range of motion. Normal sensation. Normal capillary refill.  Skin:    General: Skin is warm and dry.     Capillary Refill: Capillary refill takes less than 2 seconds.     Coloration: Skin is not jaundiced or pale.  Findings: No erythema.  Neurological:      Mental Status: She is alert and oriented to person, place, and time.  Psychiatric:        Behavior: Behavior is cooperative.      UC Treatments / Results  Labs (all labs ordered are listed, but only abnormal results are displayed) Labs Reviewed - No data to display  EKG   Radiology No results found.  Procedures Procedures (including critical care time)  Medications Ordered in UC Medications  methylPREDNISolone sodium succinate (SOLU-MEDROL) 125 mg/2 mL injection 60 mg (60 mg Intramuscular Given 02/25/22 1304)    Initial Impression / Assessment and Plan / UC Course  I have reviewed the triage vital signs and the nursing notes.  Pertinent labs & imaging results that were available during my care of the patient were reviewed by me and considered in my medical decision making (see chart for details).   Patient is well-appearing, normotensive, afebrile, not tachycardic, not tachypneic, oxygenating well on room air.    1. Flare of rheumatoid arthritis ([place]) Will treat patient with IM Solu-Medrol today in urgent care, start oral prednisone taper tomorrow Discussed close follow-up with primary care provider and rheumatologist  The patient was given the opportunity to ask questions.  All questions answered to their satisfaction.  The patient is in agreement to this plan.    Final Clinical Impressions(s) / UC Diagnoses   Final diagnoses:  Flare of rheumatoid arthritis ([place])     Discharge Instructions      We have given you a shot of Solu-Medrol today to help with the inflammation in your joints.  Please start the oral prednisone tomorrow.  Please follow-up with your specialist and primary care provider as planned.    ED Prescriptions     Medication Sig Dispense Auth. Provider   predniSONE (STERAPRED UNI-PAK 21 TAB) 10 MG (21) TBPK tablet Take 6 tablets (60 mg total) by mouth daily for 1 day, THEN 5 tablets (50 mg total) daily for 1 day, THEN 4 tablets (40 mg total)  daily for 1 day, THEN 3 tablets (30 mg total) daily for 1 day, THEN 2 tablets (20 mg total) daily for 1 day, THEN 1 tablet (10 mg total) daily for 1 day. 21 tablet Eulogio Bear, NP      PDMP not reviewed this encounter.   Eulogio Bear, NP 02/25/22 [phone]

## 2022-02-25 NOTE — ED Triage Notes (Signed)
Pt reports right hand pain and reports "feels like RA flare-up" x1 week. Denies any known injury.

## 2022-03-15 ENCOUNTER — Emergency Department (HOSPITAL_COMMUNITY)
Admission: EM | Admit: 2022-03-15 | Discharge: 2022-03-15 | Disposition: A | Discharge status: Home / Self Care | Payer: Medicaid Other | Attending: Emergency Medicine | Admitting: Emergency Medicine

## 2022-03-15 ENCOUNTER — Encounter (HOSPITAL_COMMUNITY): Payer: Self-pay | Admitting: *Deleted

## 2022-03-15 ENCOUNTER — Emergency Department (HOSPITAL_COMMUNITY): Payer: Medicaid Other

## 2022-03-15 ENCOUNTER — Other Ambulatory Visit: Payer: Self-pay

## 2022-03-15 DIAGNOSIS — J449 Chronic obstructive pulmonary disease, unspecified: Secondary | ICD-10-CM | POA: Insufficient documentation

## 2022-03-15 DIAGNOSIS — D72829 Elevated white blood cell count, unspecified: Secondary | ICD-10-CM | POA: Diagnosis not present

## 2022-03-15 DIAGNOSIS — Z1152 Encounter for screening for COVID-19: Secondary | ICD-10-CM | POA: Insufficient documentation

## 2022-03-15 DIAGNOSIS — R079 Chest pain, unspecified: Secondary | ICD-10-CM | POA: Diagnosis present

## 2022-03-15 LAB — DIFFERENTIAL
Abs Immature Granulocytes: 0.23 10*3/uL — ABNORMAL HIGH (ref 0.00–0.07)
Basophils Absolute: 0.1 10*3/uL (ref 0.0–0.1)
Basophils Relative: 1 %
Eosinophils Absolute: 0.1 10*3/uL (ref 0.0–0.5)
Eosinophils Relative: 1 %
Immature Granulocytes: 1 %
Lymphocytes Relative: 12 %
Lymphs Abs: 2.7 10*3/uL (ref 0.7–4.0)
Monocytes Absolute: 1.2 10*3/uL — ABNORMAL HIGH (ref 0.1–1.0)
Monocytes Relative: 5 %
Neutro Abs: 17.9 10*3/uL — ABNORMAL HIGH (ref 1.7–7.7)
Neutrophils Relative %: 80 %

## 2022-03-15 LAB — COMPREHENSIVE METABOLIC PANEL
ALT: 14 U/L (ref 0–44)
AST: 19 U/L (ref 15–41)
Albumin: 3.6 g/dL (ref 3.5–5.0)
Alkaline Phosphatase: 75 U/L (ref 38–126)
Anion gap: 9 (ref 5–15)
BUN: 18 mg/dL (ref 6–20)
CO2: 22 mmol/L (ref 22–32)
Calcium: 8.9 mg/dL (ref 8.9–10.3)
Chloride: 104 mmol/L (ref 98–111)
Creatinine, Ser: 0.76 mg/dL (ref 0.44–1.00)
GFR, Estimated: >60 mL/min (ref >60)
Glucose, Bld: 89 mg/dL (ref 70–99)
Potassium: 4.1 mmol/L (ref 3.5–5.1)
Sodium: 135 mmol/L (ref 135–145)
Total Bilirubin: 0.4 mg/dL (ref 0.3–1.2)
Total Protein: 7.3 g/dL (ref 6.5–8.1)

## 2022-03-15 LAB — CBC
HCT: 38.3 % (ref 36.0–46.0)
Hemoglobin: 12.3 g/dL (ref 12.0–15.0)
MCH: 29.8 pg (ref 26.0–34.0)
MCHC: 32.1 g/dL (ref 30.0–36.0)
MCV: 92.7 fL (ref 80.0–100.0)
Platelets: 435 10*3/uL — ABNORMAL HIGH (ref 150–400)
RBC: 4.13 MIL/uL (ref 3.87–5.11)
RDW: 15.7 % — ABNORMAL HIGH (ref 11.5–15.5)
WBC: 22.4 10*3/uL — ABNORMAL HIGH (ref 4.0–10.5)
nRBC: 0 % (ref 0.0–0.2)

## 2022-03-15 LAB — TROPONIN I (HIGH SENSITIVITY)
Troponin I (High Sensitivity): 3 ng/L (ref <18)
Troponin I (High Sensitivity): 4 ng/L (ref <18)

## 2022-03-15 LAB — RESP PANEL BY RT-PCR (RSV, FLU A&B, COVID)  RVPGX2
Influenza A by PCR: NEGATIVE
Influenza B by PCR: NEGATIVE
Resp Syncytial Virus by PCR: NEGATIVE
SARS Coronavirus 2 by RT PCR: NEGATIVE

## 2022-03-15 LAB — LIPASE, BLOOD: Lipase: 48 U/L (ref 11–51)

## 2022-03-15 MED ORDER — KETOROLAC TROMETHAMINE 15 MG/ML IJ SOLN
15.0000 mg | Freq: Once | INTRAMUSCULAR | Status: AC
  Administered 2022-03-15: 15 mg via INTRAMUSCULAR
  Filled 2022-03-15: qty 1

## 2022-03-15 MED ORDER — ONDANSETRON 8 MG PO TBDP
8.0000 mg | ORAL_TABLET | Freq: Once | ORAL | Status: AC
  Administered 2022-03-15: 8 mg via ORAL
  Filled 2022-03-15: qty 1

## 2022-03-15 NOTE — ED Provider Notes (Signed)
[facility] Provider Note   CSN: [id number] Arrival date & time: 03/15/22  [id number]     History  Chief Complaint  Patient presents with   Chest Pain    Linda Calderon is a 47 y.o. female.   Chest Pain    Patient with medical history of RA, COPD, tobacco dependence, bronchiolitis and pulmonary infiltrates presents to the emergency department due to chest pain.  This has been intermittent for the past month, it is across her chest and feels sharp, last for hours when it occurs.  Unable identify any provoking or alleviating features.  Happens randomly throughout the day and is not related with inspiration or expiration, coughing, exertion.  She does have an associated headache but denies any rhinorrhea, vision problems, abdominal pain, nausea or vomiting.  No history of ACS or PE, denies any recent travel or surgeries, no hemoptysis.  Denies any family history of cardiac disease.  Home Medications Prior to Admission medications   Medication Sig Start Date End Date Taking? Authorizing Provider  acetaminophen (TYLENOL 8 HOUR ARTHRITIS PAIN) 650 MG CR tablet Take 650 mg by mouth every 8 (eight) hours as needed for pain.    [provider]  Adalimumab 40 MG/0.4ML PNKT Inject into the skin. 12/12/21   [provider]  ALPRAZolam Duanne Moron) 1 MG tablet Take 1 mg by mouth 2 (two) times daily.    [provider]  amphetamine-dextroamphetamine (ADDERALL) 20 MG tablet Take 20 mg by mouth daily.    [provider]  buprenorphine (SUBUTEX) 8 MG SUBL SL tablet Place 8 mg under the tongue 3 (three) times daily. 08/22/21   [provider]  ibuprofen (ADVIL) 200 MG tablet Take 200 mg by mouth every 6 (six) hours as needed.    [provider]  Multiple Vitamins-Minerals (MULTIVITAMIN WITH MINERALS) tablet Take 1 tablet by mouth daily.    [provider]      Allergies    Morphine and related    Review  of Systems   Review of Systems  Cardiovascular:  Positive for chest pain.    Physical Exam Updated Vital Signs BP (!) 158/98   Pulse 78   Temp 97.6 F (36.4 C) (Oral)   Resp (!) 8   Ht '5\' 4"'$  (1.626 m)   Wt 48.5 kg   LMP 03/09/2011   SpO2 99%   BMI 18.37 kg/m  Physical Exam Vitals and nursing note reviewed. Exam conducted with a chaperone present.  Constitutional:      Appearance: Normal appearance.  HENT:     Head: Normocephalic and atraumatic.  Eyes:     General: No scleral icterus.       Right eye: No discharge.        Left eye: No discharge.     Extraocular Movements: Extraocular movements intact.     Pupils: Pupils are equal, round, and reactive to light.  Cardiovascular:     Rate and Rhythm: Normal rate and regular rhythm.     Pulses: Normal pulses.     Heart sounds: Normal heart sounds. No murmur heard.    No friction rub. No gallop.  Pulmonary:     Effort: Pulmonary effort is normal. No respiratory distress.     Breath sounds: Normal breath sounds.  Abdominal:     General: Abdomen is flat. Bowel sounds are normal. There is no distension.     Palpations: Abdomen is soft.     Tenderness: There  is no abdominal tenderness.  Skin:    General: Skin is warm and dry.     Coloration: Skin is not jaundiced.  Neurological:     Mental Status: She is alert. Mental status is at baseline.     Coordination: Coordination normal.     ED Results / Procedures / Treatments   Labs (all labs ordered are listed, but only abnormal results are displayed) Labs Reviewed  CBC - Abnormal; Notable for the following components:      Result Value   WBC 22.4 (*)    RDW 15.7 (*)    Platelets 435 (*)    All other components within normal limits  DIFFERENTIAL - Abnormal; Notable for the following components:   Neutro Abs 17.9 (*)    Monocytes Absolute 1.2 (*)    Abs Immature Granulocytes 0.23 (*)    All other components within normal limits  RESP PANEL BY RT-PCR (RSV, FLU A&B,  COVID)  RVPGX2  LIPASE, BLOOD  COMPREHENSIVE METABOLIC PANEL  TROPONIN I (HIGH SENSITIVITY)  TROPONIN I (HIGH SENSITIVITY)    EKG None  Radiology DG Chest 2 View  Result Date: 03/15/2022 CLINICAL DATA:  Chest pain, headache, nausea. EXAM: CHEST - 2 VIEW COMPARISON:  Chest x-ray dated 03/03/2021 and chest CT dated 06/03/2020 FINDINGS: Heart size and mediastinal contours are within normal limits. Lungs are hyperexpanded. Subtle small nodular densities within the RIGHT upper lobe, as also previously described on earlier chest CT of 06/03/2020. No new lung findings are seen. No confluence opacity to suggest a consolidating pneumonia. No pleural effusion or pneumothorax is seen. Osseous structures about the chest are unremarkable. IMPRESSION: 1. No active cardiopulmonary disease. No evidence of pneumonia or pulmonary edema. 2. Subtle small nodular densities within the RIGHT upper lobe, as also previously described on earlier chest CT of 06/03/2020. Recommend nonemergent chest CT to compare with the earlier chest CT. 3. Hyperexpanded lungs, consistent with COPD. Emphysematous changes were demonstrated on the earlier chest CT. Electronically Signed   By: Franki Cabot M.D.   On: 03/15/2022 09:16    Procedures Procedures    Medications Ordered in ED Medications  ketorolac (TORADOL) 15 MG/ML injection 15 mg (15 mg Intramuscular Given 03/15/22 1114)  ondansetron (ZOFRAN-ODT) disintegrating tablet 8 mg (8 mg Oral Given 03/15/22 1134)    ED Course/ Medical Decision Making/ A&P                             Medical Decision Making Amount and/or Complexity of Data Reviewed Labs: ordered. Radiology: ordered.  Risk Prescription drug management.   Patient presents to the emergency department due to chest pain.  Differential includes ACS, PE, pneumonia, pneumothorax, pericarditis, or myocarditis, arrhythmia.  EKG shows sinus rhythm, no specific ischemic changes.  On cardiac monitoring she is also  in sinus rhythm.  States she is has a leukocytosis of 22.4 with left shift, patient is on chronic neuromodulating therapy and is on a steroid taper right now so I think it may be artificially inflating the leukocytosis.  Her troponins are negative, CMP is without gross electrolyte derangement or AKI, COVID and flu are negative and lipase is within normal limits.  Chest x-ray shows findings consistent with her chronic lung disease, no acute process, pneumothorax, consolidation or widened mediastinum appreciated.  Do not think this is a dissection patient presentation going on for greater than a month, normal pulm extremity pulses.  Doubt ACS given no ischemic changes  on EKG and negative delta troponin.  I considered PE but patient is PERC negative, I will think is less likely.  She has not really been having viral symptoms this been ongoing for months making myocarditis or pericarditis less likely.  Additionally there is no EKG findings to suggest pericarditis.  Discussed return precautions with the patient but I do not think acute admission or additional workup at this time.  Her ACS score is very low at 1.         Final Clinical Impression(s) / ED Diagnoses Final diagnoses:  Nonspecific chest pain    Rx / DC Orders ED Discharge Orders     None         Sherrill Raring, Hershal Coria 03/15/22 Tommi Emery, MD 03/17/22 1432

## 2022-03-15 NOTE — ED Triage Notes (Signed)
Pt c/o mid chest pain, headache, nausea that started this morning, but has been intermittent over the last month.

## 2022-03-15 NOTE — ED Notes (Signed)
Pt c/o nausea. PA aware.

## 2022-03-15 NOTE — Discharge Instructions (Addendum)
You are seen today in the emergency department due to chest pain.  Your workup today was reassuring, your white count was slightly elevated today and you should follow-up with your doctor in the next few days for reevaluation to make sure you are it is not climbing, I think it is most likely from the steroids from your immunomodulatory therapy.  I would also follow-up with your pulmonologist due to abnormal chest x-ray findings, this appears to be chronic but he should continue seeing them for trending.  Return to the ED if you have new or concerning symptoms.

## 2022-03-27 ENCOUNTER — Ambulatory Visit: Payer: Medicaid Other

## 2022-03-27 ENCOUNTER — Ambulatory Visit (HOSPITAL_COMMUNITY): Payer: Medicaid Other

## 2022-04-03 DIAGNOSIS — M0579 Rheumatoid arthritis with rheumatoid factor of multiple sites without organ or systems involvement: Secondary | ICD-10-CM | POA: Insufficient documentation

## 2022-04-13 ENCOUNTER — Ambulatory Visit
Admission: RE | Admit: 2022-04-13 | Discharge: 2022-04-13 | Disposition: A | Discharge status: Home / Self Care | Payer: Medicaid Other | Source: Ambulatory Visit

## 2022-04-13 VITALS — BP 112/78 | HR 83 | Temp 97.3°F | Resp 16

## 2022-04-13 DIAGNOSIS — M069 Rheumatoid arthritis, unspecified: Secondary | ICD-10-CM

## 2022-04-13 MED ORDER — PREDNISONE 50 MG PO TABS: ORAL_TABLET | ORAL | 0 refills | Status: DC

## 2022-04-13 MED ORDER — METHYLPREDNISOLONE SODIUM SUCC 125 MG IJ SOLR
60.0000 mg | Freq: Once | INTRAMUSCULAR | Status: AC
  Administered 2022-04-13: 60 mg via INTRAMUSCULAR

## 2022-04-13 NOTE — Discharge Instructions (Addendum)
Take medication as prescribed. May apply ice to help with pain and swelling of the hands as needed. May take over-the-counter Tylenol arthritis strength as needed for pain or discomfort. Please follow-up with your rheumatologist regarding continued flares of your RA. Follow-up as needed.

## 2022-04-13 NOTE — ED Provider Notes (Signed)
[facility]    CSN: [id number] Arrival date & time: 04/13/22  1750      History   Chief Complaint Chief Complaint  Patient presents with   Hand Problem    Bad RA flareup in hands and right foot - Entered by patient    HPI Linda Calderon is a 47 y.o. female.   The history is provided by the patient.   Patient presents today for a several week history of rheumatoid arthritis flare. Reports both of her wrists are swollen as well as the joints in her hands and fingers.  She also complains of swelling of the joints in her right foot.  She states that she has knots in the right foot, making it difficult for her to walk.  It is painful to move her hands and fingers at all. Reports she is currently taking Humira and leflunomide for her rheumatoid arthritis. Reports her leflunomide was recently increased, however she does not like this is helping. She is working with [facility] to get her previous lab work from her previous rheumatologist.    Patient denies fevers or chills since the pain began.  No cough or sore throat.    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 Authorizing Provider  chlorthalidone (HYGROTON) 25 MG tablet 1 tablet in the morning with food Orally for 30 days 04/08/22  Yes [provider]  acetaminophen (TYLENOL 8 HOUR ARTHRITIS PAIN) 650 MG CR tablet Take 650 mg by mouth every 8 (eight) hours as needed for pain.    [provider]  Adalimumab 40 MG/0.4ML PNKT Inject into the skin. 12/12/21   [provider]  ALPRAZolam Duanne Moron) 1 MG tablet Take 1 mg by mouth 2 (two) times daily.    [provider]  amphetamine-dextroamphetamine (ADDERALL) 20 MG tablet Take 20 mg by mouth daily.    [provider]  buprenorphine (SUBUTEX) 8 MG SUBL SL tablet Place 8 mg under the tongue 3 (three) times daily. 08/22/21   [provider]  ibuprofen (ADVIL) 200 MG tablet Take 200 mg by mouth every 6 (six) hours as needed.    [provider]  Multiple Vitamins-Minerals (MULTIVITAMIN WITH MINERALS) tablet Take 1 tablet by mouth daily.    [provider]    Family History Family History  Problem Relation Age of Onset   Hypertension Mother    Hypertension Father    Cancer Father     Social History Social History   Tobacco Use   Smoking status: Every Day    Packs/day: 0.50    Years: 10.00    Additional pack years: 0.00    Total pack years: 5.00    Types: Cigarettes   Smokeless tobacco: Never  Vaping Use   Vaping Use: Never used  Substance Use Topics   Alcohol use: Yes    Comment: occasionally   Drug use: Yes    Frequency: 5.0 times per week    Types: Marijuana     Allergies   Morphine and related   Review of Systems Review of  Systems Per HPI  Physical Exam Triage Vital Signs ED Triage Vitals  Enc Vitals Group     BP 04/13/22 1812 112/78     Pulse Rate 04/13/22 1812 83     Resp 04/13/22 1812 16     Temp 04/13/22 1812 (!) 97.3 F (36.3 C)     Temp Source 04/13/22 1812 Oral     SpO2 04/13/22 1812 (!) 89 %     Weight --      Height --      Head Circumference --      Peak Flow --      Pain Score 04/13/22 1818 8     Pain Loc --      Pain Edu? --      Excl. in [place]? --    No data found.  Updated Vital Signs BP 112/78 (BP Location: Right Arm)   Pulse 83   Temp (!) 97.3 F (36.3 C) (Oral)   Resp 16   LMP 03/09/2011   SpO2 (!) 89%   Visual Acuity Right Eye Distance:   Left Eye Distance:   Bilateral Distance:    Right Eye Near:   Left Eye Near:    Bilateral Near:     Physical Exam Musculoskeletal:     Right wrist: Swelling (Deformity, tenderness and bony tenderness present. No swelling. Decreased range of motion. Normal sensation. Normal capillary refill.), deformity, tenderness and bony tenderness present. Normal range of motion. Normal pulse.     Left wrist: Swelling (Deformity, tenderness and bony tenderness present. No swelling. Decreased range of motion. Normal sensation. Normal capillary refill.), deformity, tenderness and bony tenderness present. Normal range of motion. Normal pulse.     Right hand: Swelling (Swelling, tenderness and bony tenderness present. No effusion. Normal range of motion. Normal pulse.), deformity, tenderness and bony tenderness present. Normal range of motion. Normal capillary refill. Normal pulse.     Left hand: Swelling (Swelling, tenderness and bony tenderness present. No effusion. Normal range of motion. Normal pulse.), deformity, tenderness and bony tenderness present. Normal range of motion. Normal capillary refill. Normal pulse.     Right foot: Normal range of motion and normal capillary refill. Swelling, deformity and bony tenderness present. Normal pulse.      Comments: Areas of swelling and deformity noted to the lateral aspect of the right foot into the first metatarsal.  Area is erythematous and warm to touch.  UC Treatments / Results  Labs (all labs ordered are listed, but only abnormal results are displayed) Labs Reviewed - No data to display  EKG   Radiology No results found.  Procedures Procedures (including critical care time)  Medications Ordered in UC Medications  methylPREDNISolone sodium succinate (SOLU-MEDROL) 125 mg/2 mL injection 60 mg (has no administration in time range)    Initial Impression / Assessment and Plan / UC Course  I have reviewed the triage vital signs and the nursing notes.  Pertinent labs & imaging results that were available during my care of the patient were reviewed by me and considered in my medical decision making (see chart for details).  The patient is well-appearing, she is in no acute distress, vital signs are stable.  Symptoms consistent with a flare of her RA.  Will treat with Solu-Medrol 60 mg IM in the office today along with prednisone 50 mg for the next 5 days.  Patient advised to follow-up with rheumatology for further evaluation and exacerbation of her symptoms.  Patient is in agreement with this plan of care and verbalizes understanding.  All questions were answered.  Patient stable for discharge.  Final Clinical Impressions(s) / UC Diagnoses   Final diagnoses:  Flare of rheumatoid arthritis [facility])     Discharge Instructions      Take medication as prescribed. May apply ice to help with pain and swelling of the hands as needed. May take over-the-counter Tylenol arthritis strength as needed for pain or discomfort. Please follow-up with your rheumatologist regarding continued flares of your RA. Follow-up as needed.       ED Prescriptions   None    PDMP not reviewed this encounter.   Tish Men, NP 04/13/22 1843

## 2022-04-13 NOTE — ED Triage Notes (Signed)
Pt reports both hands are swollen due to "RA flare up" x 1 day. Hands also feel itchy.

## 2022-04-26 ENCOUNTER — Other Ambulatory Visit: Payer: Self-pay

## 2022-04-26 ENCOUNTER — Encounter (HOSPITAL_COMMUNITY): Payer: Self-pay | Admitting: Emergency Medicine

## 2022-04-26 ENCOUNTER — Emergency Department (HOSPITAL_COMMUNITY)
Admission: EM | Admit: 2022-04-26 | Discharge: 2022-04-27 | Disposition: A | Discharge status: Home / Self Care | Payer: Medicaid Other | Attending: Emergency Medicine | Admitting: Emergency Medicine

## 2022-04-26 ENCOUNTER — Emergency Department (HOSPITAL_COMMUNITY): Payer: Medicaid Other

## 2022-04-26 DIAGNOSIS — M25512 Pain in left shoulder: Secondary | ICD-10-CM | POA: Diagnosis present

## 2022-04-26 DIAGNOSIS — F17200 Nicotine dependence, unspecified, uncomplicated: Secondary | ICD-10-CM | POA: Insufficient documentation

## 2022-04-26 DIAGNOSIS — I10 Essential (primary) hypertension: Secondary | ICD-10-CM | POA: Diagnosis not present

## 2022-04-26 LAB — CBC
HCT: 37.3 % (ref 36.0–46.0)
Hemoglobin: 12 g/dL (ref 12.0–15.0)
MCH: 29.3 pg (ref 26.0–34.0)
MCHC: 32.2 g/dL (ref 30.0–36.0)
MCV: 91 fL (ref 80.0–100.0)
Platelets: 484 10*3/uL — ABNORMAL HIGH (ref 150–400)
RBC: 4.1 MIL/uL (ref 3.87–5.11)
RDW: 15.9 % — ABNORMAL HIGH (ref 11.5–15.5)
WBC: 16.3 10*3/uL — ABNORMAL HIGH (ref 4.0–10.5)
nRBC: 0 % (ref 0.0–0.2)

## 2022-04-26 LAB — HCG, QUANTITATIVE, PREGNANCY: hCG, Beta Chain, Quant, S: 1 m[IU]/mL (ref <5)

## 2022-04-26 LAB — BASIC METABOLIC PANEL
Anion gap: 11 (ref 5–15)
BUN: 18 mg/dL (ref 6–20)
CO2: 23 mmol/L (ref 22–32)
Calcium: 8.5 mg/dL — ABNORMAL LOW (ref 8.9–10.3)
Chloride: 101 mmol/L (ref 98–111)
Creatinine, Ser: 1.14 mg/dL — ABNORMAL HIGH (ref 0.44–1.00)
GFR, Estimated: >60 mL/min (ref >60)
Glucose, Bld: 98 mg/dL (ref 70–99)
Potassium: 3.4 mmol/L — ABNORMAL LOW (ref 3.5–5.1)
Sodium: 135 mmol/L (ref 135–145)

## 2022-04-26 LAB — HEPATIC FUNCTION PANEL
ALT: 27 U/L (ref 0–44)
AST: 62 U/L — ABNORMAL HIGH (ref 15–41)
Albumin: 3.1 g/dL — ABNORMAL LOW (ref 3.5–5.0)
Alkaline Phosphatase: 133 U/L — ABNORMAL HIGH (ref 38–126)
Bilirubin, Direct: <0.1 mg/dL (ref 0.0–0.2)
Total Bilirubin: 0.3 mg/dL (ref 0.3–1.2)
Total Protein: 7.4 g/dL (ref 6.5–8.1)

## 2022-04-26 LAB — TROPONIN I (HIGH SENSITIVITY)
Troponin I (High Sensitivity): 5 ng/L (ref <18)
Troponin I (High Sensitivity): 4 ng/L (ref <18)

## 2022-04-26 MED ORDER — OXYCODONE HCL 5 MG PO TABS
5.0000 mg | ORAL_TABLET | Freq: Once | ORAL | Status: AC
  Administered 2022-04-26: 5 mg via ORAL
  Filled 2022-04-26: qty 1

## 2022-04-26 MED ORDER — ACETAMINOPHEN 500 MG PO TABS
1000.0000 mg | ORAL_TABLET | Freq: Once | ORAL | Status: AC
  Administered 2022-04-26: 1000 mg via ORAL
  Filled 2022-04-26: qty 2

## 2022-04-26 MED ORDER — IOHEXOL 350 MG/ML SOLN
75.0000 mL | Freq: Once | INTRAVENOUS | Status: AC | PRN
  Administered 2022-04-26: 75 mL via INTRAVENOUS

## 2022-04-26 MED ORDER — ALUM & MAG HYDROXIDE-SIMETH 200-200-20 MG/5ML PO SUSP
30.0000 mL | Freq: Once | ORAL | Status: AC
  Administered 2022-04-26: 30 mL via ORAL
  Filled 2022-04-26: qty 30

## 2022-04-26 MED ORDER — KETOROLAC TROMETHAMINE 15 MG/ML IJ SOLN
15.0000 mg | Freq: Once | INTRAMUSCULAR | Status: AC
  Administered 2022-04-26: 15 mg via INTRAVENOUS
  Filled 2022-04-26: qty 1

## 2022-04-26 MED ORDER — PANTOPRAZOLE SODIUM 40 MG IV SOLR
40.0000 mg | Freq: Once | INTRAVENOUS | Status: AC
  Administered 2022-04-26: 40 mg via INTRAVENOUS
  Filled 2022-04-26: qty 10

## 2022-04-26 NOTE — ED Provider Notes (Signed)
[place] EMERGENCY DEPARTMENT AT [facility] Provider Note   CSN: [id number] Arrival date & time: 04/26/22  1753     History {Add pertinent medical, surgical, social history, OB history to HPI:1} Chief Complaint  Patient presents with   Shoulder Pain    [name] is a 47 y.o. female with HTN, rheumatoid arthritis, PTSD, tobacco use, chronic low back pain who presents with atraumatic left shoulder pain posteriorly that started this morning.  Worse with inspiration and movement.  Denies any fall or trauma. Endorses a couple "dizzy spells" this week where she felt like she was going to pass out but did not. Also endorses fatigue/weakness. Denies any fever/chills, cough, chest pain, shortness of breath, abdominal pain, nausea any diarrhea constipation, lower extremity edema. Does smoke tobacco. No h/o DVT/PE, no recent hospitalizations/surgeries.    Shoulder Pain      Home Medications Prior to Admission medications   Medication Sig Start Date End Date Taking? Authorizing Provider  acetaminophen (TYLENOL 8 HOUR ARTHRITIS PAIN) 650 MG CR tablet Take 650 mg by mouth every 8 (eight) hours as needed for pain.    [provider]  Adalimumab 40 MG/0.4ML PNKT Inject into the skin. 12/12/21   [provider]  ALPRAZolam Prudy Feeler) 1 MG tablet Take 1 mg by mouth 2 (two) times daily.    [provider]  amphetamine-dextroamphetamine (ADDERALL) 20 MG tablet Take 20 mg by mouth daily.    [provider]  buprenorphine (SUBUTEX) 8 MG SUBL SL tablet Place 8 mg under the tongue 3 (three) times daily. 08/22/21   [provider]  chlorthalidone (HYGROTON) 25 MG tablet 1 tablet in the morning with food Orally for 30 days 04/08/22   [provider]  ibuprofen (ADVIL) 200 MG tablet Take 200 mg by mouth every 6 (six) hours as needed.    [provider]  Multiple Vitamins-Minerals (MULTIVITAMIN WITH MINERALS) tablet Take 1 tablet by mouth  daily.    [provider]  predniSONE (DELTASONE) 50 MG tablet Take 1 tablet daily with breakfast for the next 5 days. 04/13/22   Leath-Warren, [name], NP      Allergies    Morphine and related    Review of Systems   Review of Systems Review of systems Negative for f/c.  A 10 point review of systems was performed and is negative unless otherwise reported in HPI.  Physical Exam Updated Vital Signs BP (!) 146/108   Pulse 72   Temp 97.6 F (36.4 C)   Resp 18   Ht 5\' 4"  (1.626 m)   Wt 48.1 kg   LMP 03/09/2011   SpO2 99%   BMI 18.19 kg/m  Physical Exam General: Uncomfortable-appearing female, lying in bed.  HEENT: PERRLA, Sclera anicteric, MMM, trachea midline.  Cardiology: RRR, no murmurs/rubs/gallops. BL radial and DP pulses equal bilaterally.  Resp: Normal respiratory rate and effort. CTAB, no wheezes, rhonchi, crackles.  Abd: Soft, non-tender, non-distended. No rebound tenderness or guarding.  GU: Deferred. MSK: No gross abnormalities to the left shoulder, no edema or erythema, no wounds. No TTP of shoulder.  No peripheral edema or signs of trauma. Extremities without deformity or TTP. No cyanosis or clubbing. Skin: warm, dry.  Neuro: A&Ox4, CNs II-XII grossly intact. MAEs. Sensation grossly intact.  Psych: Normal mood and affect.   ED Results / Procedures / Treatments   Labs (all labs ordered are listed, but only abnormal results are displayed) Labs Reviewed  BASIC METABOLIC PANEL -  Abnormal; Notable for the following components:      Result Value   Potassium 3.4 (*)    Creatinine, Ser 1.14 (*)    Calcium 8.5 (*)    All other components within normal limits  CBC - Abnormal; Notable for the following components:   WBC 16.3 (*)    RDW 15.9 (*)    Platelets 484 (*)    All other components within normal limits  HCG, QUANTITATIVE, PREGNANCY  HEPATIC FUNCTION PANEL  TROPONIN I (HIGH SENSITIVITY)  TROPONIN I (HIGH SENSITIVITY)    EKG EKG  Interpretation  Date/Time:  Sunday April 26 2022 18:08:50 EDT Ventricular Rate:  82 PR Interval:  134 QRS Duration: 78 QT Interval:  344 QTC Calculation: 401 R Axis:   76 Text Interpretation: Normal sinus rhythm with sinus arrhythmia When compared with ECG of 15-Mar-2022 08:54, T wave inversion now evident in Inferior leads T wave inversion now evident in Anterolateral leads Confirmed by Vivi BarrackNaasz, Anyeli Hockenbury [phone]([id number]) on 04/26/2022 6:26:41 PM  Radiology DG Shoulder Left  Result Date: 04/26/2022 CLINICAL DATA:  Pain left shoulder EXAM: LEFT SHOULDER - 2+ VIEW COMPARISON:  None Available. FINDINGS: There is no evidence of fracture or dislocation. There is no evidence of arthropathy or other focal bone abnormality. No abnormal soft tissue calcifications are noted adjacent to the proximal humerus. IMPRESSION: No fracture or dislocation is seen. There are no abnormal soft tissue calcifications. Electronically Signed   By: Ernie AvenaPalani  Rathinasamy M.D.   On: 04/26/2022 20:50   DG Chest Port 1 View  Result Date: 04/26/2022 CLINICAL DATA:  [id number] Chest tightness [id number] EXAM: PORTABLE CHEST 1 VIEW COMPARISON:  March 15, 2022 FINDINGS: The cardiomediastinal silhouette is normal in contour. No pleural effusion. No pneumothorax. No acute pleuroparenchymal abnormality. Visualized abdomen is unremarkable. IMPRESSION: No acute cardiopulmonary abnormality. Electronically Signed   By: Meda KlinefelterStephanie  Peacock M.D.   On: 04/26/2022 19:43    Procedures Procedures  {Document cardiac monitor, telemetry assessment procedure when appropriate:1}  Medications Ordered in ED Medications  oxyCODONE (Oxy IR/ROXICODONE) immediate release tablet 5 mg (has no administration in time range)    ED Course/ Medical Decision Making/ A&P                          Medical Decision Making Amount and/or Complexity of Data Reviewed Labs: ordered. Decision-making details documented in ED Course. Radiology: ordered. Decision-making details  documented in ED Course.    This patient presents to the ED for concern of ***, this involves an extensive number of treatment options, and is a complaint that carries with it a high risk of complications and morbidity.  I considered the following differential and admission for this acute, potentially life threatening condition.   MDM:    DDX for chest pain includes but is not limited to:  ACS/arrhythmia,  PE, aortic dissection, PNA, PTX, esophogeal rupture, biliary disease, cardiac tamponade, pericarditis, GERD/PUD/gastritis, or musculoskeletal pain. Very low suspicion for ACS vs aortic dissection given presenting sx. Patient with smoking as risk factor for PE, she is very uncomfortable with posterior pleuritic pain, will get CT PE to r/o PE. *** No abnormalities to the shoulder itself, no edema/erythema to indicate septic arthritis. No trauma/falls, XRs negative for fracture/dislocation. Consider diaphragmatic pain radiating to L shoulder, does have some LUQ abd TTP very mild, has had reflux in the past, consider gastritis/GERD pain.    Clinical Course as of 04/26/22 2119  Sun Apr 26, 2022  2059 DG  Shoulder Left No fracture or dislocation is seen. There are no abnormal soft tissue calcifications.   [HN]  2059 DG Chest Port 1 View FINDINGS: The cardiomediastinal silhouette is normal in contour. No pleural effusion. No pneumothorax. No acute pleuroparenchymal abnormality. Visualized abdomen is unremarkable.  IMPRESSION: No acute cardiopulmonary abnormality.   [HN]  2059 WBC(!): 16.3 +leukocytosis [HN]  2100 Troponin I (High Sensitivity): 4 5-4 flat [HN]  2100 HCG, Beta Chain, Quant, S: 1 [HN]    Clinical Course User Index [HN] Loetta Rough, MD    Labs: I Ordered, and personally interpreted labs.  The pertinent results include:  ***  Imaging Studies ordered: I ordered imaging studies including *** I independently visualized and interpreted imaging. I agree with the  radiologist interpretation  Additional history obtained from ***.  External records from outside source obtained and reviewed including ***  Cardiac Monitoring: The patient was maintained on a cardiac monitor.  I personally viewed and interpreted the cardiac monitored which showed an underlying rhythm of: ***  Reevaluation: After the interventions noted above, I reevaluated the patient and found that they have :{resolved/improved/worsened:23923::"improved"}  Social Determinants of Health: ***  Disposition:  ***  Co morbidities that complicate the patient evaluation  Past Medical History:  Diagnosis Date   ADD (attention deficit disorder)    Arthritis    Chronic neck and back pain    Pneumonia    PTSD (post-traumatic stress disorder)    Radiculopathy    UTI (lower urinary tract infection)      Medicines Meds ordered this encounter  Medications   oxyCODONE (Oxy IR/ROXICODONE) immediate release tablet 5 mg    I have reviewed the patients home medicines and have made adjustments as needed  Problem List / ED Course: Problem List Items Addressed This Visit   None        {Document critical care time when appropriate:1} {Document review of labs and clinical decision tools ie heart score, Chads2Vasc2 etc:1}  {Document your independent review of radiology images, and any outside records:1} {Document your discussion with family members, caretakers, and with consultants:1} {Document social determinants of health affecting pt's care:1} {Document your decision making why or why not admission, treatments were needed:1}  This note was created using dictation software, which may contain spelling or grammatical errors.

## 2022-04-26 NOTE — ED Notes (Signed)
Patient transported to CT 

## 2022-04-26 NOTE — ED Triage Notes (Signed)
Left shoulder pain started this morning and hurts to take a deep breath. Denies any injury.

## 2022-04-26 NOTE — ED Notes (Signed)
Patient returned from CT

## 2022-04-27 MED ORDER — FAMOTIDINE 20 MG PO TABS: 20.0000 mg | ORAL_TABLET | Freq: Two times a day (BID) | ORAL | 0 refills | Status: DC

## 2022-04-27 NOTE — Discharge Instructions (Addendum)
Thank you for coming to [facility] Emergency Department. You were seen for shoulder pain. We did an exam, labs, and imaging, and these showed no acute findings. It is possible you were having muscular pain or that you had referred pain from reflux/gastritis. You can take pepcid twice per day for 30 days. You can take 650mg  tylenol (acetaminophen) every 4-6 hours as needed as well. Please avoid alcohol, tobacco, caffeine, NSAIDs which may aggravate those symptoms.   Your CT scan did demonstrate a few nodules in your lungs. These can be followed by your primary care physician.   Please follow up with your primary care provider within 1 week.   Do not hesitate to return to the ED or call 911 if you experience: -Worsening symptoms -Palpitations, chest pain -Shortness of breath -Lightheadedness, passing out -Fevers/chills -Anything else that concerns you

## 2022-05-10 ENCOUNTER — Ambulatory Visit
Admission: RE | Admit: 2022-05-10 | Discharge: 2022-05-10 | Disposition: A | Discharge status: Home / Self Care | Payer: Medicaid Other | Source: Ambulatory Visit | Attending: Physician Assistant | Admitting: Physician Assistant

## 2022-05-10 ENCOUNTER — Other Ambulatory Visit: Payer: Self-pay

## 2022-05-10 VITALS — BP 122/90 | HR 80 | Temp 97.4°F | Resp 20

## 2022-05-10 DIAGNOSIS — M069 Rheumatoid arthritis, unspecified: Secondary | ICD-10-CM

## 2022-05-10 DIAGNOSIS — M2550 Pain in unspecified joint: Secondary | ICD-10-CM

## 2022-05-10 MED ORDER — METHYLPREDNISOLONE SODIUM SUCC 125 MG IJ SOLR
60.0000 mg | Freq: Once | INTRAMUSCULAR | Status: AC
  Administered 2022-05-10: 60 mg via INTRAMUSCULAR

## 2022-05-10 MED ORDER — PREDNISONE 10 MG (21) PO TBPK: ORAL_TABLET | Freq: Every day | ORAL | 0 refills | Status: DC

## 2022-05-10 NOTE — ED Provider Notes (Signed)
[facility]    CSN: [id number] Arrival date & time: 05/10/22  1401      History   Chief Complaint Chief Complaint  Patient presents with   Joint Pain    RA FLARE - Entered by patient    HPI Linda Calderon is a 47 y.o. female.   Patient presents today with a 2-day history of polyarthralgia.  She has a history of rheumatoid arthritis and is currently between rheumatologist.  She has an appoint with a new rheumatologist on 06/08/2022.  Over the past several days she has had increasing pain and swelling in the joints in her hands, knees, feet, ankles with associated morning stiffness.  She reports being diagnosed approximately 5 years ago and does have positive rheumatoid factor.  She has been taking over-the-counter Tylenol and ibuprofen without improvement of symptoms.  She denies any fever, nausea, vomiting.  Denies any known injury.  She has previously been prescribed Humira but discontinued this medication approximately 3 months ago as she felt it was causing a lot of side effects.  She is not currently on immune modulating medication.  She reports often having to come for prednisone treatment of flares.  She was last treated 04/13/2022 with 50 mg of prednisone for 5 days following Solu-Medrol injection.  She reports that her symptoms resolved with this medication only to recur again after several weeks or months.  She denies any history of diabetes, glaucoma.  Pain is rated 9 on a 0-10 pain scale, described as aching, no aggravating or alleviating factors identified.    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   Smoker 07/31/2019   Chronic low back pain 09/12/2012    Past Surgical History:  Procedure Laterality Date   ABDOMINAL HYSTERECTOMY     TUBAL LIGATION     VIDEO BRONCHOSCOPY N/A 08/18/2019   Procedure: FLEXIBLE BRONCHOSCOPY WITH BRONCHOALVEOLAR LAVAGE;  Surgeon: Kalman Shan, MD;  Location: [facility];  Service: Cardiopulmonary;  Laterality: N/A;    OB History     Gravida  8   Para  1   Term  1   Preterm      AB  7   Living  1      SAB  7   IAB      Ectopic      Multiple      Live Births               Home Medications    Prior to Admission medications   Medication Sig Start Date End Date Taking? Authorizing Provider  predniSONE (STERAPRED UNI-PAK 21 TAB) 10 MG (21) TBPK tablet Take by mouth daily. Take 6 tabs by mouth daily  for 2 days, then 5 tabs for 2 days, then 4 tabs for 2 days, then 3 tabs for 2 days, 2 tabs for 2 days, then 1 tab by mouth daily for 2 days  05/10/22  Yes Brittin Belnap, Noberto Retort, PA-C  acetaminophen (TYLENOL 8 HOUR ARTHRITIS PAIN) 650 MG CR tablet Take 650 mg by mouth every 8 (eight) hours as needed for pain.    [provider]  Adalimumab 40 MG/0.4ML PNKT Inject into the skin. 12/12/21   [provider]  ALPRAZolam Prudy Feeler) 1 MG tablet Take 1 mg by mouth 2 (two) times daily.    [provider]  amphetamine-dextroamphetamine (ADDERALL) 20 MG tablet Take 20 mg by mouth daily.    [provider]  buprenorphine (SUBUTEX) 8 MG SUBL SL tablet Place 8 mg under the tongue 3 (three) times daily. 08/22/21   [provider]  chlorthalidone (HYGROTON) 25 MG tablet 1 tablet in the morning with food Orally for 30 days 04/08/22   [provider]  famotidine (PEPCID) 20 MG tablet Take 1  tablet (20 mg total) by mouth 2 (two) times daily. 04/27/22   Loetta Rough, MD  ibuprofen (ADVIL) 200 MG tablet Take 200 mg by mouth every 6 (six) hours as needed.    [provider]  Multiple Vitamins-Minerals (MULTIVITAMIN WITH MINERALS) tablet Take 1 tablet by mouth daily.    [provider]    Family History Family History  Problem Relation Age of Onset   Hypertension Mother    Hypertension Father    Cancer Father     Social History Social History   Tobacco Use   Smoking status: Every Day    Packs/day: 0.50    Years: 10.00    Additional pack years: 0.00    Total pack years: 5.00    Types: Cigarettes   Smokeless tobacco: Never  Vaping Use   Vaping Use: Never used  Substance Use Topics   Alcohol use: Yes    Comment: occasionally   Drug use: Yes    Frequency: 5.0 times per week    Types: Marijuana     Allergies   Morphine and related   Review of Systems Review of Systems  Constitutional:  Positive for activity change. Negative for appetite change, fatigue and fever.  Gastrointestinal:  Negative for abdominal pain, diarrhea, nausea and vomiting.  Musculoskeletal:  Positive for arthralgias. Negative for myalgias.  Neurological:  Negative for weakness and numbness.     Physical Exam Triage Vital Signs ED Triage Vitals [05/10/22 1417]  Enc Vitals Group     BP (!) 122/90     Pulse Rate 80     Resp 20     Temp (!) 97.4 F (36.3 C)     Temp Source Oral     SpO2 93 %     Weight      Height      Head Circumference      Peak Flow      Pain Score 9     Pain Loc      Pain Edu?      Excl. in GC?    No data found.  Updated Vital Signs BP (!) 122/90 (BP Location: Right Arm)   Pulse 80   Temp (!) 97.4 F (36.3 C) (Oral)   Resp 20   LMP 03/09/2011   SpO2 93%   Visual Acuity Right Eye Distance:   Left Eye Distance:   Bilateral Distance:    Right Eye Near:   Left Eye Near:    Bilateral Near:     Physical Exam Vitals reviewed.   Constitutional:      General: She is awake. She is not in  acute distress.    Appearance: Normal appearance. She is well-developed. She is not ill-appearing.     Comments: Very pleasant female appears stated age in no acute distress sitting comfortably in exam room  HENT:     Head: Normocephalic and atraumatic.  Cardiovascular:     Rate and Rhythm: Normal rate and regular rhythm.     Heart sounds: Normal heart sounds, S1 normal and S2 normal. No murmur heard. Pulmonary:     Effort: Pulmonary effort is normal.     Breath sounds: Normal breath sounds. No wheezing, rhonchi or rales.     Comments: Clear to auscultation bilaterally Abdominal:     Palpations: Abdomen is soft.     Tenderness: There is no abdominal tenderness.  Musculoskeletal:     Comments: Hands: Swelling of multiple interphalangeal joints with associated tenderness worse on right.  Right has 50% fist formation with left having 80% fist formation.  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  Ankles: Swelling with decreased range of motion noted bilateral ankles.  No deformity noted.  Psychiatric:        Behavior: Behavior is cooperative.      UC Treatments / Results  Labs (all labs ordered are listed, but only abnormal results are displayed) Labs Reviewed - No data to display  EKG   Radiology No results found.  Procedures Procedures (including critical care time)  Medications Ordered in UC Medications  methylPREDNISolone sodium succinate (SOLU-MEDROL) 125 mg/2 mL injection 60 mg (has no administration in time range)    Initial Impression / Assessment and Plan / UC Course  I have reviewed the triage vital signs and the nursing notes.  Pertinent labs & imaging results that were available during my care of the patient were reviewed by me and considered in my medical decision making (see chart for details).     Patient is well-appearing, afebrile, nontoxic, nontachycardic.  Symptoms are  consistent with rheumatoid arthritis flare.  Will treat with systemic steroids.  She was given 60 mg of Solu-Medrol in clinic and will start prednisone taper tomorrow (05/11/2022).  Discussed that she is not to take NSAIDs with this medication.  It is very important that she follows up with rheumatology for disease modifying treatment and patient expressed understanding and intends to keep her scheduled appointment next month.  We discussed that regular use of steroids can have long-term effects and that we should try to limit the systemic steroids is much as possible.  She can use over-the-counter medications including Tylenol for pain relief.  Discussed that if she has any worsening or changing symptoms she should return for reevaluation.  Strict return precautions given.  Work excuse note provided.  Final Clinical Impressions(s) / UC Diagnoses   Final diagnoses:  Flare of rheumatoid arthritis  Polyarthralgia     Discharge Instructions      We gave an injection of steroids today.  Please start prednisone tomorrow (05/11/2022).  Do not take NSAIDs with this medication including aspirin, ibuprofen/Advil, naproxen/Aleve.  You can use Tylenol/acetaminophen for additional symptom relief.  It is very important that you follow-up with rheumatology as scheduled next month to determine ongoing treatment plan as long-term use of steroids is unsafe.  If you develop any changing or worsening symptoms including increasing pain, fever, nausea, vomiting you should be seen immediately.     ED Prescriptions     Medication Sig Dispense Auth. Provider   predniSONE (STERAPRED UNI-PAK 21 TAB) 10 MG (21) TBPK tablet Take by mouth  daily. Take 6 tabs by mouth daily  for 2 days, then 5 tabs for 2 days, then 4 tabs for 2 days, then 3 tabs for 2 days, 2 tabs for 2 days, then 1 tab by mouth daily for 2 days 42 tablet Mathew Postiglione K, PA-C      PDMP not reviewed this encounter.   Jeani Hawking, PA-C 05/10/22 1438

## 2022-05-10 NOTE — Discharge Instructions (Signed)
We gave an injection of steroids today.  Please start prednisone tomorrow (05/11/2022).  Do not take NSAIDs with this medication including aspirin, ibuprofen/Advil, naproxen/Aleve.  You can use Tylenol/acetaminophen for additional symptom relief.  It is very important that you follow-up with rheumatology as scheduled next month to determine ongoing treatment plan as long-term use of steroids is unsafe.  If you develop any changing or worsening symptoms including increasing pain, fever, nausea, vomiting you should be seen immediately.

## 2022-05-10 NOTE — ED Triage Notes (Signed)
Pt reports RA flare-up since yesterday. Pt reports bilateral hands, knee, and feet started swelling and pain yesterday while at work.

## 2022-05-27 ENCOUNTER — Encounter: Payer: Self-pay | Admitting: Family Medicine

## 2022-05-27 ENCOUNTER — Ambulatory Visit: Payer: Medicaid Other | Admitting: Family Medicine

## 2022-05-27 VITALS — BP 122/90 | HR 95 | Temp 97.6°F | Ht 64.0 in | Wt 109.2 lb

## 2022-05-27 DIAGNOSIS — Z1231 Encounter for screening mammogram for malignant neoplasm of breast: Secondary | ICD-10-CM

## 2022-05-27 DIAGNOSIS — I10 Essential (primary) hypertension: Secondary | ICD-10-CM

## 2022-05-27 DIAGNOSIS — M05711 Rheumatoid arthritis with rheumatoid factor of right shoulder without organ or systems involvement: Secondary | ICD-10-CM | POA: Diagnosis not present

## 2022-05-27 DIAGNOSIS — Z1211 Encounter for screening for malignant neoplasm of colon: Secondary | ICD-10-CM

## 2022-05-27 DIAGNOSIS — M05712 Rheumatoid arthritis with rheumatoid factor of left shoulder without organ or systems involvement: Secondary | ICD-10-CM | POA: Diagnosis not present

## 2022-05-27 DIAGNOSIS — F17210 Nicotine dependence, cigarettes, uncomplicated: Secondary | ICD-10-CM

## 2022-05-27 MED ORDER — BUPROPION HCL ER (SR) 150 MG PO TB12: 150.0000 mg | ORAL_TABLET | Freq: Two times a day (BID) | ORAL | 0 refills | Status: DC

## 2022-05-27 NOTE — Assessment & Plan Note (Signed)
BP today 122/90. Currently taking Chlorthalidone 25mg  daily. Will obtain fasting labs and increase to 50mg  daily if BP remains elevated with home readings. She will take home readings in AM and bring to physical in 1 week. Return to office sooner if BP remains >180/120 or <100/60. Seek medical care for chest pain, palpitations, shortness of breath, recurrent headaches, or vision changes.

## 2022-05-27 NOTE — Assessment & Plan Note (Signed)
Chronic. Uncontrolled and unmedicated. She was taking Humira shots but is not currently. Has had recent steroid injection and is currently taking prednisone oral taper. Referral placed to rheumatology.

## 2022-05-27 NOTE — Assessment & Plan Note (Signed)
3-5 minute discussion regarding the harms of tobacco use, the benefits of cessation, and methods of cessation. Discussed that there are medication options to help with cessation. Provided printed education on steps to quit smoking. Patient is willing to try a medication to help. Start Wellbutrin 150mg  daily for 3 days then increase to BID with target quit date 06/10/2022.

## 2022-05-27 NOTE — Progress Notes (Signed)
New Patient Office Visit  Subjective    Patient ID: Linda Calderon, female    DOB: 1975-02-18  Age: 47 y.o. MRN: [id number]  CC:  Chief Complaint  Patient presents with   Establish Care    HPI [name] presents to establish care. Oriented to practice routines and expectations. PMH includes HTN, RA, anxiety, depression, and allergies. She has not been to rheumatology in 9 months and is currently not taking any medications for her RA, has been seeing urgent care as recent as 05/10/2022 and gets steroid injections and prednisone taper, she is currently taking prednisone.  Breast CA screening: Mammogram status: Ordered today. Pt provided with contact info and advised to call to schedule appt.  Cervical CA screening: patient does not recall when last pap was precancerous cells on cervix, has had hysterectomy, no oopherectomy Colon CA screening: due, ordered cologuard Tobacco: smoker  (0.5 ppd x 15 yrs) STI: declines Vaccines:  Tdap today  Rheumatoid Arthritis: Patient complains of rheumatoid arthritis.  Symptoms have been present for 5 years. Onset was gradual. Symptoms include joint pain, joint swelling, numbness of hands, sleep difficulties, and weakness of hands and are of severe severity. Patient denies eye symptoms. Symptoms are made worse by: overuse.  Symptoms are helped by nothing.  Associated symptoms include depression, fatigue, joint pain, morning stiffness, and muscle weakness. Patient denies associated fevers, memory loss, new headache, oral ulcers, palpitations, polydypsia, polyuria, rashes/photosensitive, and seizures.  Overall disease activity:  worse. Limitation on activities include difficulty with getting dressed.   HYPERTENSION without Chronic Kidney Disease Hypertension status: better  Satisfied with current treatment? yes Duration of hypertension: chronic BP monitoring frequency:  daily BP range: 120-140/80-100 BP medication side effects:  no Medication  compliance: excellent compliance Previous BP meds: chlorthalidone Aspirin: no Recurrent headaches: no Visual changes: no Palpitations: no Dyspnea: no Chest pain: no Lower extremity edema: no Dizzy/lightheaded: no]  Outpatient Encounter Medications as of 05/27/2022  Medication Sig   buPROPion (WELLBUTRIN SR) 150 MG 12 hr tablet Take 1 tablet (150 mg total) by mouth 2 (two) times daily.   chlorthalidone (HYGROTON) 25 MG tablet Take 25 mg by mouth daily.   famotidine (PEPCID) 20 MG tablet Take 20 mg by mouth 2 (two) times daily.   Multiple Vitamins-Minerals (MULTIVITAMIN WITH MINERALS) tablet Take 1 tablet by mouth daily.   [DISCONTINUED] chlorthalidone (HYGROTON) 25 MG tablet 1 tablet in the morning with food Orally for 30 days   [DISCONTINUED] famotidine (PEPCID) 20 MG tablet Take 1 tablet (20 mg total) by mouth 2 (two) times daily.   [DISCONTINUED] acetaminophen (TYLENOL 8 HOUR ARTHRITIS PAIN) 650 MG CR tablet Take 650 mg by mouth every 8 (eight) hours as needed for pain. (Patient not taking: Reported on 05/27/2022)   [DISCONTINUED] Adalimumab 40 MG/0.4ML PNKT Inject into the skin. (Patient not taking: Reported on 05/27/2022)   [DISCONTINUED] ALPRAZolam (XANAX) 1 MG tablet Take 1 mg by mouth 2 (two) times daily. (Patient not taking: Reported on 05/27/2022)   [DISCONTINUED] amphetamine-dextroamphetamine (ADDERALL) 20 MG tablet Take 20 mg by mouth daily. (Patient not taking: Reported on 05/27/2022)   [DISCONTINUED] buprenorphine (SUBUTEX) 8 MG SUBL SL tablet Place 8 mg under the tongue 3 (three) times daily. (Patient not taking: Reported on 05/27/2022)   [DISCONTINUED] ibuprofen (ADVIL) 200 MG tablet Take 200 mg by mouth every 6 (six) hours as needed. (Patient not taking: Reported on 05/27/2022)   [DISCONTINUED] predniSONE (STERAPRED UNI-PAK 21 TAB) 10 MG (21) TBPK tablet Take by  mouth daily. Take 6 tabs by mouth daily  for 2 days, then 5 tabs for 2 days, then 4 tabs for 2 days, then 3 tabs for 2 days, 2  tabs for 2 days, then 1 tab by mouth daily for 2 days (Patient not taking: Reported on 05/27/2022)   No facility-administered encounter medications on file as of 05/27/2022.    Past Medical History:  Diagnosis Date   ADD (attention deficit disorder)    Arthritis    Chronic neck and back pain    Pneumonia    PTSD (post-traumatic stress disorder)    Radiculopathy    UTI (lower urinary tract infection)     Past Surgical History:  Procedure Laterality Date   ABDOMINAL HYSTERECTOMY     TUBAL LIGATION     VIDEO BRONCHOSCOPY N/A 08/18/2019   Procedure: FLEXIBLE BRONCHOSCOPY WITH BRONCHOALVEOLAR LAVAGE;  Surgeon: Kalman Shan, MD;  Location: [facility];  Service: Cardiopulmonary;  Laterality: N/A;    Family History  Problem Relation Age of Onset   Hypertension Mother    Hypertension Father    Cancer Father     Social History   Socioeconomic History   Marital status: Single    Spouse name: Not on file   Number of children: Not on file   Years of education: Not on file   Highest education level: Not on file  Occupational History   Not on file  Tobacco Use   Smoking status: Every Day    Packs/day: 0.50    Years: 10.00    Additional pack years: 0.00    Total pack years: 5.00    Types: Cigarettes   Smokeless tobacco: Never  Vaping Use   Vaping Use: Never used  Substance and Sexual Activity   Alcohol use: Yes    Comment: occasionally   Drug use: Yes    Frequency: 5.0 times per week    Types: Marijuana   Sexual activity: Yes    Birth control/protection: Surgical  Other Topics Concern   Not on file  Social History Narrative   Not on file   Social Determinants of Health   Financial Resource Strain: Not on file  Food Insecurity: Not on file  Transportation Needs: Not on file  Physical Activity: Not on file  Stress: Not on file  Social Connections: Not on file  Intimate Partner Violence: Not on file    Review of Systems  Gastrointestinal: Negative.    Musculoskeletal:  Positive for joint pain.  All other systems reviewed and are negative.       Objective    BP (!) 122/90   Pulse 95   Temp 97.6 F (36.4 C) (Oral)   Ht 5\' 4"  (1.626 m)   Wt 109 lb 3.2 oz (49.5 kg)   LMP 03/09/2011   SpO2 95%   BMI 18.74 kg/m   Physical Exam Vitals and nursing note reviewed.  Constitutional:      Appearance: Normal appearance. She is normal weight.  HENT:     Head: Normocephalic and atraumatic.  Cardiovascular:     Rate and Rhythm: Normal rate and regular rhythm.     Pulses: Normal pulses.     Heart sounds: Normal heart sounds.  Pulmonary:     Effort: Pulmonary effort is normal.     Breath sounds: Normal breath sounds.  Musculoskeletal:     Right wrist: Swelling and tenderness present.     Left wrist: Swelling and tenderness present.     Right hand: Swelling  present.     Left hand: Swelling present.  Skin:    General: Skin is warm and dry.  Neurological:     General: No focal deficit present.     Mental Status: She is alert and oriented to person, place, and time. Mental status is at baseline.  Psychiatric:        Mood and Affect: Mood normal.        Behavior: Behavior normal.        Thought Content: Thought content normal.        Judgment: Judgment normal.     Last CBC Lab Results  Component Value Date   WBC 16.3 (H) 04/26/2022   HGB 12.0 04/26/2022   HCT 37.3 04/26/2022   MCV 91.0 04/26/2022   MCH 29.3 04/26/2022   RDW 15.9 (H) 04/26/2022   PLT 484 (H) 04/26/2022   Last metabolic panel Lab Results  Component Value Date   GLUCOSE 98 04/26/2022   NA 135 04/26/2022   K 3.4 (L) 04/26/2022   CL 101 04/26/2022   CO2 23 04/26/2022   BUN 18 04/26/2022   CREATININE 1.14 (H) 04/26/2022   GFRNONAA >60 04/26/2022   CALCIUM 8.5 (L) 04/26/2022   PROT 7.4 04/26/2022   ALBUMIN 3.1 (L) 04/26/2022   BILITOT 0.3 04/26/2022   ALKPHOS 133 (H) 04/26/2022   AST 62 (H) 04/26/2022   ALT 27 04/26/2022   ANIONGAP 11 04/26/2022    Last lipids Lab Results  Component Value Date   CHOL 145 03/04/2006   HDL 39 (L) 03/04/2006   LDLCALC 91 03/04/2006   TRIG 77 03/04/2006   CHOLHDL 3.7 Ratio 03/04/2006   Last hemoglobin A1c No results found for: "HGBA1C" Last thyroid functions Lab Results  Component Value Date   TSH 1.91 07/31/2019   Last vitamin D No results found for: "25OHVITD2", "25OHVITD3", "VD25OH" Last vitamin B12 and Folate No results found for: "VITAMINB12", "FOLATE"      Assessment & Plan:   Problem List Items Addressed This Visit     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 Uncontrolled and unmedicated. She was taking Humira shots but is not currently. Has had recent steroid injection and is currently taking prednisone oral taper. Referral placed to rheumatology.      Relevant Orders   Ambulatory referral to Rheumatology   Hypertension    BP today 122/90. Currently taking Chlorthalidone 25mg  daily. Will obtain fasting labs and increase to 50mg  daily if BP remains elevated with home readings. She will take home readings in AM and bring to physical in 1 week. Return to office sooner if BP remains >180/120 or <100/60. Seek medical care for chest pain, palpitations, shortness of breath, recurrent headaches, or vision changes.      Relevant Medications   chlorthalidone (HYGROTON) 25 MG tablet   Other Relevant Orders   COMPLETE METABOLIC PANEL WITH GFR   CBC with Differential/Platelet   Lipid panel   Cigarette nicotine dependence without complication    3-5 minute discussion regarding the harms of tobacco use, the benefits of cessation, and methods of cessation. Discussed that there are medication options to help with cessation. Provided printed education on steps to quit smoking. Patient is willing to try a medication to help. Start Wellbutrin 150mg  daily for 3 days then increase to BID with target quit date 06/10/2022.       Other  Visit Diagnoses     Colon cancer screening  Relevant Orders   Cologuard   Encounter for screening mammogram for malignant neoplasm of breast       Relevant Orders   MM DIGITAL SCREENING BILATERAL       Return for annual physical, labs next wednesday.   Park Meo, FNP

## 2022-05-27 NOTE — Patient Instructions (Signed)
It was great to meet you today and I'm excited to have you join the [facility] practice. I hope you had a positive experience today! If you feel so inclined, please feel free to recommend our practice to friends and family. Jiraiya Mcewan, FNP-C  

## 2022-05-28 ENCOUNTER — Encounter: Payer: Self-pay | Admitting: Family Medicine

## 2022-06-02 ENCOUNTER — Ambulatory Visit
Admission: RE | Admit: 2022-06-02 | Discharge: 2022-06-02 | Disposition: A | Discharge status: Home / Self Care | Payer: Medicaid Other | Source: Ambulatory Visit | Attending: Nurse Practitioner | Admitting: Nurse Practitioner

## 2022-06-02 VITALS — BP 113/92 | HR 79 | Temp 97.7°F | Resp 15

## 2022-06-02 DIAGNOSIS — M25561 Pain in right knee: Secondary | ICD-10-CM | POA: Diagnosis not present

## 2022-06-02 DIAGNOSIS — M069 Rheumatoid arthritis, unspecified: Secondary | ICD-10-CM

## 2022-06-02 MED ORDER — METHYLPREDNISOLONE SODIUM SUCC 125 MG IJ SOLR
60.0000 mg | Freq: Once | INTRAMUSCULAR | Status: AC
  Administered 2022-06-02: 60 mg via INTRAMUSCULAR

## 2022-06-02 MED ORDER — PREDNISONE 10 MG PO TABS: ORAL_TABLET | ORAL | 0 refills | Status: DC

## 2022-06-02 NOTE — Discharge Instructions (Addendum)
We have given you a steroid shot today.  Please start the oral prednisone tomorrow.  Follow-up with rheumatology as planned.

## 2022-06-02 NOTE — ED Provider Notes (Signed)
[facility]    CSN: [id number] Arrival date & time: 06/02/22  1432      History   Chief Complaint Chief Complaint  Patient presents with  . Knee Pain    Bad RA flareup - Entered by patient    HPI Linda Calderon is a 47 y.o. female.     Patient presents today with a 2-day history of polyarthralgia.  She has a history of rheumatoid arthritis and is currently between rheumatologist.  She has an appoint with a new rheumatologist on 06/08/2022.  Over the past several days she has had increasing pain and swelling in the joints in her hands, knees, feet, ankles with associated morning stiffness.  She reports being diagnosed approximately 5 years ago and does have positive rheumatoid factor.  She has been taking over-the-counter Tylenol and ibuprofen without improvement of symptoms.  She denies any fever, nausea, vomiting.  Denies any known injury.  She has previously been prescribed Humira but discontinued this medication approximately 3 months ago as she felt it was causing a lot of side effects.  She is not currently on immune modulating medication.  She reports often having to come for prednisone treatment of flares.  She was last treated 04/13/2022 with 50 mg of prednisone for 5 days following Solu-Medrol injection.  She reports that her symptoms resolved with this medication only to recur again after several weeks or months.  She denies any history of diabetes, glaucoma.  Pain is rated 9 on a 0-10 pain scale, described as aching, no aggravating or alleviating factors identified   Past Medical History:  Diagnosis Date  . ADD (attention deficit disorder)   . Arthritis   . Chronic neck and back pain   . Pneumonia   . PTSD (post-traumatic stress disorder)   . Radiculopathy   . UTI (lower urinary tract infection)     Patient Active Problem List   Diagnosis Date Noted  . Cigarette nicotine dependence without complication 05/27/2022  . Hypertension 04/26/2022  . Rheumatoid  arthritis with rheumatoid factor of multiple sites without organ or systems involvement (HCC) 04/03/2022  . 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)   . Pulmonary infiltrates 07/31/2019  . Bronchiolitis 07/31/2019  . DDD (degenerative disc disease), lumbar 07/31/2019  . PTSD (post-traumatic stress disorder) 07/31/2019  . 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 Smoker 07/31/2019  . Rheumatoid arthritis flare (HCC) 11/25/2017  . Dizziness 04/05/2017  . Encounter for long-term (current) use of high-risk medication 04/05/2017  . Seropositive rheumatoid arthritis (HCC) 03/24/2017  . Chronic low back pain 09/12/2012  . Sciatica of right side 09/12/2012    Past Surgical History:  Procedure Laterality Date  . ABDOMINAL HYSTERECTOMY    . TUBAL LIGATION    . VIDEO BRONCHOSCOPY N/A 08/18/2019   Procedure: FLEXIBLE BRONCHOSCOPY WITH BRONCHOALVEOLAR LAVAGE;  Surgeon: Kalman Shan, MD;  Location: [facility];  Service: Cardiopulmonary;  Laterality: N/A;    OB History     Gravida  8   Para  1   Term  1   Preterm      AB  7   Living  1      SAB  7   IAB      Ectopic      Multiple      Live Births               Home Medications    Prior to Admission medications   Medication Sig  Start Date End Date Taking? Authorizing Provider  predniSONE (DELTASONE) 10 MG tablet Take 6 tablets by mouth daily for 2 days, then reduce by 1 tablet every 2 days until gone 06/02/22  Yes Valentino Nose, NP  buPROPion [facility] SR) 150 MG 12 hr tablet Take 1 tablet (150 mg total) by mouth 2 (two) times daily. 05/27/22   Park Meo, FNP  chlorthalidone (HYGROTON) 25 MG tablet Take 25 mg by mouth daily.    [provider]  famotidine (PEPCID) 20 MG tablet Take 20 mg by mouth 2 (two) times daily.    [provider]  Multiple Vitamins-Minerals (MULTIVITAMIN WITH MINERALS) tablet Take 1 tablet by mouth  daily.    [provider]    Family History Family History  Problem Relation Age of Onset  . Hypertension Mother   . Hypertension Father   . Cancer Father     Social History Social History   Tobacco Use  . Smoking status: Every Day    Packs/day: 0.50    Years: 10.00    Additional pack years: 0.00    Total pack years: 5.00    Types: Cigarettes  . Smokeless tobacco: Never  Vaping Use  . Vaping Use: Never used  Substance Use Topics  . Alcohol use: Yes    Comment: occasionally  . Drug use: Yes    Frequency: 5.0 times per week    Types: Marijuana     Allergies   Morphine and related   Review of Systems Review of Systems   Physical Exam Triage Vital Signs ED Triage Vitals  Enc Vitals Group     BP 06/02/22 1455 (!) 113/92     Pulse Rate 06/02/22 1455 79     Resp 06/02/22 1455 15     Temp 06/02/22 1455 97.7 F (36.5 C)     Temp Source 06/02/22 1455 Oral     SpO2 06/02/22 1455 94 %     Weight --      Height --      Head Circumference --      Peak Flow --      Pain Score 06/02/22 1456 8     Pain Loc --      Pain Edu? --      Excl. in GC? --    No data found.  Updated Vital Signs BP (!) 113/92 (BP Location: Right Arm)   Pulse 79   Temp 97.7 F (36.5 C) (Oral)   Resp 15   LMP 03/09/2011   SpO2 94%   Visual Acuity Right Eye Distance:   Left Eye Distance:   Bilateral Distance:    Right Eye Near:   Left Eye Near:    Bilateral Near:     Physical Exam   UC Treatments / Results  Labs (all labs ordered are listed, but only abnormal results are displayed) Labs Reviewed - No data to display  EKG   Radiology No results found.  Procedures Procedures (including critical care time)  Medications Ordered in UC Medications  methylPREDNISolone sodium succinate (SOLU-MEDROL) 125 mg/2 mL injection 60 mg (60 mg Intramuscular Given 06/02/22 1520)    Initial Impression / Assessment and Plan / UC Course  I have reviewed the triage vital  signs and the nursing notes.  Pertinent labs & imaging results that were available during my care of the patient were reviewed by me and considered in my medical decision making (see chart for details).     *** Final  Clinical Impressions(s) / UC Diagnoses   Final diagnoses:  Flare of rheumatoid arthritis (HCC)  Acute pain of right knee     Discharge Instructions      We have given you a steroid shot today.  Please start the oral prednisone tomorrow.  Follow-up with rheumatology as planned.   ED Prescriptions     Medication Sig Dispense Auth. Provider   predniSONE (DELTASONE) 10 MG tablet Take 6 tablets by mouth daily for 2 days, then reduce by 1 tablet every 2 days until gone 42 tablet Valentino Nose, NP      PDMP not reviewed this encounter.

## 2022-06-02 NOTE — ED Triage Notes (Signed)
Pt states she is having an RA flare up. Causing right knee pain, onset last night. Pt states he has tried treating pain with tylenol

## 2022-06-03 ENCOUNTER — Other Ambulatory Visit: Payer: Medicaid Other

## 2022-06-03 LAB — CBC WITH DIFFERENTIAL/PLATELET
Absolute Monocytes: 447 cells/uL (ref 200–950)
Basophils Absolute: 31 cells/uL (ref 0–200)
Eosinophils Relative: 0 %
Hemoglobin: 11.9 g/dL (ref 11.7–15.5)
Lymphs Abs: 1170 cells/uL (ref 850–3900)
MCV: 87.9 fL (ref 80.0–100.0)
Platelets: 488 10*3/uL — ABNORMAL HIGH (ref 140–400)
RDW: 16.6 % — ABNORMAL HIGH (ref 11.0–15.0)
WBC: 15.4 10*3/uL — ABNORMAL HIGH (ref 3.8–10.8)

## 2022-06-04 LAB — COMPLETE METABOLIC PANEL WITH GFR
AG Ratio: 1.1 (calc) (ref 1.0–2.5)
ALT: 24 U/L (ref 6–29)
AST: 34 U/L (ref 10–35)
Albumin: 4.1 g/dL (ref 3.6–5.1)
Alkaline phosphatase (APISO): 72 U/L (ref 31–125)
BUN: 20 mg/dL (ref 7–25)
CO2: 28 mmol/L (ref 20–32)
Calcium: 9.4 mg/dL (ref 8.6–10.2)
Chloride: 101 mmol/L (ref 98–110)
Creat: 0.98 mg/dL (ref 0.50–0.99)
Globulin: 3.6 g/dL (calc) (ref 1.9–3.7)
Glucose, Bld: 83 mg/dL (ref 65–99)
Potassium: 4.2 mmol/L (ref 3.5–5.3)
Sodium: 139 mmol/L (ref 135–146)
Total Bilirubin: 0.3 mg/dL (ref 0.2–1.2)
Total Protein: 7.7 g/dL (ref 6.1–8.1)
eGFR: 73 mL/min/{1.73_m2} (ref >=60)

## 2022-06-04 LAB — CBC WITH DIFFERENTIAL/PLATELET
Basophils Relative: 0.2 %
Eosinophils Absolute: 0 cells/uL — ABNORMAL LOW (ref 15–500)
HCT: 35.7 % (ref 35.0–45.0)
MCH: 29.3 pg (ref 27.0–33.0)
MCHC: 33.3 g/dL (ref 32.0–36.0)
MPV: 9.1 fL (ref 7.5–12.5)
Monocytes Relative: 2.9 %
Neutro Abs: 13752 cells/uL — ABNORMAL HIGH (ref 1500–7800)
Neutrophils Relative %: 89.3 %
RBC: 4.06 10*6/uL (ref 3.80–5.10)
Total Lymphocyte: 7.6 %

## 2022-06-04 LAB — LIPID PANEL
Cholesterol: 347 mg/dL — ABNORMAL HIGH (ref <200)
HDL: 82 mg/dL (ref >=50)
LDL Cholesterol (Calc): 232 mg/dL (calc) — ABNORMAL HIGH
Non-HDL Cholesterol (Calc): 265 mg/dL (calc) — ABNORMAL HIGH (ref <130)
Total CHOL/HDL Ratio: 4.2 (calc) (ref <5.0)
Triglycerides: 164 mg/dL — ABNORMAL HIGH (ref <150)

## 2022-06-12 ENCOUNTER — Emergency Department (HOSPITAL_COMMUNITY): Payer: Medicaid Other

## 2022-06-12 ENCOUNTER — Emergency Department (HOSPITAL_COMMUNITY)
Admission: EM | Admit: 2022-06-12 | Discharge: 2022-06-12 | Disposition: A | Discharge status: Home / Self Care | Payer: Medicaid Other | Attending: Emergency Medicine | Admitting: Emergency Medicine

## 2022-06-12 ENCOUNTER — Other Ambulatory Visit: Payer: Self-pay

## 2022-06-12 DIAGNOSIS — Q6102 Congenital multiple renal cysts: Secondary | ICD-10-CM

## 2022-06-12 DIAGNOSIS — R030 Elevated blood-pressure reading, without diagnosis of hypertension: Secondary | ICD-10-CM

## 2022-06-12 DIAGNOSIS — S3991XA Unspecified injury of abdomen, initial encounter: Secondary | ICD-10-CM | POA: Diagnosis not present

## 2022-06-12 DIAGNOSIS — N281 Cyst of kidney, acquired: Secondary | ICD-10-CM | POA: Diagnosis not present

## 2022-06-12 DIAGNOSIS — S20211A Contusion of right front wall of thorax, initial encounter: Secondary | ICD-10-CM | POA: Diagnosis not present

## 2022-06-12 DIAGNOSIS — S299XXA Unspecified injury of thorax, initial encounter: Secondary | ICD-10-CM | POA: Diagnosis present

## 2022-06-12 LAB — COMPREHENSIVE METABOLIC PANEL WITH GFR
ALT: 60 U/L — ABNORMAL HIGH (ref 0–44)
AST: 99 U/L — ABNORMAL HIGH (ref 15–41)
Albumin: 4.4 g/dL (ref 3.5–5.0)
Alkaline Phosphatase: 76 U/L (ref 38–126)
Anion gap: 13 (ref 5–15)
BUN: 13 mg/dL (ref 6–20)
CO2: 23 mmol/L (ref 22–32)
Calcium: 9.9 mg/dL (ref 8.9–10.3)
Chloride: 99 mmol/L (ref 98–111)
Creatinine, Ser: 1.35 mg/dL — ABNORMAL HIGH (ref 0.44–1.00)
GFR, Estimated: 49 mL/min — ABNORMAL LOW
Glucose, Bld: 89 mg/dL (ref 70–99)
Potassium: 3.3 mmol/L — ABNORMAL LOW (ref 3.5–5.1)
Sodium: 135 mmol/L (ref 135–145)
Total Bilirubin: 0.9 mg/dL (ref 0.3–1.2)
Total Protein: 8.8 g/dL — ABNORMAL HIGH (ref 6.5–8.1)

## 2022-06-12 LAB — CBC WITH DIFFERENTIAL/PLATELET
Abs Immature Granulocytes: 0.13 10*3/uL — ABNORMAL HIGH (ref 0.00–0.07)
Basophils Absolute: 0.1 10*3/uL (ref 0.0–0.1)
Basophils Relative: 1 %
Eosinophils Absolute: 0.1 10*3/uL (ref 0.0–0.5)
Eosinophils Relative: 1 %
HCT: 38.3 % (ref 36.0–46.0)
Hemoglobin: 12.9 g/dL (ref 12.0–15.0)
Immature Granulocytes: 1 %
Lymphocytes Relative: 13 %
Lymphs Abs: 1.9 10*3/uL (ref 0.7–4.0)
MCH: 29.5 pg (ref 26.0–34.0)
MCHC: 33.7 g/dL (ref 30.0–36.0)
MCV: 87.4 fL (ref 80.0–100.0)
Monocytes Absolute: 0.6 10*3/uL (ref 0.1–1.0)
Monocytes Relative: 4 %
Neutro Abs: 11.3 10*3/uL — ABNORMAL HIGH (ref 1.7–7.7)
Neutrophils Relative %: 80 %
Platelets: 468 10*3/uL — ABNORMAL HIGH (ref 150–400)
RBC: 4.38 MIL/uL (ref 3.87–5.11)
RDW: 17.6 % — ABNORMAL HIGH (ref 11.5–15.5)
WBC: 14.2 10*3/uL — ABNORMAL HIGH (ref 4.0–10.5)
nRBC: 0 % (ref 0.0–0.2)

## 2022-06-12 MED ORDER — FENTANYL CITRATE PF 50 MCG/ML IJ SOSY
50.0000 ug | PREFILLED_SYRINGE | Freq: Once | INTRAMUSCULAR | Status: AC
  Administered 2022-06-12: 50 ug via INTRAVENOUS
  Filled 2022-06-12: qty 1

## 2022-06-12 MED ORDER — IOHEXOL 300 MG/ML  SOLN
75.0000 mL | Freq: Once | INTRAMUSCULAR | Status: AC | PRN
  Administered 2022-06-12: 75 mL via INTRAVENOUS
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
## 2022-06-17 ENCOUNTER — Encounter: Payer: Medicaid Other | Admitting: Family Medicine

## 2022-06-22 ENCOUNTER — Other Ambulatory Visit: Payer: Self-pay | Admitting: Family Medicine

## 2022-06-22 MED ORDER — ROSUVASTATIN CALCIUM 10 MG PO TABS: 10.0000 mg | ORAL_TABLET | Freq: Every day | ORAL | 3 refills | Status: DC

## 2022-07-09 ENCOUNTER — Ambulatory Visit
Admission: RE | Admit: 2022-07-09 | Discharge: 2022-07-09 | Disposition: A | Discharge status: Home / Self Care | Payer: Medicaid Other | Source: Ambulatory Visit | Attending: Nurse Practitioner | Admitting: Nurse Practitioner

## 2022-07-09 VITALS — BP 129/88 | HR 77 | Temp 97.9°F | Resp 10

## 2022-07-09 DIAGNOSIS — R0781 Pleurodynia: Secondary | ICD-10-CM | POA: Diagnosis not present

## 2022-07-09 MED ORDER — LIDOCAINE 5 % EX PTCH: 1.0000 | MEDICATED_PATCH | CUTANEOUS | 0 refills | Status: DC

## 2022-07-09 MED ORDER — TIZANIDINE HCL 2 MG PO TABS: 2.0000 mg | ORAL_TABLET | Freq: Every evening | ORAL | 0 refills | Status: DC | PRN

## 2022-07-09 NOTE — Discharge Instructions (Addendum)
I suspect you may have pulled a muscle in your rib cage.  In addition to Tylenol 500 to 1000 mg every 6 hours, start using the lidocaine patches, cool/warm compresses 15 minutes on, 45 minutes off every hour while awake.  At nighttime, you can take the tizanidine to help with muscular pain.  Follow-up with PCP if no improvement or worsening of symptoms despite treatment.
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
## 2022-07-14 ENCOUNTER — Ambulatory Visit
Admission: RE | Admit: 2022-07-14 | Discharge: 2022-07-14 | Disposition: A | Discharge status: Home / Self Care | Payer: Medicaid Other | Source: Ambulatory Visit | Attending: Family Medicine | Admitting: Family Medicine

## 2022-07-14 DIAGNOSIS — Z1231 Encounter for screening mammogram for malignant neoplasm of breast: Secondary | ICD-10-CM

## 2022-07-21 ENCOUNTER — Ambulatory Visit: Payer: Medicaid Other | Admitting: Family Medicine

## 2022-07-21 VITALS — BP 125/90 | HR 76 | Temp 98.0°F | Ht 64.0 in | Wt 109.0 lb

## 2022-07-21 DIAGNOSIS — F419 Anxiety disorder, unspecified: Secondary | ICD-10-CM | POA: Diagnosis not present

## 2022-07-21 DIAGNOSIS — E782 Mixed hyperlipidemia: Secondary | ICD-10-CM | POA: Insufficient documentation

## 2022-07-21 DIAGNOSIS — I10 Essential (primary) hypertension: Secondary | ICD-10-CM | POA: Diagnosis not present

## 2022-07-21 DIAGNOSIS — H539 Unspecified visual disturbance: Secondary | ICD-10-CM | POA: Diagnosis not present

## 2022-07-21 DIAGNOSIS — H538 Other visual disturbances: Secondary | ICD-10-CM | POA: Insufficient documentation

## 2022-07-21 MED ORDER — ESCITALOPRAM OXALATE 10 MG PO TABS: 10.0000 mg | ORAL_TABLET | Freq: Every day | ORAL | 1 refills | Status: DC

## 2022-07-21 MED ORDER — CHLORTHALIDONE 50 MG PO TABS: 25.0000 mg | ORAL_TABLET | Freq: Every day | ORAL | 1 refills | Status: AC

## 2022-07-21 MED ORDER — ROSUVASTATIN CALCIUM 20 MG PO TABS: 20.0000 mg | ORAL_TABLET | Freq: Every day | ORAL | 3 refills | Status: AC
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
## 2022-07-21 NOTE — Assessment & Plan Note (Signed)
Discussed importance of heart healthy diet and 150 minutes moderate intensity exercise weekly. Handout provided. She is OK with taking medication to help lower her cholesterol. Will start Crestor 20mg  daily.

## 2022-07-21 NOTE — Assessment & Plan Note (Signed)
Patient endorses worsening vision and would like vision screening. No concerning findings on exam.

## 2022-07-21 NOTE — Assessment & Plan Note (Signed)
BP today 122/90. Increase Chlorthalidone to 50mg  daily. CMP today. Return to office sooner if BP remains >180/120 or <100/60. Seek medical care for chest pain, palpitations, shortness of breath, recurrent headaches, or vision changes. Follow up in 4 weeks.

## 2022-07-22 ENCOUNTER — Ambulatory Visit
Admission: RE | Admit: 2022-07-22 | Discharge: 2022-07-22 | Disposition: A | Discharge status: Home / Self Care | Payer: Medicaid Other | Source: Ambulatory Visit | Attending: Family Medicine | Admitting: Family Medicine

## 2022-07-22 VITALS — BP 106/75 | HR 84 | Temp 98.0°F | Resp 18

## 2022-07-22 DIAGNOSIS — M069 Rheumatoid arthritis, unspecified: Secondary | ICD-10-CM | POA: Diagnosis not present

## 2022-07-22 LAB — COMPREHENSIVE METABOLIC PANEL
AG Ratio: 1.3 (calc) (ref 1.0–2.5)
ALT: 36 U/L — ABNORMAL HIGH (ref 6–29)
AST: 83 U/L — ABNORMAL HIGH (ref 10–35)
Albumin: 4.3 g/dL (ref 3.6–5.1)
Alkaline phosphatase (APISO): 82 U/L (ref 31–125)
BUN/Creatinine Ratio: 17 (calc) (ref 6–22)
BUN: 17 mg/dL (ref 7–25)
CO2: 27 mmol/L (ref 20–32)
Calcium: 8.9 mg/dL (ref 8.6–10.2)
Chloride: 102 mmol/L (ref 98–110)
Creat: 1.02 mg/dL — ABNORMAL HIGH (ref 0.50–0.99)
Globulin: 3.2 g/dL (calc) (ref 1.9–3.7)
Glucose, Bld: 73 mg/dL (ref 65–99)
Potassium: 3.8 mmol/L (ref 3.5–5.3)
Sodium: 138 mmol/L (ref 135–146)
Total Bilirubin: 0.3 mg/dL (ref 0.2–1.2)
Total Protein: 7.5 g/dL (ref 6.1–8.1)

## 2022-07-22 MED ORDER — METHYLPREDNISOLONE ACETATE 40 MG/ML IJ SUSP
40.0000 mg | Freq: Once | INTRAMUSCULAR | Status: AC
  Administered 2022-07-22: 40 mg via INTRAMUSCULAR

## 2022-07-22 MED ORDER — PREDNISONE 10 MG PO TABS: ORAL_TABLET | ORAL | 0 refills | Status: DC

## 2022-07-22 NOTE — ED Triage Notes (Signed)
Pt reports her hands are swollen and painful x  2 days. Pt states her right foot is also having a RA flare up.   New RA is declining to see her until she gets discharged by her current one.

## 2022-07-22 NOTE — Discharge Instructions (Signed)
Follow-up as soon as possible with rheumatologist

## 2022-07-22 NOTE — ED Provider Notes (Signed)
[facility]    CSN: [id number] Arrival date & time: 07/22/22  1706      History   Chief Complaint Chief Complaint  Patient presents with   Hand Problem    RA flare - Entered by patient    HPI Linda Calderon is a 47 y.o. female.   Patient presenting today with 2-day history of rheumatoid arthritis flare in the right hand, right foot and starting up in the left hand.  No new injury prior to onset.  States the pain is so bad she can barely move her hands at this time.  Denies fever, chills, rashes, numbness, tingling.  Trying ibuprofen with no relief.  Typically gets a steroid shot and prednisone when this happens.  She is between rheumatologist at this point and trying to get someone new to see her but having no luck so far.    Past Medical History:  Diagnosis Date   ADD (attention deficit disorder)    Arthritis    Chronic neck and back pain    Pneumonia    PTSD (post-traumatic stress disorder)    Radiculopathy    UTI (lower urinary tract infection)     Patient Active Problem List   Diagnosis Date Noted   Anxiety 07/21/2022   Mixed hyperlipidemia 07/21/2022   Blurred vision, bilateral 07/21/2022   Vision changes 07/21/2022   Cigarette nicotine dependence without complication 05/27/2022   Hypertension 04/26/2022   Rheumatoid arthritis with rheumatoid factor of multiple sites without organ or systems involvement (HCC) 04/03/2022   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)    Pulmonary infiltrates 07/31/2019   Bronchiolitis 07/31/2019   DDD (degenerative disc disease), lumbar 07/31/2019   PTSD (post-traumatic stress disorder) 07/31/2019   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   Smoker 07/31/2019   Rheumatoid arthritis flare (HCC) 11/25/2017   Dizziness 04/05/2017   Encounter for long-term (current) use of high-risk medication 04/05/2017   Seropositive rheumatoid arthritis (HCC) 03/24/2017    Chronic low back pain 09/12/2012   Sciatica of right side 09/12/2012    Past Surgical History:  Procedure Laterality Date   ABDOMINAL HYSTERECTOMY     TUBAL LIGATION     VIDEO BRONCHOSCOPY N/A 08/18/2019   Procedure: FLEXIBLE BRONCHOSCOPY WITH BRONCHOALVEOLAR LAVAGE;  Surgeon: Kalman Shan, MD;  Location: [facility];  Service: Cardiopulmonary;  Laterality: N/A;    OB History     Gravida  8   Para  1   Term  1   Preterm      AB  7   Living  1      SAB  7   IAB      Ectopic      Multiple      Live Births               Home Medications    Prior to Admission medications   Medication Sig Start Date End Date Taking? Authorizing Provider  predniSONE (DELTASONE) 10 MG tablet Take 6 tabs day one, 5 tabs day two, 4 tabs day three, etc 07/22/22  Yes Particia Nearing, PA-C  chlorthalidone (HYGROTON) 50 MG tablet Take 0.5 tablets (25 mg total) by mouth daily. 07/21/22   Park Meo, FNP  escitalopram (LEXAPRO) 10 MG tablet Take 1 tablet (10 mg total) by mouth daily. 07/21/22   Park Meo, FNP  Multiple Vitamins-Minerals (MULTIVITAMIN WITH MINERALS) tablet Take 1 tablet by mouth daily.    [provider]  rosuvastatin (CRESTOR) 20 MG tablet Take 1 tablet (20 mg total) by mouth daily. 07/21/22   Park Meo, FNP    Family History Family History  Problem Relation Age of Onset   Hypertension Mother    Hypertension Father    Cancer Father    Breast cancer Maternal Aunt    Breast cancer Maternal Aunt     Social History Social History   Tobacco Use   Smoking status: Every Day    Packs/day: 0.50    Years: 10.00    Additional pack years: 0.00    Total pack years: 5.00    Types: Cigarettes   Smokeless tobacco: Never  Vaping Use   Vaping Use: Never used  Substance Use Topics   Alcohol use: Yes    Comment: occasionally   Drug use: Yes    Frequency: 5.0 times per week    Types: Marijuana     Allergies   Morphine and  codeine   Review of Systems Review of Systems Per HPI  Physical Exam Triage Vital Signs ED Triage Vitals  Enc Vitals Group     BP 07/22/22 1717 106/75     Pulse Rate 07/22/22 1717 84     Resp 07/22/22 1717 18     Temp 07/22/22 1717 98 F (36.7 C)     Temp Source 07/22/22 1717 Oral     SpO2 07/22/22 1717 93 %     Weight --      Height --      Head Circumference --      Peak Flow --      Pain Score 07/22/22 1721 9     Pain Loc --      Pain Edu? --      Excl. in GC? --    No data found.  Updated Vital Signs BP 106/75 (BP Location: Right Arm)   Pulse 84   Temp 98 F (36.7 C) (Oral)   Resp 18   LMP 03/09/2011   SpO2 93%   Visual Acuity Right Eye Distance:   Left Eye Distance:   Bilateral Distance:    Right Eye Near:   Left Eye Near:    Bilateral Near:     Physical Exam Vitals and nursing note reviewed.  Constitutional:      Appearance: Normal appearance. She is not ill-appearing.  HENT:     Head: Atraumatic.  Eyes:     Extraocular Movements: Extraocular movements intact.     Conjunctiva/sclera: Conjunctivae normal.  Cardiovascular:     Rate and Rhythm: Normal rate and regular rhythm.     Heart sounds: Normal heart sounds.  Pulmonary:     Effort: Pulmonary effort is normal.     Breath sounds: Normal breath sounds.  Musculoskeletal:        General: Swelling, tenderness and deformity present. Normal range of motion.     Cervical back: Normal range of motion and neck supple.     Comments: Moderate deformities to the right hand from chronic rheumatoid arthritis, hands edematous, right foot edematous.  Antalgic motions in these areas  Skin:    General: Skin is warm and dry.  Neurological:     Mental Status: She is alert and oriented to person, place, and time.     Comments: All 4 extremities neurovascularly intact  Psychiatric:        Mood and Affect: Mood normal.        Thought Content: Thought content normal.  Judgment: Judgment normal.       UC Treatments / Results  Labs (all labs ordered are listed, but only abnormal results are displayed) Labs Reviewed - No data to display  EKG   Radiology No results found.  Procedures Procedures (including critical care time)  Medications Ordered in UC Medications  methylPREDNISolone acetate (DEPO-MEDROL) injection 40 mg (40 mg Intramuscular Given 07/22/22 1745)    Initial Impression / Assessment and Plan / UC Course  I have reviewed the triage vital signs and the nursing notes.  Pertinent labs & imaging results that were available during my care of the patient were reviewed by me and considered in my medical decision making (see chart for details).     Currently between rheumatologist, discussed importance of establishing care with 1 to follow her ongoing for her significantly uncontrolled return arthritis.  For this flare, will treat with IM Depo-Medrol, prednisone and continued supportive home care and over-the-counter medications.  Return for worsening symptoms.  Final Clinical Impressions(s) / UC Diagnoses   Final diagnoses:  Rheumatoid arthritis flare [facility])     Discharge Instructions      Follow-up as soon as possible with rheumatologist    ED Prescriptions     Medication Sig Dispense Auth. Provider   predniSONE (DELTASONE) 10 MG tablet Take 6 tabs day one, 5 tabs day two, 4 tabs day three, etc 21 tablet Particia Nearing, [place]      PDMP not reviewed this encounter.   Particia Nearing, [place] 07/22/22 [phone]

## 2022-08-04 ENCOUNTER — Other Ambulatory Visit: Payer: Self-pay | Admitting: Family Medicine

## 2022-08-04 DIAGNOSIS — M05711 Rheumatoid arthritis with rheumatoid factor of right shoulder without organ or systems involvement: Secondary | ICD-10-CM

## 2022-08-13 ENCOUNTER — Other Ambulatory Visit: Payer: Self-pay

## 2022-08-13 ENCOUNTER — Ambulatory Visit
Admission: RE | Admit: 2022-08-13 | Discharge: 2022-08-13 | Disposition: A | Discharge status: Home / Self Care | Payer: Medicaid Other | Source: Ambulatory Visit | Attending: Family Medicine | Admitting: Family Medicine

## 2022-08-13 ENCOUNTER — Encounter (HOSPITAL_COMMUNITY): Payer: Self-pay

## 2022-08-13 ENCOUNTER — Emergency Department (HOSPITAL_COMMUNITY)
Admission: EM | Admit: 2022-08-13 | Discharge: 2022-08-14 | Disposition: A | Discharge status: Home / Self Care | Payer: Medicaid Other | Attending: Emergency Medicine | Admitting: Emergency Medicine

## 2022-08-13 VITALS — BP 107/74 | HR 98 | Temp 97.4°F | Resp 16

## 2022-08-13 DIAGNOSIS — I10 Essential (primary) hypertension: Secondary | ICD-10-CM | POA: Diagnosis not present

## 2022-08-13 DIAGNOSIS — D72829 Elevated white blood cell count, unspecified: Secondary | ICD-10-CM | POA: Insufficient documentation

## 2022-08-13 DIAGNOSIS — M25532 Pain in left wrist: Secondary | ICD-10-CM | POA: Diagnosis not present

## 2022-08-13 DIAGNOSIS — H538 Other visual disturbances: Secondary | ICD-10-CM | POA: Diagnosis not present

## 2022-08-13 DIAGNOSIS — M25531 Pain in right wrist: Secondary | ICD-10-CM | POA: Diagnosis not present

## 2022-08-13 DIAGNOSIS — R519 Headache, unspecified: Secondary | ICD-10-CM | POA: Diagnosis present

## 2022-08-13 DIAGNOSIS — R220 Localized swelling, mass and lump, head: Secondary | ICD-10-CM | POA: Diagnosis not present

## 2022-08-13 DIAGNOSIS — R479 Unspecified speech disturbances: Secondary | ICD-10-CM

## 2022-08-13 DIAGNOSIS — Z79899 Other long term (current) drug therapy: Secondary | ICD-10-CM | POA: Insufficient documentation

## 2022-08-13 DIAGNOSIS — R109 Unspecified abdominal pain: Secondary | ICD-10-CM | POA: Diagnosis not present

## 2022-08-13 LAB — COMPREHENSIVE METABOLIC PANEL
ALT: 46 U/L — ABNORMAL HIGH (ref 0–44)
AST: 54 U/L — ABNORMAL HIGH (ref 15–41)
Albumin: 4.1 g/dL (ref 3.5–5.0)
Alkaline Phosphatase: 96 U/L (ref 38–126)
Anion gap: 9 (ref 5–15)
BUN: 12 mg/dL (ref 6–20)
CO2: 31 mmol/L (ref 22–32)
Calcium: 8.6 mg/dL — ABNORMAL LOW (ref 8.9–10.3)
Chloride: 94 mmol/L — ABNORMAL LOW (ref 98–111)
Creatinine, Ser: 1.09 mg/dL — ABNORMAL HIGH (ref 0.44–1.00)
GFR, Estimated: >60 mL/min (ref >60)
Glucose, Bld: 125 mg/dL — ABNORMAL HIGH (ref 70–99)
Potassium: 3.3 mmol/L — ABNORMAL LOW (ref 3.5–5.1)
Sodium: 134 mmol/L — ABNORMAL LOW (ref 135–145)
Total Bilirubin: 0.5 mg/dL (ref 0.3–1.2)
Total Protein: 8.1 g/dL (ref 6.5–8.1)

## 2022-08-13 LAB — CBC
HCT: 38.2 % (ref 36.0–46.0)
Hemoglobin: 12.6 g/dL (ref 12.0–15.0)
MCH: 31.1 pg (ref 26.0–34.0)
MCHC: 33 g/dL (ref 30.0–36.0)
MCV: 94.3 fL (ref 80.0–100.0)
Platelets: 383 10*3/uL (ref 150–400)
RBC: 4.05 MIL/uL (ref 3.87–5.11)
RDW: 15.1 % (ref 11.5–15.5)
WBC: 13.6 10*3/uL — ABNORMAL HIGH (ref 4.0–10.5)
nRBC: 0 % (ref 0.0–0.2)

## 2022-08-13 MED ORDER — DEXAMETHASONE SODIUM PHOSPHATE 10 MG/ML IJ SOLN
10.0000 mg | Freq: Once | INTRAMUSCULAR | Status: AC
  Administered 2022-08-14: 10 mg via INTRAMUSCULAR
  Filled 2022-08-13: qty 1
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
## 2022-08-13 NOTE — Discharge Instructions (Addendum)
Go to the ER to have more test than what we can do here

## 2022-08-13 NOTE — ED Triage Notes (Signed)
States she is having an RA flare up.  States her face and feet and ankles are swollen x 1 month.  States swelling went down and returned 3 days ago.  States vision is blurry at times and she has a headache.  States her speech has been slurred for a month now.
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
## 2022-08-13 NOTE — ED Notes (Signed)
Patient is being discharged from the Urgent Care and sent to the Emergency Department via private vehicle . Per PA, patient is in need of higher level of care due to facial swelling and slurred speech. Patient is aware and verbalizes understanding of plan of care.  Vitals:   08/13/22 1852  BP: 107/74  Pulse: 98  Resp: 16  Temp: (!) 97.4 F (36.3 C)  SpO2: 92%

## 2022-08-14 ENCOUNTER — Emergency Department (HOSPITAL_COMMUNITY): Payer: Medicaid Other

## 2022-08-14 MED ORDER — HYDROCODONE-ACETAMINOPHEN 5-325 MG PO TABS
2.0000 | ORAL_TABLET | Freq: Once | ORAL | Status: AC
  Administered 2022-08-14: 2 via ORAL
  Filled 2022-08-14: qty 2

## 2022-08-14 MED ORDER — PREDNISONE 10 MG PO TABS: 10.0000 mg | ORAL_TABLET | Freq: Two times a day (BID) | ORAL | 0 refills | Status: DC
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
## 2022-08-14 NOTE — Discharge Instructions (Addendum)
Begin taking prednisone as prescribed.  Follow-up with your primary doctor if not improving in the next few days.

## 2022-08-17 ENCOUNTER — Telehealth: Payer: Self-pay

## 2022-08-17 NOTE — Transitions of Care (Post Inpatient/ED Visit) (Signed)
   08/17/2022  Name: SHANDREA AHLMAN MRN: [id number] DOB: 1975/08/02  Today's TOC FU Call Status: Today's TOC FU Call Status:: Unsuccessul Call (1st Attempt) Unsuccessful Call (1st Attempt) Date: 08/17/22  Attempted to reach the patient regarding the most recent Inpatient/ED visit.  Follow Up Plan: Additional outreach attempts will be made to reach the patient to complete the Transitions of Care (Post Inpatient/ED visit) call.   Signature   Woodfin Ganja LPN [facility] Nurse Health Advisor Direct Dial [phone]

## 2022-08-18 ENCOUNTER — Encounter: Payer: Self-pay | Admitting: Family Medicine

## 2022-08-18 ENCOUNTER — Ambulatory Visit: Payer: Medicaid Other | Admitting: Family Medicine

## 2022-08-18 VITALS — BP 120/90 | HR 95 | Temp 97.6°F | Ht 64.0 in | Wt 107.0 lb

## 2022-08-18 DIAGNOSIS — M0579 Rheumatoid arthritis with rheumatoid factor of multiple sites without organ or systems involvement: Secondary | ICD-10-CM | POA: Diagnosis not present

## 2022-08-18 DIAGNOSIS — F419 Anxiety disorder, unspecified: Secondary | ICD-10-CM

## 2022-08-18 DIAGNOSIS — Z79899 Other long term (current) drug therapy: Secondary | ICD-10-CM | POA: Insufficient documentation

## 2022-08-18 NOTE — Transitions of Care (Post Inpatient/ED Visit) (Signed)
   08/18/2022  Name: Linda Calderon MRN: [id number] DOB: [birth year]  Today's TOC FU Call Status: Today's TOC FU Call Status:: Unsuccessful Call (2nd Attempt) Unsuccessful Call (1st Attempt) Date: 08/17/22 Unsuccessful Call (2nd Attempt) Date: 08/18/22  Attempted to reach the patient regarding the most recent Inpatient/ED visit.  Follow Up Plan: Additional outreach attempts will be made to reach the patient to complete the Transitions of Care (Post Inpatient/ED visit) call.   Signature   Woodfin Ganja LPN [facility] Nurse Health Advisor Direct Dial [phone]
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
## 2022-08-19 MED ORDER — ESCITALOPRAM OXALATE 20 MG PO TABS: 20.0000 mg | ORAL_TABLET | Freq: Every day | ORAL | 1 refills | Status: AC
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
## 2022-08-19 NOTE — Transitions of Care (Post Inpatient/ED Visit) (Signed)
   08/19/2022  Name: Linda Calderon MRN: [id number] DOB: [birth year]  Today's TOC FU Call Status: Today's TOC FU Call Status:: Successful TOC FU Call Completed Unsuccessful Call (1st Attempt) Date: 08/17/22 Unsuccessful Call (2nd Attempt) Date: 08/18/22 Unsuccessful Call (3rd Attempt) Date: 08/19/22 [facility] FU Call Complete Date: 08/19/22  Attempted to reach the patient regarding the most recent Inpatient/ED visit.  Follow Up Plan: No further outreach attempts will be made at this time. We have been unable to contact the patient.  Signature  Woodfin Ganja LPN [facility] Nurse Health Advisor Direct Dial [phone]

## 2022-08-20 ENCOUNTER — Other Ambulatory Visit: Payer: Self-pay

## 2022-08-20 DIAGNOSIS — Z23 Encounter for immunization: Secondary | ICD-10-CM

## 2022-08-21 ENCOUNTER — Telehealth: Payer: Self-pay
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
## 2022-09-16 ENCOUNTER — Ambulatory Visit: Payer: Medicaid Other

## 2022-09-16 ENCOUNTER — Ambulatory Visit: Payer: Medicaid Other | Admitting: Family Medicine

## 2022-10-12 ENCOUNTER — Other Ambulatory Visit: Payer: Self-pay

## 2022-10-12 ENCOUNTER — Ambulatory Visit
Admission: RE | Admit: 2022-10-12 | Discharge: 2022-10-12 | Disposition: A | Discharge status: Home / Self Care | Payer: Medicaid Other | Source: Ambulatory Visit | Attending: Nurse Practitioner | Admitting: Nurse Practitioner

## 2022-10-12 VITALS — BP 106/75 | HR 80 | Temp 97.5°F | Resp 18

## 2022-10-12 DIAGNOSIS — M069 Rheumatoid arthritis, unspecified: Secondary | ICD-10-CM | POA: Diagnosis not present

## 2022-10-12 MED ORDER — PREDNISONE 20 MG PO TABS: 40.0000 mg | ORAL_TABLET | Freq: Every day | ORAL | 0 refills | Status: AC

## 2022-10-12 MED ORDER — METHYLPREDNISOLONE SODIUM SUCC 125 MG IJ SOLR
80.0000 mg | Freq: Once | INTRAMUSCULAR | Status: AC
  Administered 2022-10-12: 80 mg via INTRAMUSCULAR

## 2022-10-12 NOTE — ED Provider Notes (Signed)
[facility]    CSN: [id number] Arrival date & time: 10/12/22  1756      History   Chief Complaint Chief Complaint  Patient presents with   Joint Pain    RA flare - Entered by patient    HPI Linda Calderon is a 47 y.o. female.   The history is provided by the patient.   Patient presents for complaints of a RA flare.  Patient reports swelling in bilateral hands, wrist, feet, and ankles.  Patient states she has been dealing with symptoms for the past several weeks.  Patient reports increased pain with ambulating, and states there is difficulty moving her hands and fingers.  She reports that she has been taking her leflunomide for her RA.  She states that she is planning to to call the doctor at [facility] tomorrow as she currently does not have a rheumatologist.  Patient states that she has followed up with her PCP, but PCP will not give her any medication for her symptoms.  Patient denies injury or trauma, chest pain, abdominal pain, nausea, vomiting, diarrhea, fever or chills.  Reports she has been taking Tylenol and ibuprofen for symptoms.  Past Medical History:  Diagnosis Date   ADD (attention deficit disorder)    Arthritis    Chronic neck and back pain    Pneumonia    PTSD (post-traumatic stress disorder)    Radiculopathy    UTI (lower urinary tract infection)     Patient Active Problem List   Diagnosis Date Noted   Controlled substance agreement signed 08/18/2022   Anxiety 07/21/2022   Mixed hyperlipidemia 07/21/2022   Blurred vision, bilateral 07/21/2022   Vision changes 07/21/2022   Cigarette nicotine dependence without complication 05/27/2022   Hypertension 04/26/2022   Rheumatoid arthritis with rheumatoid factor of multiple sites without organ or systems involvement (HCC) 04/03/2022   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)    Pulmonary infiltrates 07/31/2019   Bronchiolitis 07/31/2019   DDD (degenerative disc disease),  lumbar 07/31/2019   PTSD (post-traumatic stress disorder) 07/31/2019   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   Smoker 07/31/2019   Rheumatoid arthritis flare (HCC) 11/25/2017   Dizziness 04/05/2017   Encounter for long-term (current) use of high-risk medication 04/05/2017   Seropositive rheumatoid arthritis (HCC) 03/24/2017   Chronic low back pain 09/12/2012   Sciatica of right side 09/12/2012    Past Surgical History:  Procedure Laterality Date   ABDOMINAL HYSTERECTOMY     TUBAL LIGATION     VIDEO BRONCHOSCOPY N/A 08/18/2019   Procedure: FLEXIBLE BRONCHOSCOPY WITH BRONCHOALVEOLAR LAVAGE;  Surgeon: Kalman Shan, MD;  Location: [facility];  Service: Cardiopulmonary;  Laterality: N/A;    OB History     Gravida  8   Para  1   Term  1   Preterm      AB  7   Living  1      SAB  7   IAB      Ectopic      Multiple      Live Births               Home Medications    Prior to Admission medications   Medication Sig Start Date End Date Taking? Authorizing Provider  chlorthalidone (HYGROTON) 50 MG tablet Take 0.5 tablets (25 mg total) by mouth daily. 07/21/22  Yes Howard, Amber S, FNP  escitalopram (LEXAPRO) 20 MG tablet Take 1 tablet (20 mg total) by  mouth daily. 08/19/22  Yes Park Meo, FNP  Multiple Vitamins-Minerals (MULTIVITAMIN WITH MINERALS) tablet Take 1 tablet by mouth daily.   Yes [provider]  predniSONE (DELTASONE) 20 MG tablet Take 2 tablets (40 mg total) by mouth daily with breakfast for 5 days. 10/12/22 10/17/22 Yes Kavontae Pritchard-Warren, [name], NP  rosuvastatin (CRESTOR) 20 MG tablet Take 1 tablet (20 mg total) by mouth daily. 07/21/22   Park Meo, FNP    Family History Family History  Problem Relation Age of Onset   Hypertension Mother    Hypertension Father    Cancer Father    Breast cancer Maternal Aunt    Breast cancer Maternal Aunt     Social History Social History   Tobacco Use    Smoking status: Every Day    Current packs/day: 0.50    Average packs/day: 0.5 packs/day for 10.0 years (5.0 ttl pk-yrs)    Types: Cigarettes   Smokeless tobacco: Never  Vaping Use   Vaping status: Never Used  Substance Use Topics   Alcohol use: Yes    Comment: occasionally   Drug use: Yes    Frequency: 5.0 times per week    Types: Marijuana     Allergies   Morphine and codeine   Review of Systems Review of Systems Per HPI  Physical Exam Triage Vital Signs ED Triage Vitals  Encounter Vitals Group     BP 10/12/22 1824 106/75     Systolic BP Percentile --      Diastolic BP Percentile --      Pulse Rate 10/12/22 1824 80     Resp 10/12/22 1824 18     Temp 10/12/22 1824 (!) 97.5 F (36.4 C)     Temp Source 10/12/22 1824 Oral     SpO2 --      Weight --      Height --      Head Circumference --      Peak Flow --      Pain Score 10/12/22 1827 8     Pain Loc --      Pain Education --      Exclude from Growth Chart --    No data found.  Updated Vital Signs BP 106/75 (BP Location: Right Arm)   Pulse 80   Temp (!) 97.5 F (36.4 C) (Oral)   Resp 18   LMP 03/09/2011   Visual Acuity Right Eye Distance:   Left Eye Distance:   Bilateral Distance:    Right Eye Near:   Left Eye Near:    Bilateral Near:     Physical Exam Vitals and nursing note reviewed.  Constitutional:      General: She is not in acute distress.    Appearance: Normal appearance.  HENT:     Head: Normocephalic.  Eyes:     Extraocular Movements: Extraocular movements intact.     Pupils: Pupils are equal, round, and reactive to light.  Cardiovascular:     Rate and Rhythm: Normal rate and regular rhythm.     Pulses: Normal pulses.     Heart sounds: Normal heart sounds.  Pulmonary:     Effort: Pulmonary effort is normal.     Breath sounds: Normal breath sounds.  Abdominal:     General: Bowel sounds are normal.     Palpations: Abdomen is soft.  Musculoskeletal:     Right wrist: Swelling,  deformity and tenderness present. Decreased range of motion. Normal pulse.  Left wrist: Swelling, deformity and tenderness present. Decreased range of motion. Normal pulse.     Right hand: Swelling, deformity and tenderness present. Decreased range of motion. Normal capillary refill. Normal pulse.     Left hand: Swelling, deformity and tenderness present. Decreased range of motion. Normal capillary refill. Normal pulse.     Cervical back: Normal range of motion.     Right ankle: Swelling and deformity present. Tenderness present. Decreased range of motion. Normal pulse.     Left ankle: Swelling and deformity present. Tenderness present. Decreased range of motion. Normal pulse.     Right foot: Decreased range of motion. Normal capillary refill. Swelling and tenderness present. No deformity. Normal pulse.     Left foot: Decreased range of motion. Normal capillary refill. Swelling and tenderness present. No deformity. Normal pulse.     Comments: Joints are swollen, with deformity present.  Areas are warm to palpation.   Skin:    General: Skin is warm and dry.  Neurological:     General: No focal deficit present.     Mental Status: She is alert and oriented to person, place, and time.  Psychiatric:        Mood and Affect: Mood normal.        Behavior: Behavior normal.      UC Treatments / Results  Labs (all labs ordered are listed, but only abnormal results are displayed) Labs Reviewed - No data to display  EKG   Radiology No results found.  Procedures Procedures (including critical care time)  Medications Ordered in UC Medications  methylPREDNISolone sodium succinate (SOLU-MEDROL) 125 mg/2 mL injection 80 mg (has no administration in time range)    Initial Impression / Assessment and Plan / UC Course  I have reviewed the triage vital signs and the nursing notes.  Pertinent labs & imaging results that were available during my care of the patient were reviewed by me and  considered in my medical decision making (see chart for details).  Symptoms consistent with RA flare.  Discussion with patient regarding the importance of following up with rheumatology and regarding adherence to medication.  Advised patient that repeated steroid injections and steroid use is not recommended as long-term use for treatment for RA.  Patient was advised to contact [facility] tomorrow to schedule an appointment as soon as possible.  Will start patient on prednisone 40 mg for the next 5 days for RA flare.  Supportive care recommendations were provided and discussed with the patient to include use of over-the-counter Tylenol while taking prednisone, increasing fluids, and allowing for plenty of rest.  Patient is in agreement with this plan of care and verbalizes understanding.  All questions were answered.  Patient stable for discharge.   Final Clinical Impressions(s) / UC Diagnoses   Final diagnoses:  Flare of rheumatoid arthritis (HCC)     Discharge Instructions      You were given an injection of Solu-Medrol 80 mg today.  Start the prednisone on 10/13/2022. Take medication as prescribed. While you are taking prednisone, you may take Tylenol arthritis strength 650 mg tablets as needed for breakthrough pain or discomfort. Increase fluids and allow for plenty of rest. Please contact [facility] tomorrow to schedule an appointment for rheumatology for further evaluation and continued maintenance and treatment. Follow-up as needed.     ED Prescriptions     Medication Sig Dispense Auth. Provider   predniSONE (DELTASONE) 20 MG tablet Take 2 tablets (40 mg total) by mouth daily with  breakfast for 5 days. 10 tablet Dhanvi Boesen-Warren, [name], NP      PDMP not reviewed this encounter.   Abran Cantor, NP 10/12/22 1843

## 2022-10-12 NOTE — Discharge Instructions (Addendum)
You were given an injection of Solu-Medrol 80 mg today.  Start the prednisone on 10/13/2022. Take medication as prescribed. While you are taking prednisone, you may take Tylenol arthritis strength 650 mg tablets as needed for breakthrough pain or discomfort. Increase fluids and allow for plenty of rest. Please contact [facility] tomorrow to schedule an appointment for rheumatology for further evaluation and continued maintenance and treatment. Follow-up as needed.

## 2022-10-12 NOTE — ED Triage Notes (Signed)
Pt states she is having RA flare up, having pain and swelling in hands and feet that started a few weeks ago. Taking ibuprofen and tylenol.

## 2022-11-02 ENCOUNTER — Ambulatory Visit: Payer: Medicaid Other

## 2022-11-16 ENCOUNTER — Other Ambulatory Visit (HOSPITAL_COMMUNITY): Payer: Self-pay | Admitting: Internal Medicine

## 2022-11-16 DIAGNOSIS — R918 Other nonspecific abnormal finding of lung field: Secondary | ICD-10-CM

## 2022-11-22 ENCOUNTER — Ambulatory Visit
Admission: RE | Admit: 2022-11-22 | Discharge: 2022-11-22 | Disposition: A | Discharge status: Home / Self Care | Payer: Medicaid Other | Source: Ambulatory Visit | Attending: Family Medicine | Admitting: Family Medicine

## 2022-11-22 VITALS — BP 98/81 | HR 91 | Temp 97.4°F | Resp 14

## 2022-11-22 DIAGNOSIS — M069 Rheumatoid arthritis, unspecified: Secondary | ICD-10-CM

## 2022-11-22 MED ORDER — PREDNISONE 20 MG PO TABS: 40.0000 mg | ORAL_TABLET | Freq: Every day | ORAL | 0 refills | Status: DC

## 2022-11-22 MED ORDER — METHYLPREDNISOLONE SODIUM SUCC 125 MG IJ SOLR
80.0000 mg | Freq: Once | INTRAMUSCULAR | Status: AC
  Administered 2022-11-22: 80 mg via INTRAMUSCULAR

## 2022-11-22 NOTE — ED Triage Notes (Signed)
Pt reports she is having a R/A flare up x 3-4 days. States effected areas are feet, hands, and knees. States all are painful.

## 2022-11-22 NOTE — ED Provider Notes (Signed)
[facility]    CSN: [id number] Arrival date & time: 11/22/22  1101      History   Chief Complaint Chief Complaint  Patient presents with   Joint Pain    Entered by patient    HPI Linda Calderon is a 47 y.o. female.   Patient presenting today with 3 to 4-day history of rheumatoid arthritis flareup.  She states her hands, feet and knees are swollen, severely stiff and painful.  She is seen frequently for these flareups as she is not currently followed by rheumatology but notes that her PCP put into referrals to different rheumatologist and attempts to get her in as soon as possible given the severity of her disease.  States no new features from her typical flareups today.    Past Medical History:  Diagnosis Date   ADD (attention deficit disorder)    Arthritis    Chronic neck and back pain    Pneumonia    PTSD (post-traumatic stress disorder)    Radiculopathy    UTI (lower urinary tract infection)     Patient Active Problem List   Diagnosis Date Noted   Controlled substance agreement signed 08/18/2022   Anxiety 07/21/2022   Mixed hyperlipidemia 07/21/2022   Blurred vision, bilateral 07/21/2022   Vision changes 07/21/2022   Cigarette nicotine dependence without complication 05/27/2022   Hypertension 04/26/2022   Rheumatoid arthritis with rheumatoid factor of multiple sites without organ or systems involvement (HCC) 04/03/2022   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)    Pulmonary infiltrates 07/31/2019   Bronchiolitis 07/31/2019   DDD (degenerative disc disease), lumbar 07/31/2019   PTSD (post-traumatic stress disorder) 07/31/2019   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   Smoker 07/31/2019   Rheumatoid arthritis flare (HCC) 11/25/2017   Dizziness 04/05/2017   Encounter for long-term (current) use of high-risk medication 04/05/2017   Seropositive rheumatoid arthritis (HCC) 03/24/2017    Chronic low back pain 09/12/2012   Sciatica of right side 09/12/2012    Past Surgical History:  Procedure Laterality Date   ABDOMINAL HYSTERECTOMY     TUBAL LIGATION     VIDEO BRONCHOSCOPY N/A 08/18/2019   Procedure: FLEXIBLE BRONCHOSCOPY WITH BRONCHOALVEOLAR LAVAGE;  Surgeon: Kalman Shan, MD;  Location: [facility];  Service: Cardiopulmonary;  Laterality: N/A;    OB History     Gravida  8   Para  1   Term  1   Preterm      AB  7   Living  1      SAB  7   IAB      Ectopic      Multiple      Live Births               Home Medications    Prior to Admission medications   Medication Sig Start Date End Date Taking? Authorizing Provider  predniSONE (DELTASONE) 20 MG tablet Take 2 tablets (40 mg total) by mouth daily with breakfast. 11/22/22  Yes Particia Nearing, PA-C  chlorthalidone (HYGROTON) 50 MG tablet Take 0.5 tablets (25 mg total) by mouth daily. 07/21/22   Park Meo, FNP  escitalopram (LEXAPRO) 20 MG tablet Take 1 tablet (20 mg total) by mouth daily. 08/19/22   Park Meo, FNP  Multiple Vitamins-Minerals (MULTIVITAMIN WITH MINERALS) tablet Take 1 tablet by mouth daily.    [provider]  rosuvastatin (CRESTOR) 20 MG tablet Take 1 tablet (20 mg total) by mouth  daily. 07/21/22   Park Meo, FNP    Family History Family History  Problem Relation Age of Onset   Hypertension Mother    Hypertension Father    Cancer Father    Breast cancer Maternal Aunt    Breast cancer Maternal Aunt     Social History Social History   Tobacco Use   Smoking status: Every Day    Current packs/day: 0.50    Average packs/day: 0.5 packs/day for 10.0 years (5.0 ttl pk-yrs)    Types: Cigarettes   Smokeless tobacco: Never  Vaping Use   Vaping status: Never Used  Substance Use Topics   Alcohol use: Yes    Comment: occasionally   Drug use: Yes    Frequency: 5.0 times per week    Types: Marijuana     Allergies   Morphine and  codeine   Review of Systems Review of Systems PER HPI  Physical Exam Triage Vital Signs ED Triage Vitals  Encounter Vitals Group     BP 11/22/22 1110 98/81     Systolic BP Percentile --      Diastolic BP Percentile --      Pulse Rate 11/22/22 1110 91     Resp 11/22/22 1110 14     Temp 11/22/22 1110 (!) 97.4 F (36.3 C)     Temp Source 11/22/22 1110 Oral     SpO2 11/22/22 1110 90 %     Weight --      Height --      Head Circumference --      Peak Flow --      Pain Score 11/22/22 1114 9     Pain Loc --      Pain Education --      Exclude from Growth Chart --    No data found.  Updated Vital Signs BP 98/81 (BP Location: Right Arm)   Pulse 91   Temp (!) 97.4 F (36.3 C) (Oral)   Resp 14   LMP 03/09/2011   SpO2 90%   Visual Acuity Right Eye Distance:   Left Eye Distance:   Bilateral Distance:    Right Eye Near:   Left Eye Near:    Bilateral Near:     Physical Exam Vitals and nursing note reviewed.  Constitutional:      Appearance: Normal appearance. She is not ill-appearing.  HENT:     Head: Atraumatic.  Eyes:     Extraocular Movements: Extraocular movements intact.     Conjunctiva/sclera: Conjunctivae normal.  Cardiovascular:     Rate and Rhythm: Normal rate and regular rhythm.     Heart sounds: Normal heart sounds.  Pulmonary:     Effort: Pulmonary effort is normal.     Breath sounds: Normal breath sounds.  Musculoskeletal:        General: Swelling present.     Cervical back: Normal range of motion and neck supple.     Comments: Decreased range of motion to fingers, swelling and chronic deformities to hands feet and knees from rheumatoid arthritis  Skin:    General: Skin is warm and dry.  Neurological:     Mental Status: She is alert and oriented to person, place, and time.     Comments: All 4 extremities neurovascularly intact  Psychiatric:        Mood and Affect: Mood normal.        Thought Content: Thought content normal.        Judgment:  Judgment normal.  UC Treatments / Results  Labs (all labs ordered are listed, but only abnormal results are displayed) Labs Reviewed - No data to display  EKG   Radiology No results found.  Procedures Procedures (including critical care time)  Medications Ordered in UC Medications  methylPREDNISolone sodium succinate (SOLU-MEDROL) 125 mg/2 mL injection 80 mg (has no administration in time range)    Initial Impression / Assessment and Plan / UC Course  I have reviewed the triage vital signs and the nursing notes.  Pertinent labs & imaging results that were available during my care of the patient were reviewed by me and considered in my medical decision making (see chart for details).     She with IM Solu-Medrol, prednisone, close PCP follow-up for recheck.  Awaiting rheumatology establish care Final Clinical Impressions(s) / UC Diagnoses   Final diagnoses:  Rheumatoid arthritis flare [facility])   Discharge Instructions   None    ED Prescriptions     Medication Sig Dispense Auth. Provider   predniSONE (DELTASONE) 20 MG tablet Take 2 tablets (40 mg total) by mouth daily with breakfast. 10 tablet Particia Nearing, [place]      PDMP not reviewed this encounter.   Particia Nearing, [place] 11/22/22 1133

## 2022-12-04 ENCOUNTER — Ambulatory Visit: Payer: Medicaid Other

## 2022-12-04 ENCOUNTER — Ambulatory Visit
Admission: EM | Admit: 2022-12-04 | Discharge: 2022-12-04 | Disposition: A | Discharge status: Home / Self Care | Payer: Medicaid Other | Attending: Family Medicine | Admitting: Family Medicine

## 2022-12-04 DIAGNOSIS — J069 Acute upper respiratory infection, unspecified: Secondary | ICD-10-CM

## 2022-12-04 LAB — POC COVID19/FLU A&B COMBO
Covid Antigen, POC: NEGATIVE
Influenza A Antigen, POC: NEGATIVE
Influenza B Antigen, POC: NEGATIVE

## 2022-12-04 LAB — POCT RAPID STREP A (OFFICE): Rapid Strep A Screen: NEGATIVE

## 2022-12-04 MED ORDER — PROMETHAZINE-DM 6.25-15 MG/5ML PO SYRP: 5.0000 mL | ORAL_SOLUTION | Freq: Four times a day (QID) | ORAL | 0 refills | Status: DC | PRN

## 2022-12-04 MED ORDER — FLUTICASONE PROPIONATE 50 MCG/ACT NA SUSP: 1.0000 | Freq: Two times a day (BID) | NASAL | 2 refills | Status: AC

## 2022-12-04 NOTE — ED Provider Notes (Signed)
[facility]    CSN: [id number] Arrival date & time: 12/04/22  1208      History   Chief Complaint No chief complaint on file.   HPI Linda Calderon is a 47 y.o. female.   Presenting today with 2-day history of sore throat, fever, chills, body aches, sweats, cough, congestion.  Denies chest pain, shortness of breath, abdominal pain, nausea vomiting or diarrhea.  So far not tried anything over-the-counter for symptoms.  No known sick contacts recently.    Past Medical History:  Diagnosis Date   ADD (attention deficit disorder)    Arthritis    Chronic neck and back pain    Pneumonia    PTSD (post-traumatic stress disorder)    Radiculopathy    UTI (lower urinary tract infection)     Patient Active Problem List   Diagnosis Date Noted   Controlled substance agreement signed 08/18/2022   Anxiety 07/21/2022   Mixed hyperlipidemia 07/21/2022   Blurred vision, bilateral 07/21/2022   Vision changes 07/21/2022   Cigarette nicotine dependence without complication 05/27/2022   Hypertension 04/26/2022   Rheumatoid arthritis with rheumatoid factor of multiple sites without organ or systems involvement (HCC) 04/03/2022   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)    Pulmonary infiltrates 07/31/2019   Bronchiolitis 07/31/2019   DDD (degenerative disc disease), lumbar 07/31/2019   PTSD (post-traumatic stress disorder) 07/31/2019   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   Smoker 07/31/2019   Rheumatoid arthritis flare (HCC) 11/25/2017   Dizziness 04/05/2017   Encounter for long-term (current) use of high-risk medication 04/05/2017   Seropositive rheumatoid arthritis (HCC) 03/24/2017   Chronic low back pain 09/12/2012   Sciatica of right side 09/12/2012    Past Surgical History:  Procedure Laterality Date   ABDOMINAL HYSTERECTOMY     TUBAL LIGATION     VIDEO BRONCHOSCOPY N/A 08/18/2019   Procedure:  FLEXIBLE BRONCHOSCOPY WITH BRONCHOALVEOLAR LAVAGE;  Surgeon: Kalman Shan, MD;  Location: [facility];  Service: Cardiopulmonary;  Laterality: N/A;    OB History     Gravida  8   Para  1   Term  1   Preterm      AB  7   Living  1      SAB  7   IAB      Ectopic      Multiple      Live Births               Home Medications    Prior to Admission medications   Medication Sig Start Date End Date Taking? Authorizing Provider  fluticasone (FLONASE) 50 MCG/ACT nasal spray Place 1 spray into both nostrils 2 (two) times daily. 12/04/22  Yes Particia Nearing, PA-C  promethazine-dextromethorphan (PROMETHAZINE-DM) 6.25-15 MG/5ML syrup Take 5 mLs by mouth 4 (four) times daily as needed. 12/04/22  Yes Particia Nearing, PA-C  chlorthalidone (HYGROTON) 50 MG tablet Take 0.5 tablets (25 mg total) by mouth daily. 07/21/22   Park Meo, FNP  escitalopram (LEXAPRO) 20 MG tablet Take 1 tablet (20 mg total) by mouth daily. 08/19/22   Park Meo, FNP  Multiple Vitamins-Minerals (MULTIVITAMIN WITH MINERALS) tablet Take 1 tablet by mouth daily.    [provider]  predniSONE (DELTASONE) 20 MG tablet Take 2 tablets (40 mg total) by mouth daily with breakfast. 11/22/22   Particia Nearing, PA-C  rosuvastatin (CRESTOR) 20 MG tablet Take 1 tablet (20 mg total) by mouth  daily. 07/21/22   Park Meo, FNP    Family History Family History  Problem Relation Age of Onset   Hypertension Mother    Hypertension Father    Cancer Father    Breast cancer Maternal Aunt    Breast cancer Maternal Aunt     Social History Social History   Tobacco Use   Smoking status: Every Day    Current packs/day: 0.50    Average packs/day: 0.5 packs/day for 10.0 years (5.0 ttl pk-yrs)    Types: Cigarettes   Smokeless tobacco: Never  Vaping Use   Vaping status: Never Used  Substance Use Topics   Alcohol use: Yes    Comment: occasionally   Drug use: Yes     Frequency: 5.0 times per week    Types: Marijuana     Allergies   Morphine and codeine   Review of Systems Review of Systems Per HPI  Physical Exam Triage Vital Signs ED Triage Vitals  Encounter Vitals Group     BP 12/04/22 1224 109/81     Systolic BP Percentile --      Diastolic BP Percentile --      Pulse Rate 12/04/22 1224 67     Resp 12/04/22 1224 14     Temp 12/04/22 1224 97.6 F (36.4 C)     Temp Source 12/04/22 1224 Oral     SpO2 12/04/22 1224 95 %     Weight --      Height --      Head Circumference --      Peak Flow --      Pain Score 12/04/22 1225 4     Pain Loc --      Pain Education --      Exclude from Growth Chart --    No data found.  Updated Vital Signs BP 109/81 (BP Location: Right Arm)   Pulse 67   Temp 97.6 F (36.4 C) (Oral)   Resp 14   LMP 03/09/2011   SpO2 95%   Visual Acuity Right Eye Distance:   Left Eye Distance:   Bilateral Distance:    Right Eye Near:   Left Eye Near:    Bilateral Near:     Physical Exam Vitals and nursing note reviewed.  Constitutional:      Appearance: Normal appearance.  HENT:     Head: Atraumatic.     Right Ear: Tympanic membrane and external ear normal.     Left Ear: Tympanic membrane and external ear normal.     Nose: Rhinorrhea present.     Mouth/Throat:     Mouth: Mucous membranes are moist.     Pharynx: Posterior oropharyngeal erythema present.  Eyes:     Extraocular Movements: Extraocular movements intact.     Conjunctiva/sclera: Conjunctivae normal.  Cardiovascular:     Rate and Rhythm: Normal rate and regular rhythm.     Heart sounds: Normal heart sounds.  Pulmonary:     Effort: Pulmonary effort is normal.     Breath sounds: Normal breath sounds. No wheezing or rales.  Musculoskeletal:        General: Normal range of motion.     Cervical back: Normal range of motion and neck supple.  Skin:    General: Skin is warm and dry.  Neurological:     Mental Status: She is alert and  oriented to person, place, and time.  Psychiatric:        Mood and Affect: Mood normal.  Thought Content: Thought content normal.      UC Treatments / Results  Labs (all labs ordered are listed, but only abnormal results are displayed) Labs Reviewed  POCT RAPID STREP A (OFFICE)  POC COVID19/FLU A&B COMBO    EKG   Radiology No results found.  Procedures Procedures (including critical care time)  Medications Ordered in UC Medications - No data to display  Initial Impression / Assessment and Plan / UC Course  I have reviewed the triage vital signs and the nursing notes.  Pertinent labs & imaging results that were available during my care of the patient were reviewed by me and considered in my medical decision making (see chart for details).     Vitals and exam reassuring today and suggestive of a viral respiratory infection.  Rapid strep, rapid COVID and flu negative.  Discussed Phenergan DM, Flonase, supportive over-the-counter medications and home care.  Return for worsening symptoms.  Work note given.  Final Clinical Impressions(s) / UC Diagnoses   Final diagnoses:  Viral URI with cough   Discharge Instructions   None    ED Prescriptions     Medication Sig Dispense Auth. Provider   promethazine-dextromethorphan (PROMETHAZINE-DM) 6.25-15 MG/5ML syrup Take 5 mLs by mouth 4 (four) times daily as needed. 100 mL Particia Nearing, PA-C   fluticasone [facility]) 50 MCG/ACT nasal spray Place 1 spray into both nostrils 2 (two) times daily. 16 g Particia Nearing, [place]      PDMP not reviewed this encounter.   Particia Nearing, [place] 12/04/22 1332

## 2022-12-04 NOTE — ED Triage Notes (Signed)
Pt reports she has a sore throat, sweats, fever, chills, and cough x 2 days   Took tylenol but no relief.

## 2022-12-25 ENCOUNTER — Other Ambulatory Visit: Payer: Self-pay

## 2022-12-25 ENCOUNTER — Ambulatory Visit
Admission: RE | Admit: 2022-12-25 | Discharge: 2022-12-25 | Disposition: A | Discharge status: Home / Self Care | Payer: Medicaid Other | Source: Ambulatory Visit | Attending: Nurse Practitioner | Admitting: Nurse Practitioner

## 2022-12-25 VITALS — BP 119/85 | HR 88 | Temp 98.4°F | Resp 16

## 2022-12-25 DIAGNOSIS — M069 Rheumatoid arthritis, unspecified: Secondary | ICD-10-CM

## 2022-12-25 MED ORDER — METHYLPREDNISOLONE ACETATE 40 MG/ML IJ SUSP
40.0000 mg | Freq: Once | INTRAMUSCULAR | Status: AC
  Administered 2022-12-25: 40 mg via INTRAMUSCULAR

## 2022-12-25 MED ORDER — PREDNISONE 20 MG PO TABS: 40.0000 mg | ORAL_TABLET | Freq: Every day | ORAL | 0 refills | Status: AC

## 2022-12-25 MED ORDER — TIZANIDINE HCL 2 MG PO TABS: 2.0000 mg | ORAL_TABLET | Freq: Three times a day (TID) | ORAL | 0 refills | Status: AC | PRN

## 2022-12-25 NOTE — Discharge Instructions (Signed)
We gave you a shot of steroid today.  Start taking the oral prednisone tomorrow.  In addition, continue Tylenol 500 to 1000 mg every 6 hours for pain and take the tizanidine as needed for muscular pain.  Follow-up with rheumatology as planned.

## 2022-12-25 NOTE — ED Triage Notes (Signed)
Pt reports flare-up of RA x2 days. Reports right shoulder and right hip pain. Reports seems rheumatologist in January.

## 2022-12-25 NOTE — ED Provider Notes (Signed)
[facility]    CSN: [id number] Arrival date & time: 12/25/22  1532      History   Chief Complaint Chief Complaint  Patient presents with   Extremity Pain    RA flare - Entered by patient    HPI Linda Calderon is a 47 y.o. female.   Patient presents today for rheumatoid arthritis flare.  Reports began 2 days ago, she has chronic deformities and swelling to her wrists, hands, and fingers.  Reports she developed right shoulder and right hip pain over the past couple of days and was unable to sleep last night.  Reports she has an appointment with a new rheumatologist in January.  She has been seen multiple times for the flares in urgent care and states his flare is no different.  Has taken Tylenol for the pain without relief.    Past Medical History:  Diagnosis Date   ADD (attention deficit disorder)    Arthritis    Chronic neck and back pain    Pneumonia    PTSD (post-traumatic stress disorder)    Radiculopathy    UTI (lower urinary tract infection)     Patient Active Problem List   Diagnosis Date Noted   Controlled substance agreement signed 08/18/2022   Anxiety 07/21/2022   Mixed hyperlipidemia 07/21/2022   Blurred vision, bilateral 07/21/2022   Vision changes 07/21/2022   Cigarette nicotine dependence without complication 05/27/2022   Hypertension 04/26/2022   Rheumatoid arthritis with rheumatoid factor of multiple sites without organ or systems involvement (HCC) 04/03/2022   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)    Pulmonary infiltrates 07/31/2019   Bronchiolitis 07/31/2019   DDD (degenerative disc disease), lumbar 07/31/2019   PTSD (post-traumatic stress disorder) 07/31/2019   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   Smoker 07/31/2019   Rheumatoid arthritis flare (HCC) 11/25/2017   Dizziness 04/05/2017   Encounter for long-term (current) use of high-risk medication 04/05/2017    Seropositive rheumatoid arthritis (HCC) 03/24/2017   Chronic low back pain 09/12/2012   Sciatica of right side 09/12/2012    Past Surgical History:  Procedure Laterality Date   ABDOMINAL HYSTERECTOMY     TUBAL LIGATION     VIDEO BRONCHOSCOPY N/A 08/18/2019   Procedure: FLEXIBLE BRONCHOSCOPY WITH BRONCHOALVEOLAR LAVAGE;  Surgeon: Kalman Shan, MD;  Location: [facility];  Service: Cardiopulmonary;  Laterality: N/A;    OB History     Gravida  8   Para  1   Term  1   Preterm      AB  7   Living  1      SAB  7   IAB      Ectopic      Multiple      Live Births               Home Medications    Prior to Admission medications   Medication Sig Start Date End Date Taking? Authorizing Provider  tiZANidine (ZANAFLEX) 2 MG tablet Take 1 tablet (2 mg total) by mouth every 8 (eight) hours as needed for muscle spasms. Do not take with alcohol or while driving or operating heavy machinery.  May cause drowsiness. 12/25/22  Yes Valentino Nose, NP  chlorthalidone (HYGROTON) 50 MG tablet Take 0.5 tablets (25 mg total) by mouth daily. 07/21/22   Park Meo, FNP  escitalopram (LEXAPRO) 20 MG tablet Take 1 tablet (20 mg total) by mouth daily. 08/19/22   Dimas Aguas, [facility]  S, FNP  fluticasone (FLONASE) 50 MCG/ACT nasal spray Place 1 spray into both nostrils 2 (two) times daily. 12/04/22   Particia Nearing, PA-C  Multiple Vitamins-Minerals (MULTIVITAMIN WITH MINERALS) tablet Take 1 tablet by mouth daily.    [provider]  predniSONE (DELTASONE) 20 MG tablet Take 2 tablets (40 mg total) by mouth daily with breakfast for 5 days. 12/25/22 12/30/22  Valentino Nose, NP  rosuvastatin (CRESTOR) 20 MG tablet Take 1 tablet (20 mg total) by mouth daily. 07/21/22   Park Meo, FNP    Family History Family History  Problem Relation Age of Onset   Hypertension Mother    Hypertension Father    Cancer Father    Breast cancer Maternal Aunt    Breast cancer  Maternal Aunt     Social History Social History   Tobacco Use   Smoking status: Every Day    Current packs/day: 0.50    Average packs/day: 0.5 packs/day for 10.0 years (5.0 ttl pk-yrs)    Types: Cigarettes   Smokeless tobacco: Never  Vaping Use   Vaping status: Never Used  Substance Use Topics   Alcohol use: Yes    Comment: occasionally   Drug use: Yes    Frequency: 5.0 times per week    Types: Marijuana     Allergies   Morphine and codeine   Review of Systems Review of Systems Per HPI  Physical Exam Triage Vital Signs ED Triage Vitals  Encounter Vitals Group     BP 12/25/22 1635 119/85     Systolic BP Percentile --      Diastolic BP Percentile --      Pulse Rate 12/25/22 1635 88     Resp 12/25/22 1635 16     Temp 12/25/22 1635 98.4 F (36.9 C)     Temp Source 12/25/22 1635 Oral     SpO2 12/25/22 1635 93 %     Weight --      Height --      Head Circumference --      Peak Flow --      Pain Score 12/25/22 1634 8     Pain Loc --      Pain Education --      Exclude from Growth Chart --    No data found.  Updated Vital Signs BP 119/85 (BP Location: Right Arm)   Pulse 88   Temp 98.4 F (36.9 C) (Oral)   Resp 16   LMP 03/09/2011   SpO2 93%   Visual Acuity Right Eye Distance:   Left Eye Distance:   Bilateral Distance:    Right Eye Near:   Left Eye Near:    Bilateral Near:     Physical Exam Vitals and nursing note reviewed.  Constitutional:      General: She is not in acute distress.    Appearance: Normal appearance. She is not toxic-appearing.  HENT:     Mouth/Throat:     Mouth: Mucous membranes are moist.     Pharynx: Oropharynx is clear.  Pulmonary:     Effort: Pulmonary effort is normal. No respiratory distress.  Musculoskeletal:     Right wrist: Swelling, tenderness and bony tenderness present. No effusion. Normal range of motion. Normal pulse.     Left wrist: Swelling, tenderness and bony tenderness present. No effusion. Normal range  of motion. Normal pulse.     Right hand: Deformity, tenderness and bony tenderness present. No swelling. Decreased range of motion. Normal  sensation. Normal capillary refill.     Left hand: Deformity, tenderness and bony tenderness present. No swelling. Decreased range of motion. Normal sensation. Normal capillary refill.     Comments: Patient moving all 4 extremities equally and without difficulty.  She is neurovascularly intact in all 4 distal extremities.  Skin:    General: Skin is warm and dry.     Capillary Refill: Capillary refill takes less than 2 seconds.     Coloration: Skin is not jaundiced or pale.     Findings: No erythema.  Neurological:     Mental Status: She is alert and oriented to person, place, and time.  Psychiatric:        Behavior: Behavior is cooperative.      UC Treatments / Results  Labs (all labs ordered are listed, but only abnormal results are displayed) Labs Reviewed - No data to display  EKG   Radiology No results found.  Procedures Procedures (including critical care time)  Medications Ordered in UC Medications  methylPREDNISolone acetate (DEPO-MEDROL) injection 40 mg (has no administration in time range)    Initial Impression / Assessment and Plan / UC Course  I have reviewed the triage vital signs and the nursing notes.  Pertinent labs & imaging results that were available during my care of the patient were reviewed by me and considered in my medical decision making (see chart for details).   Patient is well-appearing, normotensive, afebrile, not tachycardic, not tachypneic, oxygenating well on room air.    1. Flare of rheumatoid arthritis (HCC) No red flags Treat with Depo-Medrol 40 mg IM, start oral prednisone tomorrow Muscle relaxant sent per patient's request Recommended close follow-up with rheumatology as planned  The patient was given the opportunity to ask questions.  All questions answered to their satisfaction.  The patient is  in agreement to this plan.    Final Clinical Impressions(s) / UC Diagnoses   Final diagnoses:  Flare of rheumatoid arthritis [facility])     Discharge Instructions      We gave you a shot of steroid today.  Start taking the oral prednisone tomorrow.  In addition, continue Tylenol 500 to 1000 mg every 6 hours for pain and take the tizanidine as needed for muscular pain.  Follow-up with rheumatology as planned.    ED Prescriptions     Medication Sig Dispense Auth. Provider   predniSONE (DELTASONE) 20 MG tablet Take 2 tablets (40 mg total) by mouth daily with breakfast for 5 days. 10 tablet Cathlean Marseilles A, NP   tiZANidine (ZANAFLEX) 2 MG tablet Take 1 tablet (2 mg total) by mouth every 8 (eight) hours as needed for muscle spasms. Do not take with alcohol or while driving or operating heavy machinery.  May cause drowsiness. 20 tablet Valentino Nose, NP      PDMP not reviewed this encounter.   Valentino Nose, NP 12/25/22 [phone]

## 2023-01-19 IMAGING — DX DG FOOT COMPLETE 3+V*R*
3 series · 3 of 3 positions shown · non-contrast
Comparison: Right foot radiographs 07/01/2019

CLINICAL DATA: Pain and swelling and first metatarsophalangeal
joint after rolling foot 3 days ago.

EXAM:
RIGHT FOOT COMPLETE - 3+ VIEW

[foot ap]
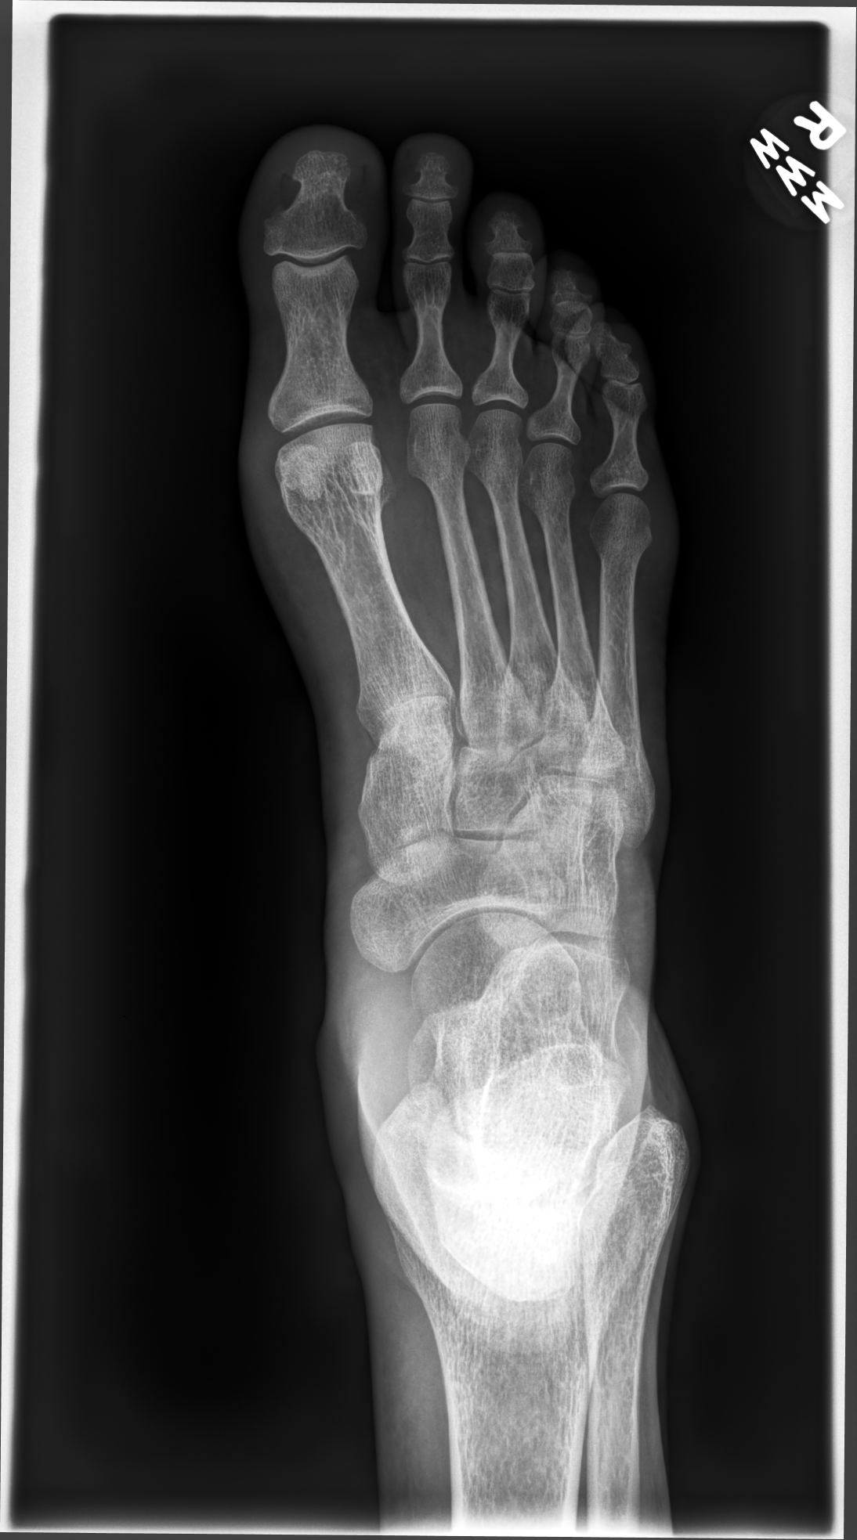

[foot mlo]
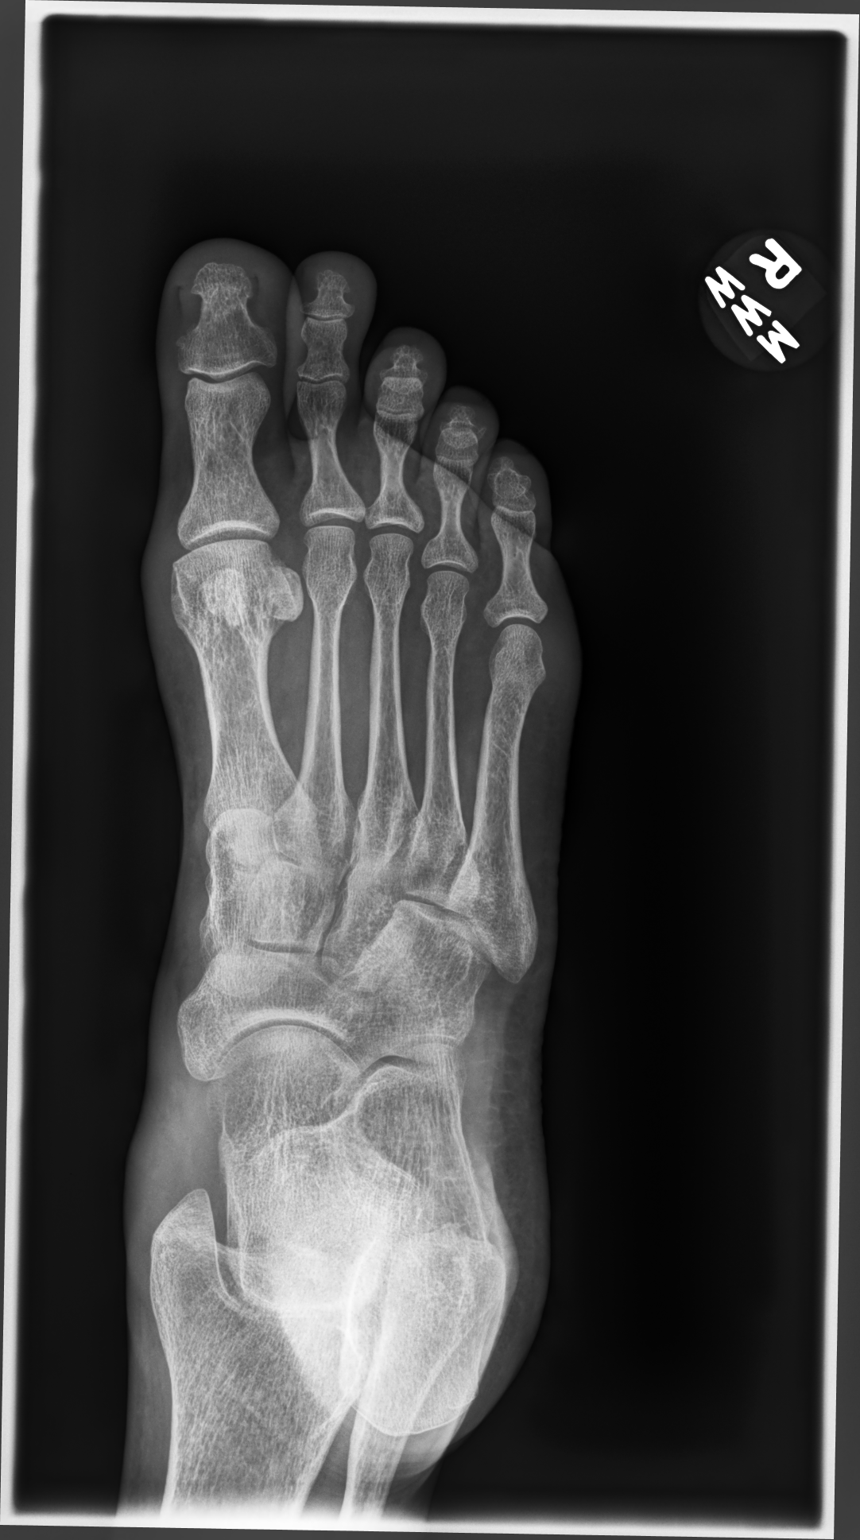

[foot lat]
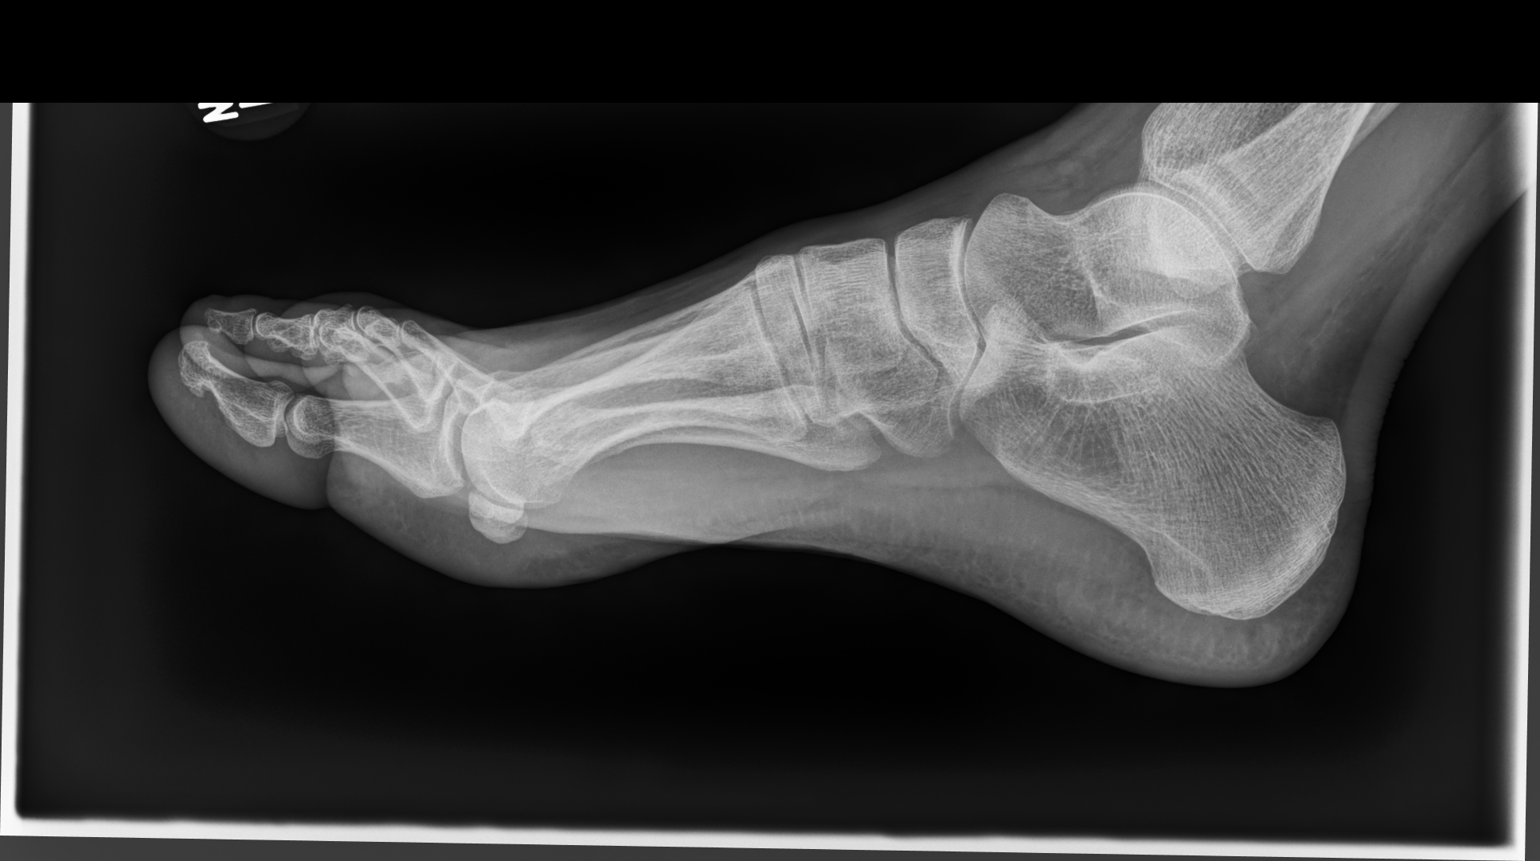

[3 of 3 positions shown; findings below may reference images not displayed]

FINDINGS: Minimal lateral great toe metatarsophalangeal joint peripheral
degenerative spurring, unchanged. Mild soft tissue swelling around
the great toe metatarsophalangeal joint. Joint spaces are
maintained. No acute fracture is seen. No dislocation. The cortices
are intact.
IMPRESSION: 1. Mild soft tissue swelling around the great toe
metatarsophalangeal joint. No acute fracture.
2. Minimal degenerative spurring of the great toe
metatarsophalangeal joint, unchanged.

## 2023-01-25 ENCOUNTER — Other Ambulatory Visit: Payer: Self-pay | Admitting: Family Medicine

## 2023-01-26 ENCOUNTER — Ambulatory Visit: Payer: Medicaid Other

## 2023-03-08 ENCOUNTER — Ambulatory Visit: Payer: Medicaid Other

## 2023-03-15 ENCOUNTER — Other Ambulatory Visit: Payer: Self-pay | Admitting: Internal Medicine

## 2023-03-15 DIAGNOSIS — M069 Rheumatoid arthritis, unspecified: Secondary | ICD-10-CM

## 2023-03-15 DIAGNOSIS — M797 Fibromyalgia: Secondary | ICD-10-CM

## 2023-03-19 ENCOUNTER — Ambulatory Visit (HOSPITAL_COMMUNITY)
Admission: RE | Admit: 2023-03-19 | Discharge: 2023-03-19 | Disposition: A | Discharge status: Home / Self Care | Payer: Medicaid Other | Source: Ambulatory Visit | Attending: Internal Medicine | Admitting: Internal Medicine

## 2023-03-19 DIAGNOSIS — R918 Other nonspecific abnormal finding of lung field: Secondary | ICD-10-CM

## 2023-03-25 ENCOUNTER — Ambulatory Visit
Admission: RE | Admit: 2023-03-25 | Discharge: 2023-03-25 | Disposition: A | Discharge status: Home / Self Care | Source: Ambulatory Visit | Attending: Internal Medicine | Admitting: Internal Medicine

## 2023-03-25 DIAGNOSIS — M797 Fibromyalgia: Secondary | ICD-10-CM | POA: Insufficient documentation

## 2023-03-25 DIAGNOSIS — M069 Rheumatoid arthritis, unspecified: Secondary | ICD-10-CM

## 2023-05-06 ENCOUNTER — Other Ambulatory Visit: Payer: Self-pay | Admitting: Family Medicine

## 2023-08-19 ENCOUNTER — Other Ambulatory Visit (HOSPITAL_COMMUNITY): Payer: Self-pay | Admitting: Internal Medicine

## 2023-08-19 DIAGNOSIS — Z1231 Encounter for screening mammogram for malignant neoplasm of breast: Secondary | ICD-10-CM

## 2023-08-25 ENCOUNTER — Ambulatory Visit (HOSPITAL_COMMUNITY)
Admission: RE | Admit: 2023-08-25 | Discharge: 2023-08-25 | Disposition: A | Discharge status: Home / Self Care | Source: Ambulatory Visit | Attending: Internal Medicine | Admitting: Internal Medicine

## 2023-08-25 DIAGNOSIS — Z1231 Encounter for screening mammogram for malignant neoplasm of breast: Secondary | ICD-10-CM | POA: Insufficient documentation

## 2023-09-02 ENCOUNTER — Other Ambulatory Visit (HOSPITAL_COMMUNITY): Payer: Self-pay | Admitting: Internal Medicine

## 2023-09-02 DIAGNOSIS — R928 Other abnormal and inconclusive findings on diagnostic imaging of breast: Secondary | ICD-10-CM

## 2023-09-07 ENCOUNTER — Ambulatory Visit (HOSPITAL_COMMUNITY)
Admission: RE | Admit: 2023-09-07 | Discharge: 2023-09-07 | Disposition: A | Discharge status: Home / Self Care | Source: Ambulatory Visit | Attending: Internal Medicine | Admitting: Internal Medicine

## 2023-09-07 DIAGNOSIS — R928 Other abnormal and inconclusive findings on diagnostic imaging of breast: Secondary | ICD-10-CM | POA: Insufficient documentation

## 2023-10-01 ENCOUNTER — Encounter: Payer: Self-pay | Admitting: Oncology

## 2023-10-01 ENCOUNTER — Inpatient Hospital Stay: Attending: Oncology | Admitting: Oncology

## 2023-10-01 ENCOUNTER — Inpatient Hospital Stay

## 2023-10-01 VITALS — BP 117/81 | HR 88 | Temp 97.2°F | Resp 18 | Ht 64.0 in | Wt 100.4 lb

## 2023-10-01 DIAGNOSIS — R233 Spontaneous ecchymoses: Secondary | ICD-10-CM | POA: Diagnosis not present

## 2023-10-01 DIAGNOSIS — D649 Anemia, unspecified: Secondary | ICD-10-CM | POA: Diagnosis not present

## 2023-10-01 DIAGNOSIS — D72828 Other elevated white blood cell count: Secondary | ICD-10-CM

## 2023-10-01 DIAGNOSIS — F17210 Nicotine dependence, cigarettes, uncomplicated: Secondary | ICD-10-CM | POA: Insufficient documentation

## 2023-10-01 DIAGNOSIS — R5383 Other fatigue: Secondary | ICD-10-CM | POA: Insufficient documentation

## 2023-10-01 DIAGNOSIS — M069 Rheumatoid arthritis, unspecified: Secondary | ICD-10-CM | POA: Insufficient documentation

## 2023-10-01 DIAGNOSIS — D72829 Elevated white blood cell count, unspecified: Secondary | ICD-10-CM | POA: Diagnosis not present

## 2023-10-01 DIAGNOSIS — Z803 Family history of malignant neoplasm of breast: Secondary | ICD-10-CM | POA: Insufficient documentation

## 2023-10-01 LAB — IRON AND TIBC
Iron: 53 ug/dL (ref 28–170)
Saturation Ratios: 10 % — ABNORMAL LOW (ref 10.4–31.8)
TIBC: 522 ug/dL — ABNORMAL HIGH (ref 250–450)
UIBC: 469 ug/dL

## 2023-10-01 LAB — CBC WITH DIFFERENTIAL/PLATELET
Abs Immature Granulocytes: 0.09 K/uL — ABNORMAL HIGH (ref 0.00–0.07)
Basophils Absolute: 0.1 K/uL (ref 0.0–0.1)
Basophils Relative: 1 %
Eosinophils Absolute: 0 K/uL (ref 0.0–0.5)
Eosinophils Relative: 0 %
HCT: 40 % (ref 36.0–46.0)
Hemoglobin: 12.7 g/dL (ref 12.0–15.0)
Immature Granulocytes: 1 %
Lymphocytes Relative: 14 %
Lymphs Abs: 1.9 K/uL (ref 0.7–4.0)
MCH: 30.2 pg (ref 26.0–34.0)
MCHC: 31.8 g/dL (ref 30.0–36.0)
MCV: 95.2 fL (ref 80.0–100.0)
Monocytes Absolute: 0.4 K/uL (ref 0.1–1.0)
Monocytes Relative: 3 %
Neutro Abs: 10.9 K/uL — ABNORMAL HIGH (ref 1.7–7.7)
Neutrophils Relative %: 81 %
Platelets: 473 K/uL — ABNORMAL HIGH (ref 150–400)
RBC: 4.2 MIL/uL (ref 3.87–5.11)
RDW: 14.6 % (ref 11.5–15.5)
WBC: 13.4 K/uL — ABNORMAL HIGH (ref 4.0–10.5)
nRBC: 0 % (ref 0.0–0.2)

## 2023-10-01 LAB — PROTIME-INR
INR: 0.9 (ref 0.8–1.2)
Prothrombin Time: 12.6 s (ref 11.4–15.2)

## 2023-10-01 LAB — FERRITIN: Ferritin: 13 ng/mL (ref 11–307)

## 2023-10-01 LAB — APTT: aPTT: 36 s (ref 24–36)

## 2023-10-01 LAB — VITAMIN B12: Vitamin B-12: 513 pg/mL (ref 180–914)

## 2023-10-01 NOTE — Progress Notes (Unsigned)
 Patient is a new patient, her referral is from Dr. Katrinka for Leukocytosis.

## 2023-10-01 NOTE — Progress Notes (Unsigned)
 Hematology/Oncology Consult note [facility] [phone] [phone]  Patient Care Team: [facility] as PCP - General   Name of the patient: Linda Calderon  [id number]  [birth year]    Reason for referral-leukocytosis   Referring physician-Dr. Karna Lukes  Date of visit: 10/01/23   History of presenting illness- Patient is a 48 year old female with a past medical history significant for rheumatoid arthritis hypertension hyperlipidemia PTSD and anxiety referred for leukocytosis.  Labs from 09/17/2023 showed white count of 11.8, H&H of 11/34.8 with an MCV of 94.6 and a platelet count of 447.  Differential mainly showed neutrophilia.  BMP was within normal limits.  Looking back at her prior CBCs patient's white count was 12.5 in February 2025 with differential mainly showing neutrophilia and relative lymphopenia and monocytopenia  Patient has significant joint pain from her rheumatoid arthritis.  She reports having occasional bruising especially over her bilateral legs which come and go without any apparent trauma.  ECOG PS- 1  Pain scale- 5   Review of systems- Review of Systems  Constitutional:  Positive for malaise/fatigue. Negative for chills, fever and weight loss.  HENT:  Negative for congestion, ear discharge and nosebleeds.   Eyes:  Negative for blurred vision.  Respiratory:  Negative for cough, hemoptysis, sputum production, shortness of breath and wheezing.   Cardiovascular:  Negative for chest pain, palpitations, orthopnea and claudication.  Gastrointestinal:  Negative for abdominal pain, blood in stool, constipation, diarrhea, heartburn, melena, nausea and vomiting.  Genitourinary:  Negative for dysuria, flank pain, frequency, hematuria and urgency.  Musculoskeletal:  Positive for joint pain. Negative for back pain and myalgias.  Skin:  Negative for rash.  Neurological:  Negative for dizziness, tingling,  focal weakness, seizures, weakness and headaches.  Endo/Heme/Allergies:  Bruises/bleeds easily.  Psychiatric/Behavioral:  Negative for depression and suicidal ideas. The patient does not have insomnia.     Allergies  Allergen Reactions   Morphine And Codeine      Dropped blood pressure, had to lay patient flat and administer IV    Patient Active Problem List   Diagnosis Date Noted   Controlled substance agreement signed 08/18/2022   Anxiety 07/21/2022   Mixed hyperlipidemia 07/21/2022   Blurred vision, bilateral 07/21/2022   Vision changes 07/21/2022   Cigarette nicotine  dependence without complication 05/27/2022   Hypertension 04/26/2022   Rheumatoid arthritis with rheumatoid factor of multiple sites without organ or systems involvement (HCC) 04/03/2022   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)    Pulmonary infiltrates 07/31/2019   Bronchiolitis 07/31/2019   DDD (degenerative disc disease), lumbar 07/31/2019   PTSD (post-traumatic stress disorder) 07/31/2019   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   Smoker 07/31/2019   Rheumatoid arthritis flare (HCC) 11/25/2017   Dizziness 04/05/2017   Encounter for long-term (current) use of high-risk medication 04/05/2017   Seropositive rheumatoid arthritis (HCC) 03/24/2017   Chronic low back pain 09/12/2012   Sciatica of right side 09/12/2012     Past Medical History:  Diagnosis Date   ADD (attention deficit disorder)    Arthritis    Chronic neck and back pain    Pneumonia    PTSD (post-traumatic stress disorder)    Radiculopathy    UTI (lower urinary tract infection)      Past Surgical History:  Procedure Laterality Date   ABDOMINAL HYSTERECTOMY     TUBAL LIGATION     VIDEO BRONCHOSCOPY N/A 08/18/2019   Procedure: FLEXIBLE BRONCHOSCOPY  WITH BRONCHOALVEOLAR LAVAGE;  Surgeon: Geronimo Amel, MD;  Location: [facility];  Service: Cardiopulmonary;  Laterality:  N/A;    Social History   Socioeconomic History   Marital status: Single    Spouse name: Not on file   Number of children: Not on file   Years of education: Not on file   Highest education level: 11th grade  Occupational History   Not on file  Tobacco Use   Smoking status: Every Day    Current packs/day: 0.50    Average packs/day: 0.5 packs/day for 10.0 years (5.0 ttl pk-yrs)    Types: Cigarettes   Smokeless tobacco: Never  Vaping Use   Vaping status: Never Used  Substance and Sexual Activity   Alcohol use: Yes    Comment: occasionally   Drug use: Yes    Frequency: 5.0 times per week    Types: Marijuana   Sexual activity: Yes    Birth control/protection: Surgical  Other Topics Concern   Not on file  Social History Narrative   Not on file   Social Drivers of Health   Financial Resource Strain: Medium Risk (05/24/2023)   Received from [facility]   Overall Financial Resource Strain (CARDIA)    Difficulty of Paying Living Expenses: Somewhat hard  Food Insecurity: Food Insecurity Present (05/24/2023)   Received from [facility]   Hunger Vital Sign    Within the past 12 months, you worried that your food would run out before you got the money to buy more.: Sometimes true    Within the past 12 months, the food you bought just didn't last and you didn't have money to get more.: Never true  Transportation Needs: No Transportation Needs (05/24/2023)   Received from [facility] - Transportation    In the past 12 months, has lack of transportation kept you from medical appointments or from getting medications?: No    Lack of Transportation (Non-Medical): No  Physical Activity: Inactive (03/08/2023)   Received from [facility]   Exercise Vital Sign    On average, how many days per week do you engage in moderate to strenuous exercise (like a brisk walk)?: 0 days    On average, how many minutes do you  engage in exercise at this level?: 0 min  Stress: Stress Concern Present (03/08/2023)   Received from [facility] - Occupational Stress Questionnaire    Feeling of Stress : Very much  Social Connections: Socially Isolated (03/08/2023)   Received from [facility]   Social Connection and Isolation Panel    In a typical week, how many times do you talk on the phone with family, friends, or neighbors?: Once a week    How often do you get together with friends or relatives?: Never    How often do you attend church or religious services?: 1 to 4 times per year    Do you belong to any clubs or organizations such as church groups, unions, fraternal or athletic groups, or school groups?: No    How often do you attend meetings of the clubs or organizations you belong to?: Never    Are you married, widowed, divorced, separated, never married, or living with a partner?: Divorced  Intimate Partner Violence: Not on file     Family History  Problem Relation Age of Onset   Hypertension Mother    Hypertension Father  Cancer Father    Breast cancer Maternal Aunt    Breast cancer Maternal Aunt      Current Outpatient Medications:    chlorthalidone  (HYGROTON ) 50 MG tablet, Take 0.5 tablets (25 mg total) by mouth daily., Disp: 90 tablet, Rfl: 1   fluticasone  (FLONASE ) 50 MCG/ACT nasal spray, Place 1 spray into both nostrils 2 (two) times daily., Disp: 16 g, Rfl: 2   Multiple Vitamins-Minerals (MULTIVITAMIN WITH MINERALS) tablet, Take 1 tablet by mouth daily., Disp: , Rfl:    escitalopram  (LEXAPRO ) 10 MG tablet, TAKE 1 TABLET(10 MG) BY MOUTH DAILY (Patient not taking: Reported on 10/01/2023), Disp: 60 tablet, Rfl: 1   escitalopram  (LEXAPRO ) 20 MG tablet, Take 1 tablet (20 mg total) by mouth daily., Disp: 90 tablet, Rfl: 1   rosuvastatin  (CRESTOR ) 20 MG tablet, Take 1 tablet (20 mg total) by mouth daily. (Patient not taking:  Reported on 10/01/2023), Disp: 90 tablet, Rfl: 3   tiZANidine  (ZANAFLEX ) 2 MG tablet, Take 1 tablet (2 mg total) by mouth every 8 (eight) hours as needed for muscle spasms. Do not take with alcohol or while driving or operating heavy machinery.  May cause drowsiness. (Patient not taking: Reported on 10/01/2023), Disp: 20 tablet, Rfl: 0   Physical exam: There were no vitals filed for this visit. Physical Exam Cardiovascular:     Rate and Rhythm: Normal rate and regular rhythm.     Heart sounds: Normal heart sounds.  Pulmonary:     Effort: Pulmonary effort is normal.     Breath sounds: Normal breath sounds.  Abdominal:     General: Bowel sounds are normal.     Palpations: Abdomen is soft.  Musculoskeletal:     Comments: Joint deformities in bilateral hands  Skin:    General: Skin is warm and dry.     Comments: 3 to 4 mm circular areas of bruising which appear in various stages of healing noted over bilateral legs  Neurological:     Mental Status: She is alert and oriented to person, place, and time.           Latest Ref Rng & Units 08/13/2022    8:20 PM  CMP  Glucose 70 - 99 mg/dL 874   BUN 6 - 20 mg/dL 12   Creatinine 9.55 - 1.00 mg/dL 8.90   Sodium 864 - 854 mmol/L 134   Potassium 3.5 - 5.1 mmol/L 3.3   Chloride 98 - 111 mmol/L 94   CO2 22 - 32 mmol/L 31   Calcium  8.9 - 10.3 mg/dL 8.6   Total Protein 6.5 - 8.1 g/dL 8.1   Total Bilirubin 0.3 - 1.2 mg/dL 0.5   Alkaline Phos 38 - 126 U/L 96   AST 15 - 41 U/L 54   ALT 0 - 44 U/L 46       Latest Ref Rng & Units 08/13/2022    8:20 PM  CBC  WBC 4.0 - 10.5 K/uL 13.6   Hemoglobin 12.0 - 15.0 g/dL 87.3   Hematocrit 63.9 - 46.0 % 38.2   Platelets 150 - 400 K/uL 383     No images are attached to the encounter.  MM 3D DIAGNOSTIC MAMMOGRAM UNILATERAL RIGHT BREAST Result Date: 09/07/2023 CLINICAL DATA:  Recall for possible RIGHT breast asymmetry EXAM: DIGITAL DIAGNOSTIC UNILATERAL RIGHT MAMMOGRAM WITH TOMOSYNTHESIS AND CAD  TECHNIQUE: Right digital diagnostic mammography and breast tomosynthesis was performed. The images were evaluated with computer-aided detection. COMPARISON:  Previous exam(s). ACR Breast Density Category d: The  breasts are extremely dense, which lowers the sensitivity of mammography. FINDINGS: RIGHT: Mammogram: Spot-compression MLO and full field ML views of the RIGHT breast were obtained. Suspected asymmetry in the upper RIGHT breast does not persist on additional images. IMPRESSION: No evidence of RIGHT breast malignancy. RECOMMENDATION: 1.  Screening mammogram in one year.(Code:SM-B-01Y) 2. Patient has extreme breast density. Supplemental screening with breast ultrasound (where available) or breast MRI with and without contrast/abbreviated breast MRI could be considered as clinically warranted. I have discussed the findings and recommendations with the patient. If applicable, a reminder letter will be sent to the patient regarding the next appointment. BI-RADS CATEGORY  1: Negative. Electronically Signed   By: Aliene Lloyd M.D.   On: 09/07/2023 14:38    Assessment and plan- Patient is a 48 y.o. female referred for leukocytosis/neutrophilia  Neutrophilia could be multifactorial secondary to underlying autoimmune rheumatoid arthritis, intermittent use of steroids as well as smoking.  It is very likely reactive.  I am however getting peripheral flow cytometry and BCR-ABL FISH testing today  Patient has mild normocytic anemia and I will check iron studies B12 and folate today  Easy bruising: Will check PT PTT INR and von Willebrand panel testing  In person or video visit with me in 2 weeks time   Thank you for this kind referral and the opportunity to participate in the care of this patient   Visit Diagnosis 1. Neutrophilia   2. Normocytic anemia   3. Easy bruising     Dr. Annah Skene, MD, MPH [facility] [phone] 10/01/2023

## 2023-10-03 LAB — VON WILLEBRAND PANEL
Coagulation Factor VIII: 142 % — ABNORMAL HIGH (ref 56–140)
Ristocetin Co-factor, Plasma: 111 % (ref 50–200)
Von Willebrand Antigen, Plasma: 173 % (ref 50–200)

## 2023-10-03 LAB — COAG STUDIES INTERP REPORT

## 2023-10-05 LAB — COMP PANEL: LEUKEMIA/LYMPHOMA

## 2023-10-11 LAB — BCR-ABL1 FISH
Cells Analyzed: 200
Cells Counted: 200

## 2023-10-14 ENCOUNTER — Inpatient Hospital Stay: Admitting: Oncology

## 2023-10-14 ENCOUNTER — Other Ambulatory Visit: Payer: Self-pay

## 2023-10-14 DIAGNOSIS — D508 Other iron deficiency anemias: Secondary | ICD-10-CM

## 2023-10-14 DIAGNOSIS — D72828 Other elevated white blood cell count: Secondary | ICD-10-CM

## 2023-10-14 DIAGNOSIS — D649 Anemia, unspecified: Secondary | ICD-10-CM

## 2023-10-14 NOTE — Progress Notes (Signed)
 Pain 7/10 hands and feet, back and shoulders (chronic); RA.  Usually takes goody powders and ibuprofen .  Needs to call Rheumo and schedule appointment.  Trouble sleeping due to pain.  Sometimes her chest hurts, last occurrence yesterday while cleaning.  numbness and tingling to hands and feet (chronic but feels like a flare up recently).

## 2023-10-25 NOTE — Progress Notes (Signed)
 I connected with Linda Calderon on 10/25/23 at 10:30 AM EDT by video enabled telemedicine visit and verified that I am speaking with the correct person using two identifiers.   I discussed the limitations, risks, security and privacy concerns of performing an evaluation and management service by telemedicine and the availability of in-person appointments. I also discussed with the patient that there may be a patient responsible charge related to this service. The patient expressed understanding and agreed to proceed.  Other persons participating in the visit and their role in the encounter:  none  Patient's location:  home Provider's location:  home  Chief Complaint:  discuss results of bloodwork  History of present illness: Patient is a 48 year old female with a past medical history significant for rheumatoid arthritis hypertension hyperlipidemia PTSD and anxiety referred for leukocytosis.  Labs from 09/17/2023 showed white count of 11.8, H&H of 11/34.8 with an MCV of 94.6 and a platelet count of 447.  Differential mainly showed neutrophilia.  BMP was within normal limits.  Looking back at her prior CBCs patient's white count was 12.5 in February 2025 with differential mainly showing neutrophilia and relative lymphopenia and monocytopenia   Patient has significant joint pain from her rheumatoid arthritis.  She reports having occasional bruising especially over her bilateral legs which come and go without any apparent trauma.  Results of blood work from 10/01/2023 were as follows: CBC showed white cell count of 13.4, H&H of 12.7/40 with a platelet count of 473.  Flow cytometry did not show any immunophenotypic abnormality.  BCR-ABL FISH testing negative.  Ferritin levels were low at 13.  Iron saturation low at 10% and TIBC elevated at 522.  PT PTT INR were within normal limits.  Von Willebrand panel testing showed mild elevation of Factor VIII at 142 but otherwise no evidence of von Willebrand disease  based on labs..  B12 levels normal at 513  Interval history she reports ongoing fatigue as well as symptoms of joint pain.  No other acute issues since last visit   Review of Systems  Constitutional:  Positive for malaise/fatigue. Negative for chills, fever and weight loss.  HENT:  Negative for congestion, ear discharge and nosebleeds.   Eyes:  Negative for blurred vision.  Respiratory:  Negative for cough, hemoptysis, sputum production, shortness of breath and wheezing.   Cardiovascular:  Negative for chest pain, palpitations, orthopnea and claudication.  Gastrointestinal:  Negative for abdominal pain, blood in stool, constipation, diarrhea, heartburn, melena, nausea and vomiting.  Genitourinary:  Negative for dysuria, flank pain, frequency, hematuria and urgency.  Musculoskeletal:  Positive for joint pain. Negative for back pain and myalgias.  Skin:  Negative for rash.  Neurological:  Negative for dizziness, tingling, focal weakness, seizures, weakness and headaches.  Endo/Heme/Allergies:  Does not bruise/bleed easily.  Psychiatric/Behavioral:  Negative for depression and suicidal ideas. The patient does not have insomnia.     Allergies  Allergen Reactions   Morphine And Codeine      Dropped blood pressure, had to lay patient flat and administer IV    Past Medical History:  Diagnosis Date   ADD (attention deficit disorder)    Arthritis    Chronic neck and back pain    Pneumonia    PTSD (post-traumatic stress disorder)    Radiculopathy    UTI (lower urinary tract infection)     Past Surgical History:  Procedure Laterality Date   ABDOMINAL HYSTERECTOMY     TUBAL LIGATION     VIDEO BRONCHOSCOPY N/A 08/18/2019  Procedure: FLEXIBLE BRONCHOSCOPY WITH BRONCHOALVEOLAR LAVAGE;  Surgeon: Geronimo Amel, MD;  Location: [facility];  Service: Cardiopulmonary;  Laterality: N/A;    Social History   Socioeconomic History   Marital status: Significant Other    Spouse name: Selinda Croak   Number of children: 1   Years of education: Not on file   Highest education level: 11th grade  Occupational History   Not on file  Tobacco Use   Smoking status: Every Day    Current packs/day: 0.50    Average packs/day: 0.5 packs/day for 10.0 years (5.0 ttl pk-yrs)    Types: Cigarettes   Smokeless tobacco: Never  Vaping Use   Vaping status: Never Used  Substance and Sexual Activity   Alcohol use: Yes    Comment: occasionally   Drug use: Yes    Frequency: 5.0 times per week    Types: Marijuana   Sexual activity: Yes    Birth control/protection: Surgical  Other Topics Concern   Not on file  Social History Narrative   Not on file   Social Drivers of Health   Financial Resource Strain: Medium Risk (05/24/2023)   Received from [facility]   Overall Financial Resource Strain (CARDIA)    Difficulty of Paying Living Expenses: Somewhat hard  Food Insecurity: No Food Insecurity (10/01/2023)   Hunger Vital Sign    Worried About Running Out of Food in the Last Year: Never true    Ran Out of Food in the Last Year: Never true  Transportation Needs: No Transportation Needs (10/01/2023)   PRAPARE - Administrator, Civil Service (Medical): No    Lack of Transportation (Non-Medical): No  Physical Activity: Inactive (03/08/2023)   Received from [facility]   Exercise Vital Sign    On average, how many days per week do you engage in moderate to strenuous exercise (like a brisk walk)?: 0 days    On average, how many minutes do you engage in exercise at this level?: 0 min  Stress: Stress Concern Present (03/08/2023)   Received from [facility] - Occupational Stress Questionnaire    Feeling of Stress : Very much  Social Connections: Socially Isolated (03/08/2023)   Received from [facility]   Social Connection and Isolation Panel    In a typical week, how many  times do you talk on the phone with family, friends, or neighbors?: Once a week    How often do you get together with friends or relatives?: Never    How often do you attend church or religious services?: 1 to 4 times per year    Do you belong to any clubs or organizations such as church groups, unions, fraternal or athletic groups, or school groups?: No    How often do you attend meetings of the clubs or organizations you belong to?: Never    Are you married, widowed, divorced, separated, never married, or living with a partner?: Divorced  Intimate Partner Violence: Not At Risk (10/01/2023)   Humiliation, Afraid, Rape, and Kick questionnaire    Fear of Current or Ex-Partner: No    Emotionally Abused: No    Physically Abused: No    Sexually Abused: No    Family History  Problem Relation Age of Onset   Hypertension Mother    Hypertension Father    Cancer Father    Drug abuse Brother    Breast cancer Maternal  Aunt    Breast cancer Maternal Aunt      Current Outpatient Medications:    budesonide-formoterol (SYMBICORT) 160-4.5 MCG/ACT inhaler, Inhale 2 puffs into the lungs., Disp: , Rfl:    chlorthalidone  (HYGROTON ) 50 MG tablet, Take 0.5 tablets (25 mg total) by mouth daily., Disp: 90 tablet, Rfl: 1   escitalopram  (LEXAPRO ) 10 MG tablet, TAKE 1 TABLET(10 MG) BY MOUTH DAILY (Patient not taking: Reported on 10/01/2023), Disp: 60 tablet, Rfl: 1   escitalopram  (LEXAPRO ) 20 MG tablet, Take 1 tablet (20 mg total) by mouth daily., Disp: 90 tablet, Rfl: 1   fluticasone  (FLONASE ) 50 MCG/ACT nasal spray, Place 1 spray into both nostrils 2 (two) times daily., Disp: 16 g, Rfl: 2   Multiple Vitamins-Minerals (MULTIVITAMIN WITH MINERALS) tablet, Take 1 tablet by mouth daily., Disp: , Rfl:    rosuvastatin  (CRESTOR ) 20 MG tablet, Take 1 tablet (20 mg total) by mouth daily. (Patient not taking: Reported on 10/01/2023), Disp: 90 tablet, Rfl: 3   tiZANidine  (ZANAFLEX ) 2 MG tablet, Take 1 tablet (2 mg total)  by mouth every 8 (eight) hours as needed for muscle spasms. Do not take with alcohol or while driving or operating heavy machinery.  May cause drowsiness. (Patient not taking: Reported on 10/01/2023), Disp: 20 tablet, Rfl: 0  No results found.  No images are attached to the encounter.      Latest Ref Rng & Units 08/13/2022    8:20 PM  CMP  Glucose 70 - 99 mg/dL 874   BUN 6 - 20 mg/dL 12   Creatinine 9.55 - 1.00 mg/dL 8.90   Sodium 864 - 854 mmol/L 134   Potassium 3.5 - 5.1 mmol/L 3.3   Chloride 98 - 111 mmol/L 94   CO2 22 - 32 mmol/L 31   Calcium  8.9 - 10.3 mg/dL 8.6   Total Protein 6.5 - 8.1 g/dL 8.1   Total Bilirubin 0.3 - 1.2 mg/dL 0.5   Alkaline Phos 38 - 126 U/L 96   AST 15 - 41 U/L 54   ALT 0 - 44 U/L 46       Latest Ref Rng & Units 10/01/2023    2:34 PM  CBC  WBC 4.0 - 10.5 K/uL 13.4   Hemoglobin 12.0 - 15.0 g/dL 87.2   Hematocrit 63.9 - 46.0 % 40.0   Platelets 150 - 400 K/uL 473      Observation/objective: Appears in no acute distress over video visit today.  Breathing is nonlabored  Assessment and plan: Patient is a 49 year old female referred for leukocytosis and this is a visit to discuss results of Blood work  Discussed results of blood work from 10/09/2023 which showed white cell count was mildly elevated at 13.4 predominantly with neutrophilia.  Flow cytometry did not show any evidence of immunophenotypic abnormality.  Suspect neutrophilia and thrombocytosis is reactive secondary to underlying rheumatoid arthritis and can be monitored conservatively without the need for bone marrow biopsy.  BCR-ABL FISH testing was also negative.  Patient was found to have normal H&H of 12.7/40 but her ferritin levels were low at 13 with an iron saturation of 10% indicative of iron deficiency.  B12 levels were normal at 513.  Given her ongoing fatigue we discussed consideration for IV iron.  Discussed risks and benefits of IV iron including all but not limited to possible risk of  infusion and anaphylactic reaction.  Patient understands and agrees to proceed as planned.  I will get CBC ferritin and iron studies in 3  and 6 months and I will see her back in 6 months.  I will also refer her to [facility] for her iron deficiency anemia.  Patient was also complaining of easy bruising.  However PT PTT INR and von Willebrand panel testing did not show any evidence of bleeding disorder.  Continue to monitor  Follow-up instructions: As above  I discussed the assessment and treatment plan with the patient. The patient was provided an opportunity to ask questions and all were answered. The patient agreed with the plan and demonstrated an understanding of the instructions.   The patient was advised to call back or seek an in-person evaluation if the symptoms worsen or if the condition fails to improve as anticipated.  I provided 14 minutes of face-to-face video visit time during this encounter, and > 50% was spent counseling as documented under my assessment & plan.  Visit Diagnosis: 1. Other iron deficiency anemia   2. Neutrophilia     Dr. Annah Skene, MD, MPH [facility] Tel- [phone] 10/25/2023 12:48 PM

## 2023-10-27 ENCOUNTER — Other Ambulatory Visit: Payer: Self-pay | Admitting: Oncology

## 2023-10-27 DIAGNOSIS — D509 Iron deficiency anemia, unspecified: Secondary | ICD-10-CM | POA: Insufficient documentation

## 2023-10-27 DIAGNOSIS — D508 Other iron deficiency anemias: Secondary | ICD-10-CM

## 2023-10-29 ENCOUNTER — Telehealth: Payer: Self-pay | Admitting: *Deleted

## 2023-10-29 NOTE — Telephone Encounter (Signed)
 Called and wanted to know on that Monday appointment of 1013 at 2 PM what kind of appointment is it and it is for her IV iron infusion.  Also has another 1 on 1020 at 2:00 then there is 1027 at 2:00 then there is November 3 at 215 and 1110 at 2 PM and that is all of them for IV iron.  I called the patient back I got her voicemail and I stated all of the dates and times for all the IV iron.  Told her if she got my message and she had some questions she can call back

## 2023-11-01 ENCOUNTER — Encounter: Payer: Self-pay | Admitting: Oncology

## 2023-11-01 ENCOUNTER — Inpatient Hospital Stay: Attending: Oncology

## 2023-11-01 VITALS — BP 132/96 | HR 80 | Temp 98.7°F | Resp 18

## 2023-11-01 DIAGNOSIS — D508 Other iron deficiency anemias: Secondary | ICD-10-CM

## 2023-11-01 DIAGNOSIS — D649 Anemia, unspecified: Secondary | ICD-10-CM | POA: Insufficient documentation

## 2023-11-01 MED ORDER — SODIUM CHLORIDE 0.9% FLUSH
10.0000 mL | Freq: Once | INTRAVENOUS | Status: AC | PRN
  Administered 2023-11-01: 10 mL
  Filled 2023-11-01: qty 10

## 2023-11-01 MED ORDER — IRON SUCROSE 20 MG/ML IV SOLN
200.0000 mg | INTRAVENOUS | Status: DC
  Administered 2023-11-01: 200 mg via INTRAVENOUS
  Filled 2023-11-01: qty 10

## 2023-11-01 NOTE — Patient Instructions (Signed)

## 2023-11-08 ENCOUNTER — Telehealth: Payer: Self-pay | Admitting: Oncology

## 2023-11-08 ENCOUNTER — Ambulatory Visit
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
## 2023-11-15 ENCOUNTER — Inpatient Hospital Stay

## 2023-11-22 ENCOUNTER — Inpatient Hospital Stay: Attending: Oncology

## 2023-11-22 VITALS — BP 132/89 | HR 81 | Temp 97.7°F | Resp 16

## 2023-11-22 DIAGNOSIS — D508 Other iron deficiency anemias: Secondary | ICD-10-CM

## 2023-11-22 DIAGNOSIS — D649 Anemia, unspecified: Secondary | ICD-10-CM | POA: Insufficient documentation

## 2023-11-22 MED ORDER — IRON SUCROSE 20 MG/ML IV SOLN
200.0000 mg | INTRAVENOUS | Status: DC
  Administered 2023-11-22: 200 mg via INTRAVENOUS
  Filled 2023-11-22: qty 10

## 2023-11-22 NOTE — Progress Notes (Signed)
 Patient tolerated Venofer without any complications. She declined staying for 30 minute observation period. Informed patient to go to ED if she experienced any signs of an allergic reaction.

## 2023-11-29 ENCOUNTER — Inpatient Hospital Stay

## 2023-11-29 VITALS — BP 132/94 | HR 78 | Temp 97.6°F

## 2023-11-29 DIAGNOSIS — D508 Other iron deficiency anemias: Secondary | ICD-10-CM

## 2023-11-29 DIAGNOSIS — D649 Anemia, unspecified: Secondary | ICD-10-CM | POA: Diagnosis not present

## 2023-11-29 MED ORDER — IRON SUCROSE 20 MG/ML IV SOLN
200.0000 mg | INTRAVENOUS | Status: DC
  Administered 2023-11-29: 200 mg via INTRAVENOUS
  Filled 2023-11-29: qty 10

## 2023-11-29 NOTE — Progress Notes (Signed)
 Declined post-observation. Aware of risks. Vitals stable at discharge.

## 2023-11-29 NOTE — Patient Instructions (Signed)

## 2023-12-06 ENCOUNTER — Inpatient Hospital Stay

## 2023-12-07 ENCOUNTER — Emergency Department (HOSPITAL_COMMUNITY)
Admission: EM | Admit: 2023-12-07 | Discharge: 2023-12-08 | Disposition: A | Discharge status: Home / Self Care | Attending: Emergency Medicine | Admitting: Emergency Medicine

## 2023-12-07 ENCOUNTER — Encounter (HOSPITAL_COMMUNITY): Payer: Self-pay

## 2023-12-07 ENCOUNTER — Emergency Department (HOSPITAL_COMMUNITY)

## 2023-12-07 ENCOUNTER — Other Ambulatory Visit: Payer: Self-pay

## 2023-12-07 DIAGNOSIS — R0789 Other chest pain: Secondary | ICD-10-CM | POA: Insufficient documentation

## 2023-12-07 LAB — CBC
HCT: 37 % (ref 36.0–46.0)
Hemoglobin: 12.2 g/dL (ref 12.0–15.0)
MCH: 31.5 pg (ref 26.0–34.0)
MCHC: 33 g/dL (ref 30.0–36.0)
MCV: 95.6 fL (ref 80.0–100.0)
Platelets: 388 K/uL (ref 150–400)
RBC: 3.87 MIL/uL (ref 3.87–5.11)
RDW: 17 % — ABNORMAL HIGH (ref 11.5–15.5)
WBC: 9.6 K/uL (ref 4.0–10.5)
nRBC: 0 % (ref 0.0–0.2)

## 2023-12-07 LAB — COMPREHENSIVE METABOLIC PANEL WITH GFR
ALT: 171 U/L — ABNORMAL HIGH (ref 0–44)
AST: 160 U/L — ABNORMAL HIGH (ref 15–41)
Albumin: 4.1 g/dL (ref 3.5–5.0)
Alkaline Phosphatase: 119 U/L (ref 38–126)
Anion gap: 12 (ref 5–15)
BUN: 13 mg/dL (ref 6–20)
CO2: 24 mmol/L (ref 22–32)
Calcium: 8.9 mg/dL (ref 8.9–10.3)
Chloride: 104 mmol/L (ref 98–111)
Creatinine, Ser: 0.72 mg/dL (ref 0.44–1.00)
GFR, Estimated: >60 mL/min (ref >60)
Glucose, Bld: 98 mg/dL (ref 70–99)
Potassium: 4 mmol/L (ref 3.5–5.1)
Sodium: 139 mmol/L (ref 135–145)
Total Bilirubin: <0.2 mg/dL (ref 0.0–1.2)
Total Protein: 7.4 g/dL (ref 6.5–8.1)

## 2023-12-07 LAB — TROPONIN T, HIGH SENSITIVITY: Troponin T High Sensitivity: <15 ng/L (ref 0–19)

## 2023-12-07 NOTE — Discharge Instructions (Signed)
 Begin taking Prilosec 20 mg twice daily for the next 2 weeks.  Follow-up with primary doctor if not improving, and return to the ER if you develop worsening pain, difficulty breathing, or for other new and concerning symptoms.

## 2023-12-07 NOTE — ED Triage Notes (Signed)
 Pt with left side chest pain, nausea and hot flashes since yesterday.

## 2023-12-07 NOTE — ED Provider Notes (Signed)
 [place] EMERGENCY DEPARTMENT AT [facility] Provider Note   CSN: [id number] Arrival date & time: 12/07/23  2206     Patient presents with: Chest Pain   Linda Calderon is a 48 y.o. female.   Patient is a 48 year old female with history of PTSD, ADHD rheumatoid arthritis.  Patient complains of chest discomfort.  She describes a burning in her chest worsening since yesterday evening.  She does report being under significant stress recently.  No shortness of breath, nausea, or diaphoresis.  She denies any fevers or chills.  No recent exertional symptoms.  Patient denies any prior cardiac history.       Prior to Admission medications   Medication Sig Start Date End Date Taking? Authorizing Provider  budesonide-formoterol (SYMBICORT) 160-4.5 MCG/ACT inhaler Inhale 2 puffs into the lungs. 07/05/23 07/04/24  [provider]  chlorthalidone  (HYGROTON ) 50 MG tablet Take 0.5 tablets (25 mg total) by mouth daily. 07/21/22   Kayla Jeoffrey RAMAN, FNP  escitalopram  (LEXAPRO ) 10 MG tablet TAKE 1 TABLET(10 MG) BY MOUTH DAILY Patient not taking: Reported on 10/01/2023 05/06/23   Duanne Butler DASEN, MD  escitalopram  (LEXAPRO ) 20 MG tablet Take 1 tablet (20 mg total) by mouth daily. 08/19/22   Kayla Jeoffrey RAMAN, FNP  fluticasone  (FLONASE ) 50 MCG/ACT nasal spray Place 1 spray into both nostrils 2 (two) times daily. 12/04/22   Stuart Vernell Norris, PA-C  Multiple Vitamins-Minerals (MULTIVITAMIN WITH MINERALS) tablet Take 1 tablet by mouth daily.    [provider]  predniSONE  (DELTASONE ) 10 MG tablet Take 10 mg by mouth daily. 10/21/23   [provider]  rosuvastatin  (CRESTOR ) 20 MG tablet Take 1 tablet (20 mg total) by mouth daily. Patient not taking: Reported on 10/01/2023 07/21/22   Kayla Jeoffrey RAMAN, FNP  tiZANidine  (ZANAFLEX ) 2 MG tablet Take 1 tablet (2 mg total) by mouth every 8 (eight) hours as needed for muscle spasms. Do not take with alcohol or while driving or operating  heavy machinery.  May cause drowsiness. Patient not taking: Reported on 10/01/2023 12/25/22   Chandra Harlene LABOR, NP    Allergies: Morphine and codeine     Review of Systems  All other systems reviewed and are negative.   Updated Vital Signs BP (!) 153/99 (BP Location: Left Arm)   Pulse 74   Temp 98.1 F (36.7 C) (Oral)   Resp 16   Wt 48.1 kg   LMP 03/09/2011   SpO2 98%   BMI 18.19 kg/m   Physical Exam Vitals and nursing note reviewed.  Constitutional:      General: She is not in acute distress.    Appearance: She is well-developed. She is not diaphoretic.  HENT:     Head: Normocephalic and atraumatic.  Cardiovascular:     Rate and Rhythm: Normal rate and regular rhythm.     Heart sounds: No murmur heard.    No friction rub. No gallop.  Pulmonary:     Effort: Pulmonary effort is normal. No respiratory distress.     Breath sounds: Normal breath sounds. No wheezing.  Abdominal:     General: Bowel sounds are normal. There is no distension.     Palpations: Abdomen is soft.     Tenderness: There is no abdominal tenderness.  Musculoskeletal:        General: Normal range of motion.     Cervical back: Normal range of motion and neck supple.  Skin:    General: Skin is warm and dry.  Neurological:  General: No focal deficit present.     Mental Status: She is alert and oriented to person, place, and time.     (all labs ordered are listed, but only abnormal results are displayed) Labs Reviewed  CBC - Abnormal; Notable for the following components:      Result Value   RDW 17.0 (*)    All other components within normal limits  COMPREHENSIVE METABOLIC PANEL WITH GFR - Abnormal; Notable for the following components:   AST 160 (*)    ALT 171 (*)    All other components within normal limits  TROPONIN T, HIGH SENSITIVITY    EKG: None  Radiology: DG Chest 2 View Result Date: 12/07/2023 CLINICAL DATA:  Chest pain EXAM: CHEST - 2 VIEW COMPARISON:  Chest x-ray  06/12/2022 FINDINGS: Right lower lung likely nipple shadow is again noted. The lungs are otherwise appear clear. No pleural effusion or pneumothorax. Cardiomediastinal silhouette is within normal limits. No acute fractures are seen. IMPRESSION: No active cardiopulmonary disease. Electronically Signed   By: Greig Pique M.D.   On: 12/07/2023 22:46     Procedures   Medications Ordered in the ED - No data to display                                  Medical Decision Making Amount and/or Complexity of Data Reviewed Labs: ordered. Radiology: ordered.   Patient is a 48 year old female presenting with burning in her chest as described in the HPI.  Patient arrives here with stable vital signs and is afebrile.  Physical examination is basically unremarkable.  Laboratory studies obtained including CBC, CMP, and troponin, all of which are unremarkable.  Chest x-ray is clear.  At this point, I am doubting a cardiac etiology.  She has a negative troponin and unchanged EKG with over 24 hours worth of symptoms.  She does describe a burning and excessive stress recently.  I suspect a GI etiology.  I will start the patient on Prilosec and have her take this for the next 2 weeks.  She is to follow-up with primary doctor and return to the ER if symptoms worsen or change in the meantime.     Final diagnoses:  None    ED Discharge Orders     None          Geroldine Berg, MD 12/07/23 [phone]

## 2023-12-13 ENCOUNTER — Inpatient Hospital Stay

## 2023-12-20 ENCOUNTER — Inpatient Hospital Stay

## 2023-12-27 ENCOUNTER — Inpatient Hospital Stay: Attending: Oncology

## 2024-01-03 ENCOUNTER — Inpatient Hospital Stay

## 2024-01-07 ENCOUNTER — Inpatient Hospital Stay

## 2024-04-12 ENCOUNTER — Other Ambulatory Visit

## 2024-04-12 ENCOUNTER — Ambulatory Visit: Admitting: Oncology
